# Patient Record
Sex: Male | Born: 1965 | Race: Black or African American | Hispanic: No | Marital: Married | Smoking: Current every day smoker
Health system: Southern US, Community
[De-identification: ages and names within clinical notes are randomized; demographics above are authoritative.]

## PROBLEM LIST (undated history)

## (undated) DIAGNOSIS — I509 Heart failure, unspecified: Secondary | ICD-10-CM

## (undated) DIAGNOSIS — I428 Other cardiomyopathies: Secondary | ICD-10-CM

## (undated) DIAGNOSIS — I10 Essential (primary) hypertension: Secondary | ICD-10-CM

## (undated) DIAGNOSIS — Z8249 Family history of ischemic heart disease and other diseases of the circulatory system: Secondary | ICD-10-CM

## (undated) DIAGNOSIS — E669 Obesity, unspecified: Secondary | ICD-10-CM

## (undated) HISTORY — PX: OTHER SURGICAL HISTORY: SHX169

## (undated) HISTORY — DX: Heart failure, unspecified: I50.9

---

## 2014-03-30 ENCOUNTER — Encounter (HOSPITAL_COMMUNITY): Payer: Self-pay | Admitting: Emergency Medicine

## 2014-03-30 ENCOUNTER — Emergency Department (HOSPITAL_COMMUNITY): Payer: BLUE CROSS/BLUE SHIELD

## 2014-03-30 ENCOUNTER — Emergency Department (HOSPITAL_COMMUNITY)
Admission: EM | Admit: 2014-03-30 | Discharge: 2014-03-30 | Disposition: A | Discharge status: Home / Self Care | Payer: BLUE CROSS/BLUE SHIELD | Attending: Emergency Medicine | Admitting: Emergency Medicine

## 2014-03-30 DIAGNOSIS — I10 Essential (primary) hypertension: Secondary | ICD-10-CM | POA: Insufficient documentation

## 2014-03-30 DIAGNOSIS — R05 Cough: Secondary | ICD-10-CM | POA: Diagnosis present

## 2014-03-30 DIAGNOSIS — Z720 Tobacco use: Secondary | ICD-10-CM | POA: Diagnosis not present

## 2014-03-30 DIAGNOSIS — J159 Unspecified bacterial pneumonia: Secondary | ICD-10-CM | POA: Diagnosis not present

## 2014-03-30 DIAGNOSIS — J189 Pneumonia, unspecified organism: Secondary | ICD-10-CM

## 2014-03-30 LAB — CBC WITH DIFFERENTIAL/PLATELET
Basophils Absolute: 0 10*3/uL (ref 0.0–0.1)
Basophils Relative: 1 % (ref 0–1)
EOS ABS: 0 10*3/uL (ref 0.0–0.7)
EOS PCT: 0 % (ref 0–5)
HEMATOCRIT: 52.9 % — AB (ref 39.0–52.0)
Hemoglobin: 18.9 g/dL — ABNORMAL HIGH (ref 13.0–17.0)
LYMPHS ABS: 1.1 10*3/uL (ref 0.7–4.0)
Lymphocytes Relative: 28 % (ref 12–46)
MCH: 29.2 pg (ref 26.0–34.0)
MCHC: 35.7 g/dL (ref 30.0–36.0)
MCV: 81.6 fL (ref 78.0–100.0)
MONOS PCT: 12 % (ref 3–12)
Monocytes Absolute: 0.5 10*3/uL (ref 0.1–1.0)
Neutro Abs: 2.4 10*3/uL (ref 1.7–7.7)
Neutrophils Relative %: 60 % (ref 43–77)
PLATELETS: 129 10*3/uL — AB (ref 150–400)
RBC: 6.48 MIL/uL — AB (ref 4.22–5.81)
RDW: 14 % (ref 11.5–15.5)
WBC: 4 10*3/uL (ref 4.0–10.5)

## 2014-03-30 LAB — BASIC METABOLIC PANEL
ANION GAP: 10 (ref 5–15)
BUN: 22 mg/dL (ref 6–23)
CHLORIDE: 98 mmol/L (ref 96–112)
CO2: 21 mmol/L (ref 19–32)
Calcium: 8.1 mg/dL — ABNORMAL LOW (ref 8.4–10.5)
Creatinine, Ser: 1.3 mg/dL (ref 0.50–1.35)
GFR calc non Af Amer: 64 mL/min — ABNORMAL LOW (ref >90)
GFR, EST AFRICAN AMERICAN: 74 mL/min — AB (ref >90)
Glucose, Bld: 108 mg/dL — ABNORMAL HIGH (ref 70–99)
POTASSIUM: 5.3 mmol/L — AB (ref 3.5–5.1)
Sodium: 129 mmol/L — ABNORMAL LOW (ref 135–145)

## 2014-03-30 LAB — PROTIME-INR
INR: 0.97 (ref 0.00–1.49)
PROTHROMBIN TIME: 13 s (ref 11.6–15.2)

## 2014-03-30 MED ORDER — GUAIFENESIN-CODEINE 100-10 MG/5ML PO SOLN
10.0000 mL | Freq: Once | ORAL | Status: AC
  Administered 2014-03-30: 10 mL via ORAL
  Filled 2014-03-30: qty 10

## 2014-03-30 MED ORDER — IBUPROFEN 800 MG PO TABS: 800.0000 mg | ORAL_TABLET | Freq: Three times a day (TID) | ORAL | Status: DC | PRN

## 2014-03-30 MED ORDER — IPRATROPIUM-ALBUTEROL 0.5-2.5 (3) MG/3ML IN SOLN
3.0000 mL | Freq: Once | RESPIRATORY_TRACT | Status: AC
  Administered 2014-03-30: 3 mL via RESPIRATORY_TRACT
  Filled 2014-03-30: qty 3

## 2014-03-30 MED ORDER — LEVOFLOXACIN IN D5W 500 MG/100ML IV SOLN
500.0000 mg | Freq: Once | INTRAVENOUS | Status: AC
  Administered 2014-03-30: 500 mg via INTRAVENOUS
  Filled 2014-03-30: qty 100

## 2014-03-30 MED ORDER — KETOROLAC TROMETHAMINE 30 MG/ML IJ SOLN
30.0000 mg | Freq: Once | INTRAMUSCULAR | Status: AC
  Administered 2014-03-30: 30 mg via INTRAVENOUS
  Filled 2014-03-30: qty 1

## 2014-03-30 MED ORDER — LOPERAMIDE HCL 2 MG PO CAPS
4.0000 mg | ORAL_CAPSULE | Freq: Once | ORAL | Status: DC
  Filled 2014-03-30: qty 2

## 2014-03-30 MED ORDER — SODIUM CHLORIDE 0.9 % IV BOLUS (SEPSIS)
1000.0000 mL | Freq: Once | INTRAVENOUS | Status: AC
  Administered 2014-03-30: 1000 mL via INTRAVENOUS

## 2014-03-30 MED ORDER — LEVOFLOXACIN 500 MG PO TABS: 500.0000 mg | ORAL_TABLET | Freq: Every day | ORAL | Status: DC

## 2014-03-30 MED ORDER — GUAIFENESIN-CODEINE 100-10 MG/5ML PO SOLN: 10.0000 mL | Freq: Four times a day (QID) | ORAL | Status: DC | PRN

## 2014-03-30 NOTE — ED Provider Notes (Signed)
TIME SEEN: 12:15 PM  CHIEF COMPLAINT: Subjective fever, chills, cough, diarrhea, body aches  HPI: Pt is a 49 y.o. male with history of hypertension, tobacco use who presents to the emergency department with 5 days of fever, cough that is blood streaked, diarrhea, body aches. States he is from [place] and has been here for the past 3 weeks. No recent international travel. No known sick contacts. Has had decreased appetite but no nausea or vomiting. Did not have an influenza vaccination. States today he had back pain with coughing and some mild shortness of breath. Does not work oxygen at home.  ROS: See HPI Constitutional:  fever  Eyes: no drainage  ENT: no runny nose   Cardiovascular:  no chest pain  Resp:  SOB  GI: no vomiting GU: no dysuria Integumentary: no rash  Allergy: no hives  Musculoskeletal: no leg swelling  Neurological: no slurred speech ROS otherwise negative  PAST MEDICAL HISTORY/PAST SURGICAL HISTORY:  Past Medical History  Diagnosis Date  . Hypertension     MEDICATIONS:  Prior to Admission medications   Not on File    ALLERGIES:  No Known Allergies  SOCIAL HISTORY:  History  Substance Use Topics  . Smoking status: Current Every Day Smoker  . Smokeless tobacco: Not on file  . Alcohol Use: No    FAMILY HISTORY: History reviewed. No pertinent family history.  EXAM: BP 120/76 mmHg  Pulse 93  Temp(Src) 100.8 F (38.2 C) (Oral)  Resp 18  SpO2 96% CONSTITUTIONAL: Alert and oriented and responds appropriately to questions. Well-appearing; well-nourished HEAD: Normocephalic EYES: Conjunctivae clear, PERRL ENT: normal nose; no rhinorrhea; moist mucous membranes; pharynx without lesions noted NECK: Supple, no meningismus, no LAD  CARD: RRR; S1 and S2 appreciated; no murmurs, no clicks, no rubs, no gallops RESP: Normal chest excursion without splinting or tachypnea; breath sounds equal bilaterally the patient does have some moderate patellar  wheezing, no rhonchi or rales, no hypoxia, no increased work of breathing, speaking full sentences ABD/GI: Normal bowel sounds; non-distended; soft, non-tender, no rebound, no guarding BACK:  The back appears normal and is non-tender to palpation, there is no CVA tenderness EXT: Normal ROM in all joints; non-tender to palpation; no edema; normal capillary refill; no cyanosis; no calf tenderness or swelling    SKIN: Normal color for age and race; warm, no rash NEURO: Moves all extremities equally, sensation to light touch intact diffusely, cranial nerves II through XII intact, no dysmetria to finger-nose testing, no ataxic gait PSYCH: The patient's mood and manner are appropriate. Grooming and personal hygiene are appropriate.  MEDICAL DECISION MAKING: Patient here with what appears to be multifocal pneumonia. He is febrile but otherwise hemodynamically stable. Will obtain labs, give IV fluids, Toradol, guaifenesin with codeine. Will treat with Levaquin. We'll give DuoNeb treatment as he is wheezing. We'll ambulate with pulse ox.  ED PROGRESS: Lungs are now clear to auscultation. Patient reports that his shaky feeling has improved. He still feels slightly wobbly on his feet but does not have an ataxic gait and is able to ambulate. Oxygen saturation does not drop with ambulation. Labs unremarkable. I feel patient is safe to be discharged home on Levaquin. He is comfortable with this plan. Discussed strict return precautions. He verbalizes understanding and is comfortable with plan.    EKG Interpretation  Date/Time:  Saturday March 30 2014 12:57:00 EST Ventricular Rate:  81 PR Interval:  142 QRS Duration: 82 QT Interval:  372 QTC Calculation: 432 R Axis:   -  72 Text Interpretation:  Sinus rhythm Ventricular trigeminy Inferior infarct, old Anterior infarct, old No old tracing to compare Confirmed by Sissi Padia,  DO, Beau Ramsburg [phone]) on 03/30/2014 1:04:19 PM        Layla Maw Denzell Colasanti, DO 03/30/14 1451

## 2014-03-30 NOTE — ED Notes (Signed)
Pt states that he has been having cough, fever, gen body aches, and diarrhea since Sunday.

## 2014-03-30 NOTE — ED Notes (Signed)
I ambulating patient O2 stat were 94 and heart rate 74.  Patient was very off balance when trying to walk.  I made MD aware.

## 2014-03-30 NOTE — Discharge Instructions (Signed)

## 2014-10-24 ENCOUNTER — Emergency Department (HOSPITAL_COMMUNITY): Payer: BLUE CROSS/BLUE SHIELD

## 2014-10-24 ENCOUNTER — Encounter (HOSPITAL_COMMUNITY): Payer: Self-pay | Admitting: Emergency Medicine

## 2014-10-24 ENCOUNTER — Inpatient Hospital Stay (HOSPITAL_COMMUNITY)
Admission: EM | Admit: 2014-10-24 | Discharge: 2014-10-26 | DRG: 287 | Disposition: A | Discharge status: Home / Self Care | Payer: BLUE CROSS/BLUE SHIELD | Attending: Cardiovascular Disease | Admitting: Cardiovascular Disease

## 2014-10-24 DIAGNOSIS — F17200 Nicotine dependence, unspecified, uncomplicated: Secondary | ICD-10-CM | POA: Diagnosis present

## 2014-10-24 DIAGNOSIS — R001 Bradycardia, unspecified: Secondary | ICD-10-CM | POA: Diagnosis not present

## 2014-10-24 DIAGNOSIS — Z6841 Body Mass Index (BMI) 40.0 and over, adult: Secondary | ICD-10-CM | POA: Diagnosis not present

## 2014-10-24 DIAGNOSIS — R0683 Snoring: Secondary | ICD-10-CM | POA: Diagnosis present

## 2014-10-24 DIAGNOSIS — Z720 Tobacco use: Secondary | ICD-10-CM

## 2014-10-24 DIAGNOSIS — I428 Other cardiomyopathies: Secondary | ICD-10-CM

## 2014-10-24 DIAGNOSIS — I10 Essential (primary) hypertension: Secondary | ICD-10-CM

## 2014-10-24 DIAGNOSIS — I200 Unstable angina: Secondary | ICD-10-CM | POA: Diagnosis not present

## 2014-10-24 DIAGNOSIS — I429 Cardiomyopathy, unspecified: Secondary | ICD-10-CM | POA: Diagnosis present

## 2014-10-24 DIAGNOSIS — I5020 Unspecified systolic (congestive) heart failure: Secondary | ICD-10-CM | POA: Diagnosis present

## 2014-10-24 DIAGNOSIS — I444 Left anterior fascicular block: Secondary | ICD-10-CM | POA: Diagnosis present

## 2014-10-24 DIAGNOSIS — R0789 Other chest pain: Secondary | ICD-10-CM | POA: Diagnosis not present

## 2014-10-24 DIAGNOSIS — R079 Chest pain, unspecified: Secondary | ICD-10-CM | POA: Diagnosis present

## 2014-10-24 DIAGNOSIS — Z8249 Family history of ischemic heart disease and other diseases of the circulatory system: Secondary | ICD-10-CM

## 2014-10-24 DIAGNOSIS — G4730 Sleep apnea, unspecified: Secondary | ICD-10-CM | POA: Diagnosis present

## 2014-10-24 DIAGNOSIS — I509 Heart failure, unspecified: Secondary | ICD-10-CM | POA: Diagnosis not present

## 2014-10-24 DIAGNOSIS — I495 Sick sinus syndrome: Secondary | ICD-10-CM | POA: Diagnosis not present

## 2014-10-24 HISTORY — DX: Obesity, unspecified: E66.9

## 2014-10-24 HISTORY — DX: Essential (primary) hypertension: I10

## 2014-10-24 HISTORY — DX: Family history of ischemic heart disease and other diseases of the circulatory system: Z82.49

## 2014-10-24 HISTORY — DX: Other cardiomyopathies: I42.8

## 2014-10-24 LAB — BASIC METABOLIC PANEL
ANION GAP: 9 (ref 5–15)
BUN: 9 mg/dL (ref 6–20)
CALCIUM: 8.9 mg/dL (ref 8.9–10.3)
CO2: 25 mmol/L (ref 22–32)
Chloride: 104 mmol/L (ref 101–111)
Creatinine, Ser: 1.24 mg/dL (ref 0.61–1.24)
GFR calc Af Amer: >60 mL/min (ref >60)
GLUCOSE: 96 mg/dL (ref 65–99)
Potassium: 4.1 mmol/L (ref 3.5–5.1)
SODIUM: 138 mmol/L (ref 135–145)

## 2014-10-24 LAB — COMPREHENSIVE METABOLIC PANEL
ALBUMIN: 3.8 g/dL (ref 3.5–5.0)
ALK PHOS: 85 U/L (ref 38–126)
ALT: 13 U/L — AB (ref 17–63)
AST: 21 U/L (ref 15–41)
Anion gap: 11 (ref 5–15)
BUN: 10 mg/dL (ref 6–20)
CALCIUM: 9.1 mg/dL (ref 8.9–10.3)
CHLORIDE: 104 mmol/L (ref 101–111)
CO2: 23 mmol/L (ref 22–32)
CREATININE: 1.38 mg/dL — AB (ref 0.61–1.24)
GFR calc non Af Amer: 59 mL/min — ABNORMAL LOW (ref >60)
GLUCOSE: 122 mg/dL — AB (ref 65–99)
Potassium: 3.9 mmol/L (ref 3.5–5.1)
Sodium: 138 mmol/L (ref 135–145)
Total Bilirubin: 0.8 mg/dL (ref 0.3–1.2)
Total Protein: 7.2 g/dL (ref 6.5–8.1)

## 2014-10-24 LAB — PROTIME-INR
INR: 1.08 (ref 0.00–1.49)
PROTHROMBIN TIME: 14.2 s (ref 11.6–15.2)

## 2014-10-24 LAB — CBC WITH DIFFERENTIAL/PLATELET
BASOS ABS: 0 10*3/uL (ref 0.0–0.1)
BASOS PCT: 0 %
EOS ABS: 0.1 10*3/uL (ref 0.0–0.7)
EOS PCT: 1 %
HCT: 52.6 % — ABNORMAL HIGH (ref 39.0–52.0)
HEMOGLOBIN: 18 g/dL — AB (ref 13.0–17.0)
LYMPHS ABS: 1.8 10*3/uL (ref 0.7–4.0)
Lymphocytes Relative: 28 %
MCH: 28.6 pg (ref 26.0–34.0)
MCHC: 34.2 g/dL (ref 30.0–36.0)
MCV: 83.5 fL (ref 78.0–100.0)
Monocytes Absolute: 0.4 10*3/uL (ref 0.1–1.0)
Monocytes Relative: 7 %
NEUTROS PCT: 64 %
Neutro Abs: 3.9 10*3/uL (ref 1.7–7.7)
PLATELETS: 182 10*3/uL (ref 150–400)
RBC: 6.3 MIL/uL — ABNORMAL HIGH (ref 4.22–5.81)
RDW: 14.5 % (ref 11.5–15.5)
WBC: 6.3 10*3/uL (ref 4.0–10.5)

## 2014-10-24 LAB — TROPONIN I
Troponin I: 0.03 ng/mL (ref <0.031)
Troponin I: 0.03 ng/mL (ref <0.031)

## 2014-10-24 LAB — TSH: TSH: 1.261 u[IU]/mL (ref 0.350–4.500)

## 2014-10-24 MED ORDER — ZOLPIDEM TARTRATE 5 MG PO TABS: 5.0000 mg | ORAL_TABLET | Freq: Every evening | ORAL | Status: DC | PRN

## 2014-10-24 MED ORDER — ASPIRIN EC 81 MG PO TBEC
81.0000 mg | DELAYED_RELEASE_TABLET | Freq: Every day | ORAL | Status: DC
Start: 2014-10-26 — End: 2014-10-26
  Administered 2014-10-26: 81 mg via ORAL
  Filled 2014-10-24: qty 1

## 2014-10-24 MED ORDER — SODIUM CHLORIDE 0.9 % IJ SOLN: 3.0000 mL | INTRAMUSCULAR | Status: DC | PRN

## 2014-10-24 MED ORDER — ACETAMINOPHEN 325 MG PO TABS
650.0000 mg | ORAL_TABLET | ORAL | Status: DC | PRN
  Administered 2014-10-25 – 2014-10-26 (×2): 650 mg via ORAL
  Filled 2014-10-24 (×2): qty 2

## 2014-10-24 MED ORDER — SODIUM CHLORIDE 0.9 % IV SOLN: 250.0000 mL | INTRAVENOUS | Status: DC | PRN

## 2014-10-24 MED ORDER — MORPHINE SULFATE (PF) 2 MG/ML IV SOLN
2.0000 mg | INTRAVENOUS | Status: DC | PRN
  Administered 2014-10-24: 2 mg via INTRAVENOUS
  Filled 2014-10-24: qty 1

## 2014-10-24 MED ORDER — NITROGLYCERIN IN D5W 200-5 MCG/ML-% IV SOLN
0.0000 ug/min | Freq: Once | INTRAVENOUS | Status: AC
  Administered 2014-10-24: 5 ug/min via INTRAVENOUS
  Filled 2014-10-24: qty 250

## 2014-10-24 MED ORDER — ALPRAZOLAM 0.25 MG PO TABS: 0.2500 mg | ORAL_TABLET | Freq: Two times a day (BID) | ORAL | Status: DC | PRN

## 2014-10-24 MED ORDER — CARVEDILOL 6.25 MG PO TABS
6.2500 mg | ORAL_TABLET | Freq: Two times a day (BID) | ORAL | Status: DC
  Administered 2014-10-25 – 2014-10-26 (×3): 6.25 mg via ORAL
  Filled 2014-10-24 (×3): qty 1

## 2014-10-24 MED ORDER — NITROGLYCERIN IN D5W 200-5 MCG/ML-% IV SOLN
3.0000 ug/min | INTRAVENOUS | Status: DC
  Administered 2014-10-24: 5 ug/min via INTRAVENOUS

## 2014-10-24 MED ORDER — ATORVASTATIN CALCIUM 40 MG PO TABS
40.0000 mg | ORAL_TABLET | Freq: Every day | ORAL | Status: DC
  Administered 2014-10-25 – 2014-10-26 (×2): 40 mg via ORAL
  Filled 2014-10-24 (×2): qty 1

## 2014-10-24 MED ORDER — HEPARIN (PORCINE) IN NACL 100-0.45 UNIT/ML-% IJ SOLN
1600.0000 [IU]/h | INTRAMUSCULAR | Status: DC
  Administered 2014-10-24: 1400 [IU]/h via INTRAVENOUS
  Administered 2014-10-25: 1600 [IU]/h via INTRAVENOUS
  Filled 2014-10-24 (×3): qty 250

## 2014-10-24 MED ORDER — ASPIRIN 81 MG PO CHEW
324.0000 mg | CHEWABLE_TABLET | Freq: Once | ORAL | Status: AC
  Administered 2014-10-24: 324 mg via ORAL
  Filled 2014-10-24: qty 4

## 2014-10-24 MED ORDER — ONDANSETRON HCL 4 MG/2ML IJ SOLN: 4.0000 mg | Freq: Four times a day (QID) | INTRAMUSCULAR | Status: DC | PRN

## 2014-10-24 MED ORDER — SODIUM CHLORIDE 0.9 % WEIGHT BASED INFUSION
3.0000 mL/kg/h | INTRAVENOUS | Status: DC
  Administered 2014-10-25: 3 mL/kg/h via INTRAVENOUS

## 2014-10-24 MED ORDER — SODIUM CHLORIDE 0.9 % IJ SOLN: 3.0000 mL | Freq: Two times a day (BID) | INTRAMUSCULAR | Status: DC

## 2014-10-24 MED ORDER — SODIUM CHLORIDE 0.9 % IV SOLN
INTRAVENOUS | Status: DC
  Administered 2014-10-24: 10 mL/h via INTRAVENOUS

## 2014-10-24 MED ORDER — SODIUM CHLORIDE 0.9 % WEIGHT BASED INFUSION
1.0000 mL/kg/h | INTRAVENOUS | Status: DC
  Administered 2014-10-25: 1 mL/kg/h via INTRAVENOUS

## 2014-10-24 MED ORDER — ASPIRIN 81 MG PO CHEW
81.0000 mg | CHEWABLE_TABLET | ORAL | Status: AC
  Administered 2014-10-25: 81 mg via ORAL
  Filled 2014-10-24: qty 1

## 2014-10-24 MED ORDER — NITROGLYCERIN 0.4 MG SL SUBL
0.4000 mg | SUBLINGUAL_TABLET | SUBLINGUAL | Status: DC | PRN
  Administered 2014-10-24 (×3): 0.4 mg via SUBLINGUAL
  Filled 2014-10-24: qty 1

## 2014-10-24 MED ORDER — NITROGLYCERIN 0.4 MG SL SUBL: 0.4000 mg | SUBLINGUAL_TABLET | SUBLINGUAL | Status: DC | PRN

## 2014-10-24 MED ORDER — HEPARIN BOLUS VIA INFUSION
4000.0000 [IU] | Freq: Once | INTRAVENOUS | Status: AC
  Administered 2014-10-24: 4000 [IU] via INTRAVENOUS
  Filled 2014-10-24: qty 4000

## 2014-10-24 NOTE — ED Notes (Addendum)
Pt c/o generalized weakness with some nausea; pt sts was upstairs with his brother who was in ICU; pt sts some pain in buttocks area over last couple of days

## 2014-10-24 NOTE — ED Notes (Signed)
Pt transported to xray 

## 2014-10-24 NOTE — ED Notes (Signed)
Report attempted nurse to call back.  

## 2014-10-24 NOTE — Progress Notes (Signed)
ANTICOAGULATION CONSULT NOTE - Initial Consult  Pharmacy Consult for Heparin Indication: chest pain/ACS  No Known Allergies  Patient Measurements: Height: 5\' 8"  (172.7 cm) (pt reported) Weight: (!) 300 lb 11.2 oz (136.397 kg) IBW/kg (Calculated) : 68.4 Heparin Dosing Weight: 100.7 kg  Vital Signs: Temp: 97.6 F (36.4 C) (10/06 1515) Temp Source: Oral (10/06 1515) BP: 135/72 mmHg (10/06 1800) Pulse Rate: 62 (10/06 1800)  Labs:  Recent Labs  10/24/14 1544 10/24/14 1810  HGB 18.0*  --   HCT 52.6*  --   PLT 182  --   LABPROT  --  14.2  INR  --  1.08  CREATININE 1.38*  --   TROPONINI 0.03  --     Estimated Creatinine Clearance: 88.5 mL/min (by C-G formula based on Cr of 1.38).   Medical History: Past Medical History  Diagnosis Date  . Obesity (BMI 30-39.9)   . Essential hypertension 10/24/2014  . Family history of premature CAD     Medications:   (Not in a hospital admission)  Assessment: 49 yo M w/ hx of HTN who came in with chest pain, diaphoresis, blurry vision, and weakness.  Trop 0.03, Plt 182, Hgb 18  Goal of Therapy:  Heparin level 0.3-0.7 units/ml Monitor platelets by anticoagulation protocol: Yes   Plan:  Give 4000 units bolus x 1 Start heparin infusion at 1400 units/hr Check anti-Xa level in 6 hours and daily while on heparin Continue to monitor H&H and platelets daily  Arcola Jansky, PharmD Clinical Pharmacy Resident Pager: [phone] 10/24/2014,6:59 PM

## 2014-10-24 NOTE — ED Provider Notes (Signed)
CSN: [id number]     Arrival date & time 10/24/14  1507 History   First MD Initiated Contact with Patient 10/24/14 1532     Chief Complaint  Patient presents with  . Weakness     (Consider location/radiation/quality/duration/timing/severity/associated sxs/prior Treatment) HPI Comments: Patient here complaining of sudden onset of generalized weakness with chest pressure. He had associated diaphoresis and dyspnea. Symptoms occur with rest. He endorses that he has been under great stress recently. Nausea without vomiting. Denies any prior history of CAD but does have a history of hypertension. Patient did ambulate and states that he feels as if the medicines symptoms worse although he said he did become very anxious. Denies any leg pain or swelling. No treatment used prior to arrival.  Patient is a 49 y.o. male presenting with weakness. The history is provided by the patient.  Weakness    Past Medical History  Diagnosis Date  . Hypertension    History reviewed. No pertinent past surgical history. History reviewed. No pertinent family history. Social History  Substance Use Topics  . Smoking status: Current Every Day Smoker  . Smokeless tobacco: None  . Alcohol Use: No    Review of Systems  Neurological: Positive for weakness.  All other systems reviewed and are negative.     Allergies  Review of patient's allergies indicates no known allergies.  Home Medications   Prior to Admission medications   Medication Sig Start Date End Date Taking? Authorizing Provider  guaiFENesin-codeine 100-10 MG/5ML syrup Take 10 mLs by mouth every 6 (six) hours as needed for cough. 03/30/14   Kristen N Ward, DO  ibuprofen (ADVIL,MOTRIN) 800 MG tablet Take 1 tablet (800 mg total) by mouth every 8 (eight) hours as needed for mild pain. 03/30/14   Kristen N Ward, DO  levofloxacin (LEVAQUIN) 500 MG tablet Take 1 tablet (500 mg total) by mouth daily. 03/30/14   Kristen N Ward, DO   BP 143/95 mmHg   Pulse 72  Temp(Src) 97.6 F (36.4 C) (Oral)  Resp 20  Wt 300 lb 11.2 oz (136.397 kg)  SpO2 93% Physical Exam  Constitutional: He is oriented to person, place, and time. He appears well-developed and well-nourished.  Non-toxic appearance. No distress.  HENT:  Head: Normocephalic and atraumatic.  Eyes: Conjunctivae, EOM and lids are normal. Pupils are equal, round, and reactive to light.  Neck: Normal range of motion. Neck supple. No tracheal deviation present. No thyroid mass present.  Cardiovascular: Normal rate, regular rhythm and normal heart sounds.  Exam reveals no gallop.   No murmur heard. Pulmonary/Chest: Effort normal and breath sounds normal. No stridor. No respiratory distress. He has no decreased breath sounds. He has no wheezes. He has no rhonchi. He has no rales.  Abdominal: Soft. Normal appearance and bowel sounds are normal. He exhibits no distension. There is no tenderness. There is no rebound and no CVA tenderness.  Musculoskeletal: Normal range of motion. He exhibits no edema or tenderness.  Neurological: He is alert and oriented to person, place, and time. He has normal strength. No cranial nerve deficit or sensory deficit. GCS eye subscore is 4. GCS verbal subscore is 5. GCS motor subscore is 6.  Skin: Skin is warm and dry. No abrasion and no rash noted.  Psychiatric: He has a normal mood and affect. His speech is normal and behavior is normal.  Nursing note and vitals reviewed.   ED Course  Procedures (including critical care time) Labs Review Labs Reviewed  CBC WITH  DIFFERENTIAL/PLATELET  COMPREHENSIVE METABOLIC PANEL  TROPONIN I    Imaging Review No results found. I have personally reviewed and evaluated these images and lab results as part of my medical decision-making.   EKG Interpretation   Date/Time:  Thursday October 24 2014 15:29:45 EDT Ventricular Rate:  70 PR Interval:  142 QRS Duration: 98 QT Interval:  422 QTC Calculation: 455 R Axis:    -55 Text Interpretation:  Normal sinus rhythm Left anterior fascicular block  Minimal voltage criteria for LVH, may be normal variant Cannot rule out  Anterior infarct , age undetermined Abnormal ECG Confirmed by Ludmila Ebarb  MD,  Yamaris Cummings ([id number]) on 10/24/2014 3:32:31 PM      MDM   Final diagnoses:  Chest pain    Patient placed on nitroglycerin drip after responding well to supplemental nitroglycerin. Given aspirin as well 2. Consult cardiology obtained and patient will be admitted for cardiac workup    Lorre Nick, MD 10/24/14 1731
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
## 2014-10-24 NOTE — ED Notes (Signed)
Cardiology at bedside.

## 2014-10-25 ENCOUNTER — Encounter (HOSPITAL_COMMUNITY): Payer: Self-pay | Admitting: *Deleted

## 2014-10-25 ENCOUNTER — Encounter (HOSPITAL_COMMUNITY)
Admission: EM | Disposition: A | Discharge status: Home / Self Care | Payer: Self-pay | Source: Home / Self Care | Attending: Cardiovascular Disease

## 2014-10-25 ENCOUNTER — Inpatient Hospital Stay (HOSPITAL_COMMUNITY): Payer: BLUE CROSS/BLUE SHIELD

## 2014-10-25 DIAGNOSIS — I200 Unstable angina: Secondary | ICD-10-CM | POA: Diagnosis present

## 2014-10-25 DIAGNOSIS — I509 Heart failure, unspecified: Secondary | ICD-10-CM

## 2014-10-25 DIAGNOSIS — I495 Sick sinus syndrome: Secondary | ICD-10-CM

## 2014-10-25 DIAGNOSIS — I428 Other cardiomyopathies: Secondary | ICD-10-CM

## 2014-10-25 HISTORY — PX: CARDIAC CATHETERIZATION: SHX172

## 2014-10-25 LAB — GLUCOSE, CAPILLARY: GLUCOSE-CAPILLARY: 123 mg/dL — AB (ref 65–99)

## 2014-10-25 LAB — CBC
HEMATOCRIT: 51 % (ref 39.0–52.0)
Hemoglobin: 17.4 g/dL — ABNORMAL HIGH (ref 13.0–17.0)
MCH: 28.4 pg (ref 26.0–34.0)
MCHC: 34.1 g/dL (ref 30.0–36.0)
MCV: 83.3 fL (ref 78.0–100.0)
PLATELETS: 168 10*3/uL (ref 150–400)
RBC: 6.12 MIL/uL — AB (ref 4.22–5.81)
RDW: 14.7 % (ref 11.5–15.5)
WBC: 6.9 10*3/uL (ref 4.0–10.5)

## 2014-10-25 LAB — TROPONIN I

## 2014-10-25 LAB — LIPID PANEL
CHOLESTEROL: 179 mg/dL (ref 0–200)
HDL: 41 mg/dL (ref >40)
LDL Cholesterol: 127 mg/dL — ABNORMAL HIGH (ref 0–99)
Total CHOL/HDL Ratio: 4.4 RATIO
Triglycerides: 57 mg/dL (ref <150)
VLDL: 11 mg/dL (ref 0–40)

## 2014-10-25 LAB — HEMOGLOBIN A1C
Hgb A1c MFr Bld: 6.3 % — ABNORMAL HIGH (ref 4.8–5.6)
MEAN PLASMA GLUCOSE: 134 mg/dL

## 2014-10-25 LAB — HEPARIN LEVEL (UNFRACTIONATED)
HEPARIN UNFRACTIONATED: 0.24 [IU]/mL — AB (ref 0.30–0.70)
Heparin Unfractionated: 0.45 IU/mL (ref 0.30–0.70)

## 2014-10-25 SURGERY — LEFT HEART CATH AND CORONARY ANGIOGRAPHY
Anesthesia: LOCAL

## 2014-10-25 MED ORDER — MIDAZOLAM HCL 2 MG/2ML IJ SOLN
INTRAMUSCULAR | Status: AC
  Filled 2014-10-25: qty 4

## 2014-10-25 MED ORDER — HEPARIN (PORCINE) IN NACL 2-0.9 UNIT/ML-% IJ SOLN
INTRAMUSCULAR | Status: AC
  Filled 2014-10-25: qty 1000

## 2014-10-25 MED ORDER — LIDOCAINE HCL (PF) 1 % IJ SOLN
INTRAMUSCULAR | Status: AC
  Filled 2014-10-25: qty 30

## 2014-10-25 MED ORDER — FENTANYL CITRATE (PF) 100 MCG/2ML IJ SOLN
INTRAMUSCULAR | Status: AC
  Filled 2014-10-25: qty 4

## 2014-10-25 MED ORDER — HEPARIN SODIUM (PORCINE) 1000 UNIT/ML IJ SOLN
INTRAMUSCULAR | Status: DC | PRN
  Administered 2014-10-25: 6000 [IU] via INTRAVENOUS

## 2014-10-25 MED ORDER — HEPARIN SODIUM (PORCINE) 1000 UNIT/ML IJ SOLN
INTRAMUSCULAR | Status: AC
Start: 2014-10-25 — End: 2014-10-25
  Filled 2014-10-25: qty 1

## 2014-10-25 MED ORDER — MIDAZOLAM HCL 2 MG/2ML IJ SOLN
INTRAMUSCULAR | Status: DC | PRN
  Administered 2014-10-25: 1 mg via INTRAVENOUS

## 2014-10-25 MED ORDER — CARVEDILOL 6.25 MG PO TABS: 6.2500 mg | ORAL_TABLET | Freq: Two times a day (BID) | ORAL | Status: DC

## 2014-10-25 MED ORDER — HEPARIN BOLUS VIA INFUSION
1500.0000 [IU] | Freq: Once | INTRAVENOUS | Status: AC
  Administered 2014-10-25: 1500 [IU] via INTRAVENOUS
  Filled 2014-10-25: qty 1500

## 2014-10-25 MED ORDER — SODIUM CHLORIDE 0.9 % IJ SOLN: 3.0000 mL | INTRAMUSCULAR | Status: DC | PRN

## 2014-10-25 MED ORDER — SODIUM CHLORIDE 0.9 % IJ SOLN
3.0000 mL | Freq: Two times a day (BID) | INTRAMUSCULAR | Status: DC
  Administered 2014-10-25 – 2014-10-26 (×2): 3 mL via INTRAVENOUS

## 2014-10-25 MED ORDER — ASPIRIN 81 MG PO TBEC: 81.0000 mg | DELAYED_RELEASE_TABLET | Freq: Every day | ORAL | Status: DC

## 2014-10-25 MED ORDER — HEPARIN SODIUM (PORCINE) 5000 UNIT/ML IJ SOLN
5000.0000 [IU] | Freq: Three times a day (TID) | INTRAMUSCULAR | Status: DC
  Administered 2014-10-25 – 2014-10-26 (×3): 5000 [IU] via SUBCUTANEOUS
  Filled 2014-10-25 (×3): qty 1

## 2014-10-25 MED ORDER — VERAPAMIL HCL 2.5 MG/ML IV SOLN
INTRAVENOUS | Status: AC
  Filled 2014-10-25: qty 2

## 2014-10-25 MED ORDER — SODIUM CHLORIDE 0.9 % IV SOLN: 250.0000 mL | INTRAVENOUS | Status: DC | PRN

## 2014-10-25 MED ORDER — NITROGLYCERIN 1 MG/10 ML FOR IR/CATH LAB
INTRA_ARTERIAL | Status: AC
  Filled 2014-10-25: qty 10

## 2014-10-25 MED ORDER — FENTANYL CITRATE (PF) 100 MCG/2ML IJ SOLN
INTRAMUSCULAR | Status: DC | PRN
  Administered 2014-10-25: 50 ug via INTRAVENOUS

## 2014-10-25 MED ORDER — LISINOPRIL 2.5 MG PO TABS: 2.5000 mg | ORAL_TABLET | Freq: Every day | ORAL | Status: DC

## 2014-10-25 MED ORDER — SODIUM CHLORIDE 0.9 % IV SOLN: INTRAVENOUS | Status: AC

## 2014-10-25 MED ORDER — LIDOCAINE HCL (PF) 1 % IJ SOLN
INTRAMUSCULAR | Status: DC | PRN
  Administered 2014-10-25: 14:00:00

## 2014-10-25 MED ORDER — VERAPAMIL HCL 2.5 MG/ML IV SOLN
INTRAVENOUS | Status: DC | PRN
  Administered 2014-10-25: 14:00:00 via INTRA_ARTERIAL

## 2014-10-25 MED ORDER — ACETAMINOPHEN 325 MG PO TABS: 650.0000 mg | ORAL_TABLET | ORAL | Status: DC | PRN

## 2014-10-25 SURGICAL SUPPLY — 9 items
CATH INFINITI 5 FR JL3.5 (CATHETERS) ×2 IMPLANT
CATH INFINITI JR4 5F (CATHETERS) ×2 IMPLANT
DEVICE RAD COMP TR BAND LRG (VASCULAR PRODUCTS) ×2 IMPLANT
GLIDESHEATH SLEND A-KIT 6F 22G (SHEATH) ×2 IMPLANT
KIT HEART LEFT (KITS) ×2 IMPLANT
PACK CARDIAC CATHETERIZATION (CUSTOM PROCEDURE TRAY) ×2 IMPLANT
TRANSDUCER W/STOPCOCK (MISCELLANEOUS) ×2 IMPLANT
TUBING CIL FLEX 10 FLL-RA (TUBING) ×2 IMPLANT
WIRE SAFE-T 1.5MM-J .035X260CM (WIRE) ×2 IMPLANT
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
## 2014-10-25 NOTE — Progress Notes (Addendum)
Pt back from cath lab at 1435; VSS, telemetry reapplied and verified; IV intact and infusing; right radial TR band on with 10cc air per report from cath lab. Site level 0, no active bleeding, bruising or hematoma noted. Pt educated on RUE precautions and not to bend or twist which pt voices understanding. Will closely monitor per protocol and order. Will report off to oncoming RN. Kenneth Pain Jaquayla Hege Rn

## 2014-10-25 NOTE — Progress Notes (Signed)
ANTICOAGULATION CONSULT NOTE - Follow-up  Pharmacy Consult for Heparin Indication: chest pain/ACS  No Known Allergies  Patient Measurements: Height: 5\' 8"  (172.7 cm) (pt reported) Weight: 296 lb 11.2 oz (134.582 kg) IBW/kg (Calculated) : 68.4 Heparin Dosing Weight: 100.7 kg  Vital Signs: Temp: 98.2 F (36.8 C) (10/07 0531) Temp Source: Oral (10/07 0531) BP: 157/94 mmHg (10/07 0531) Pulse Rate: 56 (10/07 0531)  Labs:  Recent Labs  10/24/14 1544 10/24/14 1810 10/25/14 0014 10/25/14 0122 10/25/14 0525  HGB 18.0*  --   --  17.4*  --   HCT 52.6*  --   --  51.0  --   PLT 182  --   --  168  --   LABPROT  --  14.2  --   --   --   INR  --  1.08  --   --   --   HEPARINUNFRC  --   --   --  0.24*  --   CREATININE 1.38* 1.24  --   --   --   TROPONINI 0.03 0.03 <0.03  --  <0.03    Estimated Creatinine Clearance: 97.8 mL/min (by C-G formula based on Cr of 1.24).  Assessment: 49 yo M w/ hx of HTN who came in with chest pain, diaphoresis, blurry vision, and weakness and started on heparin for ACS/CP (no anticoag pta).  HL now therapeutic (0.46) x 1 on 1600 units/h, Hg 17.4, plts wnl, no bleeding noted. To go for cath 10/7 afternoon  Goal of Therapy:  Heparin level 0.3-0.7 units/ml Monitor platelets by anticoagulation protocol: Yes   Plan:  Heparin at 1600 units/hr 6h HL to confirm, daily HL/CBC Mon s/sx bleeding Cath this afternoon   Babs Bertin, PharmD Clinical Pharmacist Pager [phone] 10/25/2014 8:45 AM

## 2014-10-25 NOTE — Progress Notes (Signed)
Patient Name: Kenneth Gould Date of Encounter: 10/25/2014   SUBJECTIVE  Mild left upper chest discomfort this morning. No SOB or palpitations. For cath today.   CURRENT MEDS . [START ON 10/26/2014] aspirin EC  81 mg Oral Daily  . atorvastatin  40 mg Oral q1800  . carvedilol  6.25 mg Oral BID WC  . sodium chloride  3 mL Intravenous Q12H  . sodium chloride  3 mL Intravenous Q12H    OBJECTIVE  Filed Vitals:   10/24/14 1942 10/25/14 0100 10/25/14 0531 10/25/14 0556  BP: 113/64 128/98 157/94   Pulse: 52  56   Temp: 97.6 F (36.4 C)  98.2 F (36.8 C)   TempSrc: Oral  Oral   Resp: 18  18   Height:      Weight: 297 lb 9.6 oz (134.99 kg)   296 lb 11.2 oz (134.582 kg)  SpO2: 96%  96%    No intake or output data in the 24 hours ending 10/25/14 0844 Filed Weights   10/24/14 1515 10/24/14 1942 10/25/14 0556  Weight: 300 lb 11.2 oz (136.397 kg) 297 lb 9.6 oz (134.99 kg) 296 lb 11.2 oz (134.582 kg)    PHYSICAL EXAM  General: Pleasant, NAD. Neuro: Alert and oriented X 3. Moves all extremities spontaneously. Psych: Normal affect. HEENT:  Normal  Neck: Supple without bruits or JVD. Lungs:  Resp regular and unlabored, CTA. Heart: RRR no s3, s4, or murmurs. Abdomen: Soft, non-tender, non-distended, BS + x 4.  Extremities: No clubbing, cyanosis or edema. DP/PT/Radials 2+ and equal bilaterally.  Accessory Clinical Findings  CBC  Recent Labs  10/24/14 1544 10/25/14 0122  WBC 6.3 6.9  NEUTROABS 3.9  --   HGB 18.0* 17.4*  HCT 52.6* 51.0  MCV 83.5 83.3  PLT 182 168   Basic Metabolic Panel  Recent Labs  10/24/14 1544 10/24/14 1810  NA 138 138  K 3.9 4.1  CL 104 104  CO2 23 25  GLUCOSE 122* 96  BUN 10 9  CREATININE 1.38* 1.24  CALCIUM 9.1 8.9   Liver Function Tests  Recent Labs  10/24/14 1544  AST 21  ALT 13*  ALKPHOS 85  BILITOT 0.8  PROT 7.2  ALBUMIN 3.8   No results for input(s): LIPASE, AMYLASE in the last 72 hours. Cardiac Enzymes  Recent  Labs  10/24/14 1810 10/25/14 0014 10/25/14 0525  TROPONINI 0.03 <0.03 <0.03   Hemoglobin A1C  Recent Labs  10/24/14 1810  HGBA1C 6.3*   Fasting Lipid Panel  Recent Labs  10/25/14 0122  CHOL 179  HDL 41  LDLCALC 127*  TRIG 57  CHOLHDL 4.4   Thyroid Function Tests  Recent Labs  10/24/14 1810  TSH 1.261    TELE  Sinus bradycardia  Radiology/Studies  Dg Chest 2 View  10/24/2014   CLINICAL DATA:  c/o generalized weakness with some nausea; pt states was upstairs with his brother who was in ICU when sx worsened and he decided he needed to be seen. Hx: htn, pt is a everyday casual smoker. Denies fever or other illness. Increased stress and sleep deprivation.  EXAM: CHEST  2 VIEW  COMPARISON:  03/29/2014  FINDINGS: Heart size is normal. Lungs are clear. No pulmonary edema. No pleural effusions. Visualized osseous structures have a normal appearance.  IMPRESSION: No active cardiopulmonary disease.   Electronically Signed   By: Norva Pavlov M.D.   On: 10/24/2014 16:17    ASSESSMENT AND PLAN  1. Chest pain with moderate risk  for cardiac etiology - Trop x 3 negative. EKG without acute abnormality. For cath today.  - continue IV heparin and nitro gtt  -10/25/2014: Cholesterol 179; HDL 41; LDL Cholesterol 127*; Triglycerides 57; VLDL 11 -continue lipitor 40mg    2. Essential hypertension - BP running high this morning. - Continue to observe  3. Sinus bradycardia - Tele with rate of mid 40s to 50s after initiation of BB. - Adjust after cath  4. Snoring - possible sleep apnea - Outpatient sleep study  5. Morbid obesity -Body mass index is 45.12 kg/(m^2).  - L0st 22lb in last 3 months through diet and exercise. Encouraged to continue life style modifications.   6. Tobacco smoking - Encouraged smoking cessation  Signed, Bhagat,Bhavinkumar PA-C Pager [phone]  Patient seen, examined. Available data reviewed. Agree with findings, assessment, and plan as outlined  by Chelsea Aus, PA-C. Patient with no new cardiac c/o. No CP on IV heparin and NTG. Plans for cath possible PCI today. No additions to make from admission note yesterday.  Tonny Bollman, M.D. 10/25/2014 12:03 PM

## 2014-10-25 NOTE — Progress Notes (Signed)
  Echocardiogram 2D Echocardiogram has been performed.  Kenneth Gould M 10/25/2014, 5:29 PM

## 2014-10-25 NOTE — Research (Signed)
CADLAD Informed Consent   Subject Name: Kenneth Gould  Subject met inclusion and exclusion criteria.  The informed consent form, study requirements and expectations were reviewed with the subject and questions and concerns were addressed prior to the signing of the consent form.  The subject verbalized understanding of the trail requirements.  The subject agreed to participate in the CADLAD trial and signed the informed consent.  The informed consent was obtained prior to performance of any protocol-specific procedures for the subject.  A copy of the signed informed consent was given to the subject and a copy was placed in the subject's medical record.  Sandie Ano 10/25/2014,11:30

## 2014-10-25 NOTE — Progress Notes (Signed)
ANTICOAGULATION CONSULT NOTE - Follow-up  Pharmacy Consult for Heparin Indication: chest pain/ACS  No Known Allergies  Patient Measurements: Height: 5\' 8"  (172.7 cm) (pt reported) Weight: 297 lb 9.6 oz (134.99 kg) IBW/kg (Calculated) : 68.4 Heparin Dosing Weight: 100.7 kg  Vital Signs: Temp: 97.6 F (36.4 C) (10/06 1942) Temp Source: Oral (10/06 1942) BP: 128/98 mmHg (10/07 0100) Pulse Rate: 52 (10/06 1942)  Labs:  Recent Labs  10/24/14 1544 10/24/14 1810 10/25/14 0014 10/25/14 0122  HGB 18.0*  --   --  17.4*  HCT 52.6*  --   --  51.0  PLT 182  --   --  168  LABPROT  --  14.2  --   --   INR  --  1.08  --   --   HEPARINUNFRC  --   --   --  0.24*  CREATININE 1.38* 1.24  --   --   TROPONINI 0.03 0.03 <0.03  --     Estimated Creatinine Clearance: 97.9 mL/min (by C-G formula based on Cr of 1.24).  Assessment: 49 yo M w/ hx of HTN who came in with chest pain, diaphoresis, blurry vision, and weakness and started on heparin. Initial heparin level is slightly low at 0.24. No bleeding noted.   Goal of Therapy:  Heparin level 0.3-0.7 units/ml Monitor platelets by anticoagulation protocol: Yes   Plan:  - Heparin bolus 1500 units IV x 1  - Increase heparin gtt to 1600 units/hr - Check a 6 hour heparin level  Lysle Pearl, PharmD, BCPS Pager # [phone] 10/25/2014 2:33 AM

## 2014-10-25 NOTE — Discharge Instructions (Signed)

## 2014-10-25 NOTE — Progress Notes (Signed)
Pt transported off unit to cath lab for procedure. P. Amo Laraya Pestka RN 

## 2014-10-26 DIAGNOSIS — R0789 Other chest pain: Secondary | ICD-10-CM

## 2014-10-26 DIAGNOSIS — R079 Chest pain, unspecified: Secondary | ICD-10-CM | POA: Insufficient documentation

## 2014-10-26 LAB — CBC
HEMATOCRIT: 49.4 % (ref 39.0–52.0)
HEMOGLOBIN: 16.4 g/dL (ref 13.0–17.0)
MCH: 27.8 pg (ref 26.0–34.0)
MCHC: 33.2 g/dL (ref 30.0–36.0)
MCV: 83.9 fL (ref 78.0–100.0)
PLATELETS: 167 10*3/uL (ref 150–400)
RBC: 5.89 MIL/uL — AB (ref 4.22–5.81)
RDW: 14.7 % (ref 11.5–15.5)
WBC: 5.7 10*3/uL (ref 4.0–10.5)

## 2014-10-26 MED ORDER — LISINOPRIL 2.5 MG PO TABS
2.5000 mg | ORAL_TABLET | Freq: Every day | ORAL | Status: DC
  Administered 2014-10-26: 2.5 mg via ORAL
  Filled 2014-10-26: qty 1

## 2014-10-26 MED ORDER — CARVEDILOL 3.125 MG PO TABS: 3.1250 mg | ORAL_TABLET | Freq: Two times a day (BID) | ORAL | Status: DC

## 2014-10-26 MED ORDER — CARVEDILOL 3.125 MG PO TABS
3.1250 mg | ORAL_TABLET | Freq: Two times a day (BID) | ORAL | Status: DC
  Administered 2014-10-26: 3.125 mg via ORAL
  Filled 2014-10-26: qty 1

## 2014-10-26 MED ORDER — FAMOTIDINE 20 MG PO TABS
20.0000 mg | ORAL_TABLET | Freq: Two times a day (BID) | ORAL | Status: DC
  Administered 2014-10-26: 20 mg via ORAL
  Filled 2014-10-26: qty 1

## 2014-10-26 MED ORDER — FAMOTIDINE 20 MG PO TABS: 20.0000 mg | ORAL_TABLET | Freq: Two times a day (BID) | ORAL | Status: DC

## 2014-10-26 NOTE — Progress Notes (Signed)
Pt discharge education and instructions completed with pt and spouse at bedside; both voices understanding and denies any questions. Pt IV and telemetry removed; pt discharge home with spouse to transport him home. Pt to pick up electronically sent prescriptions from preferred pharmacy on file. Pt offered wheelchair but declines and ambulated off unit with spouse and belongings to the side. Arabella Merles Gionni Freese RN.

## 2014-10-26 NOTE — Progress Notes (Signed)
Patient ID: Kenneth Gould, male   DOB: [birth year], 49 y.o.   MRN: [id number]     Subjective:    Episode of diaphoresis and weakness last night  Objective:   Temp:  [97.5 F (36.4 C)-98.3 F (36.8 C)] 98.3 F (36.8 C) (10/08 0830) Pulse Rate:  [0-77] 67 (10/08 0830) Resp:  [0-30] 18 (10/08 0830) BP: (126-158)/(76-103) 127/89 mmHg (10/08 0830) SpO2:  [0 %-100 %] 96 % (10/08 0830) Last BM Date: 10/24/14 (10/24/2014)  Filed Weights   10/24/14 1515 10/24/14 1942 10/25/14 0556  Weight: 300 lb 11.2 oz (136.397 kg) 297 lb 9.6 oz (134.99 kg) 296 lb 11.2 oz (134.582 kg)    Intake/Output Summary (Last 24 hours) at 10/26/14 0915 Last data filed at 10/25/14 1700  Gross per 24 hour  Intake    360 ml  Output      0 ml  Net    360 ml    Telemetry: SR, Sinus brady, PVCs  Exam:  General: NAD  Resp: CTAB  Cardiac: RRR, no m/r/g, no jvd  GI: abdomen soft, NT, ND  MSK: no LE edema  Neuro: no focal deficits  Psych: appropriate affect  Lab Results:  Basic Metabolic Panel:  Recent Labs Lab 10/24/14 1544 10/24/14 1810  NA 138 138  K 3.9 4.1  CL 104 104  CO2 23 25  GLUCOSE 122* 96  BUN 10 9  CREATININE 1.38* 1.24  CALCIUM 9.1 8.9    Liver Function Tests:  Recent Labs Lab 10/24/14 1544  AST 21  ALT 13*  ALKPHOS 85  BILITOT 0.8  PROT 7.2  ALBUMIN 3.8    CBC:  Recent Labs Lab 10/24/14 1544 10/25/14 0122 10/26/14 0337  WBC 6.3 6.9 5.7  HGB 18.0* 17.4* 16.4  HCT 52.6* 51.0 49.4  MCV 83.5 83.3 83.9  PLT 182 168 167    Cardiac Enzymes:  Recent Labs Lab 10/24/14 1810 10/25/14 0014 10/25/14 0525  TROPONINI 0.03 <0.03 <0.03    BNP: No results for input(s): PROBNP in the last 8760 hours.  Coagulation:  Recent Labs Lab 10/24/14 1810  INR 1.08    ECG:   Medications:   Scheduled Medications: . aspirin EC  81 mg Oral Daily  . atorvastatin  40 mg Oral q1800  . carvedilol  6.25 mg Oral BID WC  . heparin  5,000 Units Subcutaneous 3 times  per day  . sodium chloride  3 mL Intravenous Q12H     Infusions:     PRN Medications:  sodium chloride, acetaminophen, ALPRAZolam, morphine injection, ondansetron (ZOFRAN) IV, sodium chloride, zolpidem     Assessment/Plan    1. Chest pain - troponins neg. - cath 10/25/14 with normal coronaries, LVEF 35-45% by LVgram,echo is pending. LVEDP of 13 - started on coreg 6.25mg  bid. Start lisinopril 2.5mg  daily.  - was planned for discharge last night but had episode of weakness and diaphoresis. No arrhythmias noted on telemery other than sinus brady and occasional PVCs, sinus brady primarily in early AM hours presumably while sleeping - start emperic H2 blocker for ches tpain.   2. Bradycardia - noted overnight primarily in early AM hours presumably while sleeping.  - currently awake and rates in 70s. - will decrease coreg to 3.12mg  bid - he needs outpatient sleep study. +snoring, +apneic episodes.     Will monitor this AM and f/u echo results. If ok plan for discharge later today.      Dina Rich, M.D.

## 2014-10-26 NOTE — Progress Notes (Signed)
Pt became bradycardic this morning, heart rate dropped to 40 beats per minute, pt reports no pain, asymptomatic and resting, pt heart rate increased to 60 beats per minute after waking up.  RN will continue to monitor, pt call bell in reach and bed in lowest position.  10/26/2014  Mila Palmer, RN

## 2015-01-22 ENCOUNTER — Encounter (HOSPITAL_COMMUNITY): Payer: Self-pay | Admitting: *Deleted

## 2015-01-22 ENCOUNTER — Emergency Department (HOSPITAL_COMMUNITY)
Admission: EM | Admit: 2015-01-22 | Discharge: 2015-01-23 | Disposition: A | Discharge status: Home / Self Care | Payer: BLUE CROSS/BLUE SHIELD | Attending: Emergency Medicine | Admitting: Emergency Medicine

## 2015-01-22 ENCOUNTER — Emergency Department (HOSPITAL_COMMUNITY): Payer: BLUE CROSS/BLUE SHIELD

## 2015-01-22 DIAGNOSIS — F17210 Nicotine dependence, cigarettes, uncomplicated: Secondary | ICD-10-CM | POA: Insufficient documentation

## 2015-01-22 DIAGNOSIS — R079 Chest pain, unspecified: Secondary | ICD-10-CM | POA: Diagnosis not present

## 2015-01-22 DIAGNOSIS — Z9889 Other specified postprocedural states: Secondary | ICD-10-CM | POA: Insufficient documentation

## 2015-01-22 DIAGNOSIS — E669 Obesity, unspecified: Secondary | ICD-10-CM | POA: Insufficient documentation

## 2015-01-22 DIAGNOSIS — I10 Essential (primary) hypertension: Secondary | ICD-10-CM | POA: Diagnosis not present

## 2015-01-22 DIAGNOSIS — R519 Headache, unspecified: Secondary | ICD-10-CM

## 2015-01-22 DIAGNOSIS — R51 Headache: Secondary | ICD-10-CM | POA: Insufficient documentation

## 2015-01-22 DIAGNOSIS — Z79899 Other long term (current) drug therapy: Secondary | ICD-10-CM | POA: Insufficient documentation

## 2015-01-22 DIAGNOSIS — Z7982 Long term (current) use of aspirin: Secondary | ICD-10-CM | POA: Diagnosis not present

## 2015-01-22 LAB — BASIC METABOLIC PANEL
ANION GAP: 8 (ref 5–15)
BUN: 6 mg/dL (ref 6–20)
CO2: 25 mmol/L (ref 22–32)
Calcium: 9.6 mg/dL (ref 8.9–10.3)
Chloride: 105 mmol/L (ref 101–111)
Creatinine, Ser: 0.87 mg/dL (ref 0.61–1.24)
GFR calc Af Amer: >60 mL/min (ref >60)
Glucose, Bld: 117 mg/dL — ABNORMAL HIGH (ref 65–99)
POTASSIUM: 4.1 mmol/L (ref 3.5–5.1)
SODIUM: 138 mmol/L (ref 135–145)

## 2015-01-22 LAB — CBC
HEMATOCRIT: 50.9 % (ref 39.0–52.0)
HEMOGLOBIN: 17.5 g/dL — AB (ref 13.0–17.0)
MCH: 28.5 pg (ref 26.0–34.0)
MCHC: 34.4 g/dL (ref 30.0–36.0)
MCV: 82.9 fL (ref 78.0–100.0)
Platelets: 173 10*3/uL (ref 150–400)
RBC: 6.14 MIL/uL — AB (ref 4.22–5.81)
RDW: 14.7 % (ref 11.5–15.5)
WBC: 7.2 10*3/uL (ref 4.0–10.5)

## 2015-01-22 LAB — I-STAT TROPONIN, ED
TROPONIN I, POC: 0.01 ng/mL (ref 0.00–0.08)
Troponin i, poc: 0.02 ng/mL (ref 0.00–0.08)

## 2015-01-22 MED ORDER — KETOROLAC TROMETHAMINE 30 MG/ML IJ SOLN
30.0000 mg | Freq: Once | INTRAMUSCULAR | Status: AC
  Administered 2015-01-22: 30 mg via INTRAVENOUS
  Filled 2015-01-22: qty 1

## 2015-01-22 MED ORDER — CARVEDILOL 3.125 MG PO TABS
3.1250 mg | ORAL_TABLET | Freq: Once | ORAL | Status: AC
  Administered 2015-01-22: 3.125 mg via ORAL
  Filled 2015-01-22: qty 1

## 2015-01-22 NOTE — ED Notes (Signed)
The pt is c/o chest and head pain for one week.  More head pain than chest pain.  He was admitted for a chest pain work up for 3 days mongths ago.  He did not follow up as directed when he left the hospital.  He has not had any blood pressure med for 2-3 months.  He also reports sob none at present and some lt arm  Pain.  None now.

## 2015-01-22 NOTE — ED Notes (Signed)
thed pt reports his headache is not much better if any better,  He is still having some chest discomfort

## 2015-01-22 NOTE — ED Notes (Signed)
Pt reports having chest pains and palpitations for extended amount of time. HR 70 at triage. Denies cough. Airway intact, ekg done.

## 2015-01-23 MED ORDER — LISINOPRIL 2.5 MG PO TABS: 2.5000 mg | ORAL_TABLET | Freq: Every day | ORAL | Status: DC

## 2015-01-23 MED ORDER — ASPIRIN 81 MG PO CHEW: 81.0000 mg | CHEWABLE_TABLET | Freq: Every day | ORAL | Status: DC

## 2015-01-23 MED ORDER — CARVEDILOL 3.125 MG PO TABS: 3.1250 mg | ORAL_TABLET | Freq: Two times a day (BID) | ORAL | Status: DC

## 2015-01-23 MED ORDER — FAMOTIDINE 20 MG PO TABS: 20.0000 mg | ORAL_TABLET | Freq: Two times a day (BID) | ORAL | Status: DC

## 2015-01-23 NOTE — ED Provider Notes (Signed)
CSN: [id number]     Arrival date & time 01/22/15  1435 History   First MD Initiated Contact with Patient 01/22/15 2030     Chief Complaint  Patient presents with  . Chest Pain     (Consider location/radiation/quality/duration/timing/severity/associated sxs/prior Treatment) HPI Comments: 50 year old male with history of hypertension and nonischemic cardiomyopathy who presents with headache and chest pain. The patient reports intermittent pain in his left chest as well as headache for the past one week. He states that he had the same type of chest pain a few months ago and was admitted with full workup including cardiac catheterization. He was discharged with medications but he never filled any of the prescriptions and never took any of the medications. He also did not follow-up with the cardiologist. He reports that his chest pain feels like a discomfort and is sometimes associated with left arm pain and shortness of breath. The pain is intermittent and is the same pain that he had prior to his hospitalization but he states that he has had it more frequently recently. He denies any cough/cold symptoms, fevers, or vomiting. Regarding his headache, it has been gradual in onset and intermittent. No visual changes, extremity weakness, or problems with balance.  Patient is a 50 y.o. male presenting with chest pain. The history is provided by the patient.  Chest Pain   Past Medical History  Diagnosis Date  . Obesity (BMI 30-39.9)   . Essential hypertension 10/24/2014  . Family history of premature CAD   . NICM (nonischemic cardiomyopathy) (HCC)     a. 10/2014 Cath: nl cors, EF 35-45%, glob HK.   Past Surgical History  Procedure Laterality Date  . None    . Cardiac catheterization N/A 10/25/2014    Procedure: Left Heart Cath and Coronary Angiography;  Surgeon: Lyn Records, MD;  Location: [facility] INVASIVE CV LAB;  Service: Cardiovascular;  Laterality: N/A;   Family History  Problem Relation Age of  Onset  . Heart attack Sister 47    Died in her sleep, dx at autopsy  . Heart attack Cousin 81    Died in his sleep   Social History  Substance Use Topics  . Smoking status: Current Every Day Smoker -- 0.50 packs/day for 30 years    Types: Cigarettes  . Smokeless tobacco: Never Used  . Alcohol Use: 0.0 oz/week    0 Standard drinks or equivalent per week     Comment: Occasional social drinker    Review of Systems  Cardiovascular: Positive for chest pain.      Allergies  Review of patient's allergies indicates no known allergies.  Home Medications   Prior to Admission medications   Medication Sig Start Date End Date Taking? Authorizing Provider  aspirin 81 MG chewable tablet Chew 1 tablet (81 mg total) by mouth daily. 01/23/15   Laurence Spates, MD  aspirin EC 81 MG EC tablet Take 1 tablet (81 mg total) by mouth daily. Patient not taking: Reported on 01/22/2015 10/26/14   Ok Anis, NP  carvedilol (COREG) 3.125 MG tablet Take 1 tablet (3.125 mg total) by mouth 2 (two) times daily with a meal. Patient not taking: Reported on 01/22/2015 10/26/14   Brittainy Sherlynn Carbon, PA-C  carvedilol (COREG) 3.125 MG tablet Take 1 tablet (3.125 mg total) by mouth 2 (two) times daily with a meal. 01/23/15   Laurence Spates, MD  famotidine (PEPCID) 20 MG tablet Take 1 tablet (20 mg total) by mouth 2 (two) times  daily. Patient not taking: Reported on 01/22/2015 10/26/14   Brittainy M Sharol Harness, PA-C  famotidine (PEPCID) 20 MG tablet Take 1 tablet (20 mg total) by mouth 2 (two) times daily. 01/23/15   Laurence Spates, MD  lisinopril (PRINIVIL,ZESTRIL) 2.5 MG tablet Take 1 tablet (2.5 mg total) by mouth daily. 01/23/15   Laurence Spates, MD  lisinopril (ZESTRIL) 2.5 MG tablet Take 1 tablet (2.5 mg total) by mouth daily. Patient not taking: Reported on 01/22/2015 10/25/14   Ok Anis, NP   BP 166/107 mmHg  Pulse 62  Temp(Src) 98.2 F (36.8 C) (Oral)  Resp 21  SpO2 96% Physical  Exam  Constitutional: He is oriented to person, place, and time. He appears well-developed and well-nourished. No distress.  HENT:  Head: Normocephalic and atraumatic.  Moist mucous membranes  Eyes: Pupils are equal, round, and reactive to light.  Conjunctival injection  Neck: Neck supple.  Cardiovascular: Normal rate, regular rhythm and normal heart sounds.   No murmur heard. Pulmonary/Chest: Effort normal and breath sounds normal.  Abdominal: Soft. Bowel sounds are normal. He exhibits no distension. There is no tenderness.  Musculoskeletal: He exhibits no edema.  Neurological: He is alert and oriented to person, place, and time. No cranial nerve deficit. Coordination normal.  Fluent speech, normal finger to nose testing, negative pronator drift, 5/5 strength and normal sensation throughout  Skin: Skin is warm and dry.  Psychiatric: He has a normal mood and affect. Judgment normal.  Nursing note and vitals reviewed.   ED Course  Procedures (including critical care time) Labs Review Labs Reviewed  BASIC METABOLIC PANEL - Abnormal; Notable for the following:    Glucose, Bld 117 (*)    All other components within normal limits  CBC - Abnormal; Notable for the following:    RBC 6.14 (*)    Hemoglobin 17.5 (*)    All other components within normal limits  I-STAT TROPOININ, ED  Rosezena Sensor, ED    Imaging Review Dg Chest 2 View  01/22/2015  CLINICAL DATA:  Left upper chest pain for 2 months. EXAM: CHEST  2 VIEW COMPARISON:  10/24/2014 FINDINGS: Lung markings are slightly prominent but minimally changed from the previous examination. Heart size is within normal limits and stable. Trachea is midline. Degenerative changes in the lower thoracic spine. No pleural effusions. IMPRESSION: Lung markings are mildly prominent but minimally changed from the previous examination. No acute chest findings. Electronically Signed   By: Richarda Overlie M.D.   On: 01/22/2015 15:25   I have personally  reviewed and evaluated these lab results as part of my medical decision-making.   EKG Interpretation   Date/Time:  Wednesday January 22 2015 14:44:54 EST Ventricular Rate:  69 PR Interval:  148 QRS Duration: 98 QT Interval:  404 QTC Calculation: 432 R Axis:   -62 Text Interpretation:  Normal sinus rhythm Left anterior fascicular block  Minimal voltage criteria for LVH, may be normal variant Abnormal ECG No  significant change since last tracing Confirmed by Karmin Kasprzak MD, Jalynne Persico  [phone]) on 01/22/2015 9:16:58 PM     Medications  ketorolac (TORADOL) 30 MG/ML injection 30 mg (30 mg Intravenous Given 01/22/15 2258)  carvedilol (COREG) tablet 3.125 mg (3.125 mg Oral Given 01/22/15 2258)    MDM   Final diagnoses:  Nonintractable episodic headache, unspecified headache type  Essential hypertension  Chest pain, unspecified chest pain type    Patient presents with intermittent chest pain and gradual onset of headache for the past  week. He later states that he has had this chest pain previously which led to hospitalization and workup several months ago. On exam, he was well-appearing. Vital signs notable for hypertension at 158/111. EKG is unchanged from previous. Normal neurologic exam. His headache was gradual in onset therefore I do not feel he needs any head imaging at this time. Obtained above labs including serial troponins which were negative. Chest x-ray negative acute. Gave the patient Toradol and also gave him a dose of Coreg, which is what he is supposed to be taking. I reviewed his chart w/ previous w/u showing clean heart cath a few mo ago, NICM thought to be 2/2 HTN. He has no risk factors for PE. Given that his chest pain symptoms are exactly the same as what led him here a few months ago, I do not feel he needs further cardiac workup that I have strongly counseled on the importance of medication compliance and close follow-up. I have given him refills on all the medications he was  discharged with a few months ago and emphasized importance of use. Also provided follow-up information for Dr. Excell Seltzer which is who he was supposed to see in the clinic. His headache was mildly improved at time of discharge. Return precautions reviewed and patient voiced understanding. Patient discharged in satisfactory condition.   Laurence Spates, MD 01/23/15 [phone]

## 2015-05-26 ENCOUNTER — Emergency Department (HOSPITAL_COMMUNITY): Payer: BLUE CROSS/BLUE SHIELD

## 2015-05-26 ENCOUNTER — Emergency Department (HOSPITAL_COMMUNITY)
Admission: EM | Admit: 2015-05-26 | Discharge: 2015-05-27 | Disposition: A | Discharge status: Home / Self Care | Payer: BLUE CROSS/BLUE SHIELD | Attending: Emergency Medicine | Admitting: Emergency Medicine

## 2015-05-26 ENCOUNTER — Encounter (HOSPITAL_COMMUNITY): Payer: Self-pay | Admitting: Emergency Medicine

## 2015-05-26 DIAGNOSIS — Z79899 Other long term (current) drug therapy: Secondary | ICD-10-CM | POA: Insufficient documentation

## 2015-05-26 DIAGNOSIS — Z7982 Long term (current) use of aspirin: Secondary | ICD-10-CM | POA: Diagnosis not present

## 2015-05-26 DIAGNOSIS — M79604 Pain in right leg: Secondary | ICD-10-CM | POA: Diagnosis present

## 2015-05-26 DIAGNOSIS — M7611 Psoas tendinitis, right hip: Secondary | ICD-10-CM

## 2015-05-26 DIAGNOSIS — I10 Essential (primary) hypertension: Secondary | ICD-10-CM | POA: Diagnosis not present

## 2015-05-26 DIAGNOSIS — F17210 Nicotine dependence, cigarettes, uncomplicated: Secondary | ICD-10-CM | POA: Diagnosis not present

## 2015-05-26 DIAGNOSIS — M25851 Other specified joint disorders, right hip: Secondary | ICD-10-CM | POA: Diagnosis not present

## 2015-05-26 DIAGNOSIS — E669 Obesity, unspecified: Secondary | ICD-10-CM | POA: Diagnosis not present

## 2015-05-26 MED ORDER — HYDROCODONE-ACETAMINOPHEN 5-325 MG PO TABS
1.0000 | ORAL_TABLET | Freq: Once | ORAL | Status: AC
  Administered 2015-05-26: 1 via ORAL
  Filled 2015-05-26: qty 1

## 2015-05-26 NOTE — ED Notes (Signed)
Patient transported to X-ray 

## 2015-05-26 NOTE — ED Provider Notes (Signed)
CSN: [id number]     Arrival date & time 05/26/15  1849 History  By signing my name below, I, [place], attest that this documentation has been prepared under the direction and in the presence of [place], NP. Electronically Signed: Tanda Gould, ED Scribe. 05/26/2015. 10:27 PM.   Chief Complaint  Patient presents with  . Leg Pain   Patient is a 50 y.o. male presenting with leg pain. The history is provided by the patient. No language interpreter was used.  Leg Pain Location:  Hip and leg Time since incident: 4 years. Injury: no   Hip location:  R hip Leg location:  R leg Pain details:    Quality:  Burning   Radiates to:  R leg   Severity:  Moderate   Onset quality:  Gradual   Duration: 2 years off and on.   Timing:  Intermittent   Progression:  Worsening Chronicity:  Chronic Foreign body present:  No foreign bodies Prior injury to area:  No Relieved by:  Nothing Worsened by:  Activity and bearing weight Ineffective treatments:  Acetaminophen and NSAIDs Associated symptoms: no muscle weakness, no numbness and no tingling   Risk factors: obesity      HPI Comments: [place] is a 50 y.o. male with PMHx HTN, who presents to the Emergency Department complaining of gradual onset, intermittent, burning, right hip pain radiating down right thigh x 2 years, worsening this week. No known injury to the leg. Pt has been dealing with the pain for the past 2 years but reports that today he was unable to bear weight onto his leg, prompting him to come to the ED today. Pt is originally from [place] and was scheduled to see his PCP in [place] today. He was supposed to catch a train last night but states his pain was so severe that he did not get on the train and came to the ED instead. Pt has been taking Tylenol and Ibuprofen without relief. His last dose was a few days ago. Denies weakness, numbness, tingling, or any other associated symptoms. Patient reports the symptoms  have gotten worse over the past few months since he has been here in [place] taking care of his brother who suffered a GSW and is wheel chair bound.   Past Medical History  Diagnosis Date  . Obesity (BMI 30-39.9)   . Essential hypertension 10/24/2014  . Family history of premature CAD   . NICM (nonischemic cardiomyopathy) (HCC)     a. 10/2014 Cath: nl cors, EF 35-45%, glob HK.   Past Surgical History  Procedure Laterality Date  . None    . Cardiac catheterization N/A 10/25/2014    Procedure: Left Heart Cath and Coronary Angiography;  Surgeon: Lyn Records, MD;  Location: [facility] INVASIVE CV LAB;  Service: Cardiovascular;  Laterality: N/A;   Family History  Problem Relation Age of Onset  . Heart attack Sister 64    Died in her sleep, dx at autopsy  . Heart attack Cousin 18    Died in his sleep   Social History  Substance Use Topics  . Smoking status: Current Every Day Smoker -- 0.00 packs/day for 30 years    Types: Cigarettes  . Smokeless tobacco: Never Used  . Alcohol Use: Yes    Review of Systems  Musculoskeletal: Positive for arthralgias (right hip and right thigh pain).  Neurological: Negative for weakness and numbness.  All other systems reviewed and are negative.  Allergies  Review of  patient's allergies indicates no known allergies.  Home Medications   Prior to Admission medications   Medication Sig Start Date End Date Taking? Authorizing Provider  aspirin 81 MG chewable tablet Chew 1 tablet (81 mg total) by mouth daily. 01/23/15   Laurence Spates, MD  aspirin EC 81 MG EC tablet Take 1 tablet (81 mg total) by mouth daily. Patient not taking: Reported on 01/22/2015 10/26/14   Ok Anis, NP  carvedilol (COREG) 3.125 MG tablet Take 1 tablet (3.125 mg total) by mouth 2 (two) times daily with a meal. Patient not taking: Reported on 01/22/2015 10/26/14   Brittainy Sherlynn Carbon, PA-C  carvedilol (COREG) 3.125 MG tablet Take 1 tablet (3.125 mg total) by mouth 2 (two)  times daily with a meal. 01/23/15   Laurence Spates, MD  diclofenac (VOLTAREN) 50 MG EC tablet Take 1 tablet (50 mg total) by mouth 2 (two) times daily. 05/27/15   Jai Bear Orlene Och, NP  famotidine (PEPCID) 20 MG tablet Take 1 tablet (20 mg total) by mouth 2 (two) times daily. Patient not taking: Reported on 01/22/2015 10/26/14   Brittainy M Sharol Harness, PA-C  famotidine (PEPCID) 20 MG tablet Take 1 tablet (20 mg total) by mouth 2 (two) times daily. 01/23/15   Laurence Spates, MD  lisinopril (PRINIVIL,ZESTRIL) 2.5 MG tablet Take 1 tablet (2.5 mg total) by mouth daily. 01/23/15   Laurence Spates, MD  lisinopril (ZESTRIL) 2.5 MG tablet Take 1 tablet (2.5 mg total) by mouth daily. Patient not taking: Reported on 01/22/2015 10/25/14   Ok Anis, NP  traMADol (ULTRAM) 50 MG tablet Take 1 tablet (50 mg total) by mouth every 6 (six) hours as needed. 05/27/15   Aziah Kaiser Orlene Och, NP   BP 159/101 mmHg  Pulse 65  Temp(Src) 98.6 F (37 C) (Oral)  Resp 20  Ht 5\' 8"  (1.727 m)  Wt 144.697 kg  BMI 48.51 kg/m2  SpO2 96%   Physical Exam  Constitutional: He is oriented to person, place, and time. He appears well-developed and well-nourished. No distress.  HENT:  Head: Normocephalic and atraumatic.  Eyes: Conjunctivae and EOM are normal.  Neck: Neck supple. No tracheal deviation present.  Cardiovascular: Normal rate.   Pulmonary/Chest: Effort normal. No respiratory distress.  Abdominal: Soft. There is no tenderness.  Musculoskeletal:       Right hip: He exhibits decreased range of motion and tenderness. He exhibits no deformity and no laceration.  Pedal pulses 2+, pain with palpation and range of motion of the right hip.   Neurological: He is alert and oriented to person, place, and time.  Skin: Skin is warm and dry.  Psychiatric: He has a normal mood and affect. His behavior is normal.  Nursing note and vitals reviewed.   ED Course  Procedures (including critical care time)  DIAGNOSTIC  STUDIES: Oxygen Saturation is 97% on RA, normal by my interpretation.    COORDINATION OF CARE: 10:25 PM-Discussed treatment plan which includes DG R Hip with pt at bedside and pt agreed to plan.   Labs Review Labs Reviewed - No data to display  Imaging Review Dg Hip Unilat With Pelvis 2-3 Views Right  05/26/2015  CLINICAL DATA:  Intermittent right hip pain for years. EXAM: DG HIP (WITH OR WITHOUT PELVIS) 2-3V RIGHT COMPARISON:  None. FINDINGS: Negative for fracture dislocation. There is no bone lesion or bony destruction. There are morphologic changes of the hip suggesting femoroacetabular impingement. No significant arthritic changes. Pubic symphysis and sacroiliac  joints appear unremarkable. IMPRESSION: No acute findings. Morphologic changes of the hip suggests femoral acetabular impingement. Electronically Signed   By: Ellery Plunk M.D.   On: 05/26/2015 23:21   I have personally reviewed and evaluated these images as part of my medical decision-making.  1610 consult with Dr. Robyne Peers, non weight bearing and f/u in the office  MDM  50 y.o. male with right hip pain stable for d/c with crutches and no focal neuro deficits. Will treat with NSAIDS and Tramadol. Patient to f/u with Dr. Roda Shutters. Discussed with the patient and all questioned fully answered. He voices understanding and agrees with plan.   Final diagnoses:  Femoral acetabular impingement, right    I personally performed the services described in this documentation, which was scribed in my presence. The recorded information has been reviewed and is accurate.      [address] [place], NP 05/27/15 [id number]  Tomasita Crumble, MD 05/27/15 [phone]

## 2015-05-26 NOTE — ED Notes (Signed)
Pt. reports chronic right upper/lateral thigh pain for 4 years , denies recent fall or injury , pain worse this week , pain increases with movement / ambulation .

## 2015-05-27 MED ORDER — DICLOFENAC SODIUM 50 MG PO TBEC: 50.0000 mg | DELAYED_RELEASE_TABLET | Freq: Two times a day (BID) | ORAL | Status: DC

## 2015-05-27 MED ORDER — TRAMADOL HCL 50 MG PO TABS: 50.0000 mg | ORAL_TABLET | Freq: Four times a day (QID) | ORAL | Status: DC | PRN

## 2015-05-27 NOTE — Discharge Instructions (Signed)
Do not take the narcotic if driving as it will make you sleepy. Call Dr. Roda Shutters for follow up

## 2015-11-22 ENCOUNTER — Emergency Department (HOSPITAL_COMMUNITY): Payer: BLUE CROSS/BLUE SHIELD

## 2015-11-22 ENCOUNTER — Emergency Department (HOSPITAL_COMMUNITY)
Admission: EM | Admit: 2015-11-22 | Discharge: 2015-11-22 | Disposition: A | Discharge status: Home / Self Care | Payer: BLUE CROSS/BLUE SHIELD | Attending: Emergency Medicine | Admitting: Emergency Medicine

## 2015-11-22 ENCOUNTER — Encounter (HOSPITAL_COMMUNITY): Payer: Self-pay

## 2015-11-22 DIAGNOSIS — F17210 Nicotine dependence, cigarettes, uncomplicated: Secondary | ICD-10-CM | POA: Diagnosis not present

## 2015-11-22 DIAGNOSIS — R55 Syncope and collapse: Secondary | ICD-10-CM | POA: Diagnosis not present

## 2015-11-22 DIAGNOSIS — Z79899 Other long term (current) drug therapy: Secondary | ICD-10-CM | POA: Diagnosis not present

## 2015-11-22 DIAGNOSIS — Z7982 Long term (current) use of aspirin: Secondary | ICD-10-CM | POA: Insufficient documentation

## 2015-11-22 DIAGNOSIS — R519 Headache, unspecified: Secondary | ICD-10-CM

## 2015-11-22 DIAGNOSIS — I10 Essential (primary) hypertension: Secondary | ICD-10-CM | POA: Diagnosis not present

## 2015-11-22 DIAGNOSIS — R51 Headache: Secondary | ICD-10-CM | POA: Diagnosis not present

## 2015-11-22 LAB — CSF CELL COUNT WITH DIFFERENTIAL
RBC COUNT CSF: 104 /mm3 — AB
RBC COUNT CSF: 665 /mm3 — AB
TUBE #: 1
TUBE #: 4
WBC CSF: 2 /mm3 (ref 0–5)
WBC, CSF: 3 /mm3 (ref 0–5)

## 2015-11-22 LAB — URINALYSIS, ROUTINE W REFLEX MICROSCOPIC
Bilirubin Urine: NEGATIVE
Glucose, UA: NEGATIVE mg/dL
Hgb urine dipstick: NEGATIVE
Ketones, ur: NEGATIVE mg/dL
Leukocytes, UA: NEGATIVE
NITRITE: NEGATIVE
Protein, ur: NEGATIVE mg/dL
SPECIFIC GRAVITY, URINE: 1.015 (ref 1.005–1.030)
pH: 7.5 (ref 5.0–8.0)

## 2015-11-22 LAB — COMPREHENSIVE METABOLIC PANEL
ALK PHOS: 83 U/L (ref 38–126)
ALT: 16 U/L — ABNORMAL LOW (ref 17–63)
ANION GAP: 5 (ref 5–15)
AST: 17 U/L (ref 15–41)
Albumin: 4 g/dL (ref 3.5–5.0)
BUN: 5 mg/dL — ABNORMAL LOW (ref 6–20)
CALCIUM: 9.2 mg/dL (ref 8.9–10.3)
CO2: 26 mmol/L (ref 22–32)
Chloride: 108 mmol/L (ref 101–111)
Creatinine, Ser: 0.93 mg/dL (ref 0.61–1.24)
GFR calc non Af Amer: >60 mL/min (ref >60)
Glucose, Bld: 104 mg/dL — ABNORMAL HIGH (ref 65–99)
POTASSIUM: 4.2 mmol/L (ref 3.5–5.1)
SODIUM: 139 mmol/L (ref 135–145)
Total Bilirubin: 0.6 mg/dL (ref 0.3–1.2)
Total Protein: 7.6 g/dL (ref 6.5–8.1)

## 2015-11-22 LAB — CBC WITH DIFFERENTIAL/PLATELET
BASOS PCT: 0 %
Basophils Absolute: 0 10*3/uL (ref 0.0–0.1)
EOS ABS: 0.2 10*3/uL (ref 0.0–0.7)
Eosinophils Relative: 2 %
HCT: 51.5 % (ref 39.0–52.0)
HEMOGLOBIN: 17.7 g/dL — AB (ref 13.0–17.0)
LYMPHS ABS: 1.9 10*3/uL (ref 0.7–4.0)
Lymphocytes Relative: 23 %
MCH: 28.7 pg (ref 26.0–34.0)
MCHC: 34.4 g/dL (ref 30.0–36.0)
MCV: 83.5 fL (ref 78.0–100.0)
Monocytes Absolute: 0.6 10*3/uL (ref 0.1–1.0)
Monocytes Relative: 8 %
NEUTROS ABS: 5.5 10*3/uL (ref 1.7–7.7)
NEUTROS PCT: 67 %
Platelets: 187 10*3/uL (ref 150–400)
RBC: 6.17 MIL/uL — AB (ref 4.22–5.81)
RDW: 14.8 % (ref 11.5–15.5)
WBC: 8.3 10*3/uL (ref 4.0–10.5)

## 2015-11-22 LAB — GLUCOSE, CSF: Glucose, CSF: 66 mg/dL (ref 40–70)

## 2015-11-22 LAB — RAPID URINE DRUG SCREEN, HOSP PERFORMED
AMPHETAMINES: NOT DETECTED
Barbiturates: NOT DETECTED
Benzodiazepines: NOT DETECTED
COCAINE: NOT DETECTED
OPIATES: NOT DETECTED
TETRAHYDROCANNABINOL: POSITIVE — AB

## 2015-11-22 LAB — TROPONIN I: Troponin I: 0.11 ng/mL (ref <0.03)

## 2015-11-22 LAB — PROTEIN, CSF: Total  Protein, CSF: 41 mg/dL (ref 15–45)

## 2015-11-22 LAB — ETHANOL: Alcohol, Ethyl (B): <5 mg/dL (ref <5)

## 2015-11-22 LAB — SEDIMENTATION RATE: SED RATE: 3 mm/h (ref 0–16)

## 2015-11-22 MED ORDER — HYDROMORPHONE HCL 2 MG/ML IJ SOLN
1.0000 mg | Freq: Once | INTRAMUSCULAR | Status: AC
  Administered 2015-11-22: 1 mg via INTRAVENOUS
  Filled 2015-11-22: qty 1

## 2015-11-22 MED ORDER — LIDOCAINE HCL (PF) 1 % IJ SOLN
INTRAMUSCULAR | Status: AC
  Filled 2015-11-22: qty 30

## 2015-11-22 MED ORDER — METOCLOPRAMIDE HCL 5 MG/ML IJ SOLN
10.0000 mg | Freq: Once | INTRAMUSCULAR | Status: AC
  Administered 2015-11-22: 10 mg via INTRAVENOUS
  Filled 2015-11-22: qty 2

## 2015-11-22 MED ORDER — HYDRALAZINE HCL 20 MG/ML IJ SOLN
20.0000 mg | Freq: Once | INTRAMUSCULAR | Status: AC
  Administered 2015-11-22: 20 mg via INTRAVENOUS
  Filled 2015-11-22: qty 1

## 2015-11-22 MED ORDER — SODIUM CHLORIDE 0.9 % IV SOLN
INTRAVENOUS | Status: DC
  Administered 2015-11-22: 100 mL/h via INTRAVENOUS

## 2015-11-22 MED ORDER — TETRACAINE HCL 0.5 % OP SOLN
2.0000 [drp] | Freq: Once | OPHTHALMIC | Status: AC
  Administered 2015-11-22: 2 [drp] via OPHTHALMIC
  Filled 2015-11-22: qty 2

## 2015-11-22 MED ORDER — ONDANSETRON HCL 4 MG/2ML IJ SOLN
4.0000 mg | Freq: Once | INTRAMUSCULAR | Status: AC
  Administered 2015-11-22: 4 mg via INTRAVENOUS
  Filled 2015-11-22: qty 2

## 2015-11-22 MED ORDER — LIDOCAINE HCL (PF) 1 % IJ SOLN
10.0000 mL | Freq: Once | INTRAMUSCULAR | Status: DC
  Filled 2015-11-22: qty 10

## 2015-11-22 MED ORDER — LISINOPRIL-HYDROCHLOROTHIAZIDE 20-12.5 MG PO TABS: 1.0000 | ORAL_TABLET | Freq: Every day | ORAL | 0 refills | Status: DC

## 2015-11-22 MED ORDER — DIPHENHYDRAMINE HCL 50 MG/ML IJ SOLN
25.0000 mg | Freq: Once | INTRAMUSCULAR | Status: AC
  Administered 2015-11-22: 25 mg via INTRAVENOUS
  Filled 2015-11-22: qty 1

## 2015-11-22 MED ORDER — LIDOCAINE HCL 1 % IJ SOLN
INTRAMUSCULAR | Status: AC
  Administered 2015-11-22: 10 mL
  Filled 2015-11-22: qty 20

## 2015-11-22 NOTE — ED Notes (Signed)
Patient transported to X-ray 

## 2015-11-22 NOTE — ED Provider Notes (Addendum)
Patient indicates headache much improved.   Ophthalmology has evaluated and indicates they plan to re-assess in their office tomorrow AM.  LP results not c/w SAH.  No change in speech or vision. No numbness/weakness or loss of normal functional ability.   Head ct neg acute.  No sinus or temporal tenderness. Afeb.  Patient indicates hungry/thirsty.  Po fluids. Up to bathroom.  reglan iv/benadryl iv.  Patient ate food from subway. Up to BR.  On recheck no neck stiffness or rigidity. Denies change in vision. No numbness/weakness. Feels markedly improved.   Patient currently feels improved and appears stable for d/c.       Cathren Laine, MD 11/22/15 2158

## 2015-11-22 NOTE — Consult Note (Signed)
CC:  Chief Complaint  Patient presents with  . Headache  . Belepharitis    HPI: [place] is a 50 y.o. male w/ POH of Presbyopia and PMH below who presents for evaluation of headache, eye pain, and injection. Symptoms started this morning, waking up with severe headache, accompanied by photophobia, transient blurred vision, and periorbital pain. Reportedly, he also had a syncopal episode this morning.   ED obtained eye pressures in the 30s, and given the clinical picture, ophthalmology was consulted to evaluate for angle-closure glaucoma. CT Head (non-contrast) was unremarkable. He is pending fluoro-guided LP.  ROS: Denies fever/chills, unintentional weight loss, chest pain, irregular heart rhythm, SOB, cough, wheezing, abdominal pain, melena, hematochezia, weakness, numbness, slurring of speech, facial droop, muscle weakness, joint pain, skin rash, tattoos, depressed mood  PMH: Past Medical History:  Diagnosis Date  . Essential hypertension 10/24/2014  . Family history of premature CAD   . NICM (nonischemic cardiomyopathy) (HCC)    a. 10/2014 Cath: nl cors, EF 35-45%, glob HK.  . Obesity (BMI 30-39.9)     PSH: Past Surgical History:  Procedure Laterality Date  . CARDIAC CATHETERIZATION N/A 10/25/2014   Procedure: Left Heart Cath and Coronary Angiography;  Surgeon: Lyn RecordsHenry W Smith, MD;  [facility];  Service: Cardiovascular;  Laterality: N/A;  . None      Meds: No current facility-administered medications on file prior to encounter.    Current Outpatient Prescriptions on File Prior to Encounter  Medication Sig Dispense Refill  . aspirin 81 MG chewable tablet Chew 1 tablet (81 mg total) by mouth daily. 30 tablet 0  . aspirin EC 81 MG EC tablet Take 1 tablet (81 mg total) by mouth daily. (Patient not taking: Reported on 11/22/2015)    . carvedilol (COREG) 3.125 MG tablet Take 1 tablet (3.125 mg total) by mouth 2 (two) times daily with a meal. (Patient not  taking: Reported on 11/22/2015) 60 tablet 5  . carvedilol (COREG) 3.125 MG tablet Take 1 tablet (3.125 mg total) by mouth 2 (two) times daily with a meal. (Patient not taking: Reported on 11/22/2015) 60 tablet 0  . diclofenac (VOLTAREN) 50 MG EC tablet Take 1 tablet (50 mg total) by mouth 2 (two) times daily. (Patient not taking: Reported on 11/22/2015) 15 tablet 0  . famotidine (PEPCID) 20 MG tablet Take 1 tablet (20 mg total) by mouth 2 (two) times daily. (Patient not taking: Reported on 11/22/2015) 60 tablet 5  . famotidine (PEPCID) 20 MG tablet Take 1 tablet (20 mg total) by mouth 2 (two) times daily. (Patient not taking: Reported on 11/22/2015) 60 tablet 0  . lisinopril (PRINIVIL,ZESTRIL) 2.5 MG tablet Take 1 tablet (2.5 mg total) by mouth daily. (Patient not taking: Reported on 11/22/2015) 30 tablet 0  . lisinopril (ZESTRIL) 2.5 MG tablet Take 1 tablet (2.5 mg total) by mouth daily. (Patient not taking: Reported on 11/22/2015) 30 tablet 6  . traMADol (ULTRAM) 50 MG tablet Take 1 tablet (50 mg total) by mouth every 6 (six) hours as needed. (Patient not taking: Reported on 11/22/2015) 15 tablet 0    SH: Social History   Social History  . Marital status: Married    Spouse name: N/A  . Number of children: N/A  . Years of education: N/A   Occupational History  . [employer] doctorDriver    Social History Main Topics  . Smoking status: Current Every Day Smoker    Packs/day: 0.50    Years: 30.00  Types: Cigarettes  . Smokeless tobacco: Never Used  . Alcohol use No  . Drug use:     Types: Marijuana     Comment: THC  . Sexual activity: Not on file   Other Topics Concern  . Not on file   Social History Narrative   Currently lives in [place], [place].     FH: Family History  Problem Relation Age of Onset  . Heart attack Sister 55    Died in her sleep, dx at autopsy  . Heart attack Cousin 37    Died in his sleep    Exam:  Zenaida Niece: OD: ([place]) 10pt (difficult effort) - post LP OS: ([place]) 10 pt  (difficult effort) - post LP  CVF: OD: full OS: full  EOM: OD: full d/v OS: full d/v  Pupils: OD: 2.75-50mm, no APD OS: 2.75-50mm, no APD  IOP: by [place] OD: 23, 24 OS: 24, 23  External: OD: Prominent globes, no corneal exposure or lateral flare, V1-V3 intact and symmetric, good orbicularis strength OS: Prominent globes, no corneal exposure or lateral flare, V1-V3 intact and symmetric, good orbicularis strength  PF: 7/7  ULE: 12/12  Pen Light Exam: L/L: OD: Mild edema, floppy eyelids OS: Mild edema, floppy eyelids  C/S: OD: 2+ diffuse injection, no chemosis OS: 2+ diffuse injection, no chemosis  K: OD: clear, no abnormal staining, no edema OS: clear, no abnormal staining, no edema  A/C: OD: grossly deep and quiet appearing by pen light OS: grossly deep and quiet appearing by pen light  I: OD: round and regular OS: round and regular  L: OD: Trace NSC OS: Trace NSC  DFE: dilated @ 5:59 PM w/ Paremyd OU  V: OD: clear OS: clear  N: OD: C/D 0.35, hyperemic, no disc edema OS: C/D 0.35, hyperemic, no disc edema  M: OD: flat, no obvious macular pathology OS: flat, no obvious macular pathology  V: OD: normal appearing vessels OS: normal appearing vessels  P: OD: retina flat 360, no obvious mass/RT/RD OS: retina flat 360, no obvious mass/RT/RD  A/P:  1. Ocular Pain and Conjunctival Injection - IOPs mildly elevated -- 23-24, not contributing to pain - No evidence of angle-closure glaucoma - IOPs mildly elevated, pupils are not fixed/dilated, and no evidence of corneal edema - No evidence of iritis/uveitis at bedside, but would need examination upright at the slit lamp -- patient just s/p LP, so unable to obtain in ED - Differential diagnosis also includes acute anterior uveitis, scleritis, CC fistula (no history of trauma). Would recommend evaluation in clinic tomorrow pending discharge if w/o improvement to rule out iritis/scleritis -- discussed  with patient's mother at bedside - Dilated examination shows hyperemic discs, but no disc edema, clear view  2. Prominent Globes - Baseline for patient, per patient's mother  If no neurologic explanation found, would recommend follow-up in my office tomorrow morning at 9 AM.   Wynell Balloon, MD,MPH Ophthalmology [phone]

## 2015-11-22 NOTE — ED Notes (Signed)
Pt desats to 78% rises when stimulated. Pt placed on 2L /[place]. And sats rise. Will inform md

## 2015-11-22 NOTE — Discharge Instructions (Signed)
It was our pleasure to provide your ER care today - we hope that you feel better.  Rest. Drink adequate fluids.  Follow the post lumbar puncture instruction sheet.   Your blood pressure is high today - limit salt intake,  and take your blood pressure medication as prescribed.  Follow up with primary care doctor in the coming week for recheck.  For eye pain - follow up with eye specialist in his office tomorrow morning at 9 AM as discussed with him.    For headache, follow up with neurologist in 1 week - see referral -  call office this Monday to arrange that follow up.   Return to ER if worse, new symptoms, fevers, change in vision or speech, numbness or weakness, other concern.   You were given sedating medication in the ER - no driving for the next 4 hours.

## 2015-11-22 NOTE — ED Notes (Signed)
Pt sleeping with snoring respirations

## 2015-11-22 NOTE — ED Notes (Signed)
Pt returns from CT. Pt states pain is unchanged at 10/10 but less movement and speaks more easily.

## 2015-11-22 NOTE — ED Notes (Signed)
ED Provider at bedside. 

## 2015-11-22 NOTE — Procedures (Signed)
Preprocedure Dx: Headache Postprocedure Dx: Headache Procedure:  Fluoroscopically guided lumbar puncture Radiologist:  Tyron Russell Anesthesia:  8 ml of 1% lidocaine Specimen:  11 ml CSF, clear colorless EBL:   < 1 ml Opening pressure: 13.5 cm H2O Complications: None

## 2015-11-22 NOTE — ED Notes (Signed)
Patient transported to CT 

## 2015-11-22 NOTE — ED Notes (Signed)
Pt returns from xray lying flat. Opthamology at bedside on return.

## 2015-11-22 NOTE — ED Triage Notes (Signed)
Per EMS - pt woke up around 0500 with headache. Pt reports having syncopal episode this morning but uncertain as to when. Bilateral eyelid swelling, c/o eyelid pain. Denies hx of migraines. BP 184/129.

## 2015-11-22 NOTE — ED Provider Notes (Signed)
[facility]-EMERGENCY DEPT Provider Note   CSN: [id number] Arrival date & time: 11/22/15  1232     History   Chief Complaint Chief Complaint  Patient presents with  . Headache  . Belepharitis    HPI Kenneth Gould is a 50 y.o. male.   Headache     Patient states he was awakened by headache at about 5 AM. He reports he does not typically have headaches. He reports it was severe and throughout the front of his head and the back of his head. He did try to go back to sleep and then went to work subsequently. He reports that he passed out at work. He can't recall if it was before passing out or after but he reports he temporarily was unable to see. He denies he's had focal weakness numbness or tingling to the extremities. He denies nausea or vomiting. He reports his eyes are extremely light sensitive. He reports it feels like someone is trying to rip his eyes out but his whole head is very painful. Patient did run out of his blood pressure medications several weeks ago. He reports at baseline however his blood pressures don't run very high even without his medications. He reports that his systolic numbers remained around the 130s are 140s and the diastolics around the 80s without treatment.  Family history: Patient reports that his father died of an aneurysm in his 15s. Past Medical History:  Diagnosis Date  . Essential hypertension 10/24/2014  . Family history of premature CAD   . NICM (nonischemic cardiomyopathy) (HCC)    a. 10/2014 Cath: nl cors, EF 35-45%, glob HK.  . Obesity (BMI 30-39.9)     Patient Active Problem List   Diagnosis Date Noted  . Chest pain   . Unstable angina pectoris (HCC) 10/25/2014  . NICM (nonischemic cardiomyopathy) (HCC) 10/25/2014  . Chest pain with moderate risk for cardiac etiology 10/24/2014  . Essential hypertension 10/24/2014  . Chest pain, moderate coronary artery risk 10/24/2014    Past Surgical History:  Procedure Laterality Date  . CARDIAC  CATHETERIZATION N/A 10/25/2014   Procedure: Left Heart Cath and Coronary Angiography;  Surgeon: Lyn Records, MD;  Location: [facility] INVASIVE CV LAB;  Service: Cardiovascular;  Laterality: N/A;  . None         Home Medications    Prior to Admission medications   Medication Sig Start Date End Date Taking? Authorizing Provider  aspirin 81 MG chewable tablet Chew 1 tablet (81 mg total) by mouth daily. 01/23/15  Yes Laurence Spates, MD  ibuprofen (ADVIL,MOTRIN) 200 MG tablet Take 400 mg by mouth every 6 (six) hours as needed for headache or moderate pain.   Yes Historical Provider, MD  aspirin EC 81 MG EC tablet Take 1 tablet (81 mg total) by mouth daily. Patient not taking: Reported on 11/22/2015 10/26/14   Ok Anis, NP  carvedilol (COREG) 3.125 MG tablet Take 1 tablet (3.125 mg total) by mouth 2 (two) times daily with a meal. Patient not taking: Reported on 11/22/2015 10/26/14   Brittainy Sherlynn Carbon, PA-C  carvedilol (COREG) 3.125 MG tablet Take 1 tablet (3.125 mg total) by mouth 2 (two) times daily with a meal. Patient not taking: Reported on 11/22/2015 01/23/15   Laurence Spates, MD  diclofenac (VOLTAREN) 50 MG EC tablet Take 1 tablet (50 mg total) by mouth 2 (two) times daily. Patient not taking: Reported on 11/22/2015 05/27/15   Janne Napoleon, NP  famotidine (PEPCID) 20 MG tablet  Take 1 tablet (20 mg total) by mouth 2 (two) times daily. Patient not taking: Reported on 11/22/2015 10/26/14   Brittainy M Sharol Harness, PA-C  famotidine (PEPCID) 20 MG tablet Take 1 tablet (20 mg total) by mouth 2 (two) times daily. Patient not taking: Reported on 11/22/2015 01/23/15   Laurence Spates, MD  lisinopril (PRINIVIL,ZESTRIL) 2.5 MG tablet Take 1 tablet (2.5 mg total) by mouth daily. Patient not taking: Reported on 11/22/2015 01/23/15   Laurence Spates, MD  lisinopril (ZESTRIL) 2.5 MG tablet Take 1 tablet (2.5 mg total) by mouth daily. Patient not taking: Reported on 11/22/2015 10/25/14   Ok Anis, NP  traMADol (ULTRAM) 50 MG tablet Take 1 tablet (50 mg total) by mouth every 6 (six) hours as needed. Patient not taking: Reported on 11/22/2015 05/27/15   Janne Napoleon, NP    Family History Family History  Problem Relation Age of Onset  . Heart attack Sister 28    Died in her sleep, dx at autopsy  . Heart attack Cousin 71    Died in his sleep    Social History Social History  Substance Use Topics  . Smoking status: Current Every Day Smoker    Packs/day: 0.50    Years: 30.00    Types: Cigarettes  . Smokeless tobacco: Never Used  . Alcohol use No     Allergies   Review of patient's allergies indicates no known allergies.   Review of Systems Review of Systems  Neurological: Positive for headaches.   10 Systems reviewed and are negative for acute change except as noted in the HPI.   Physical Exam Updated Vital Signs BP (!) 175/103   Pulse 83   Temp 97.7 F (36.5 C) (Oral)   Resp 16   SpO2 96%   Physical Exam  Constitutional: He is oriented to person, place, and time.  Patient is alert. He has no respiratory distress. He appears to be in severe pain. He is keeping his eyes covered with a towel.  HENT:  Head: Normocephalic and atraumatic.  Right Ear: External ear normal.  Left Ear: External ear normal.  Nose: Nose normal.  Mouth/Throat: Oropharynx is clear and moist.  Bilateral TMs normal.  Eyes: EOM and lids are normal. Right conjunctiva is injected. Right conjunctiva has no hemorrhage. Left conjunctiva is injected. Left conjunctiva has no hemorrhage.  Patient does have scleral injection bilaterally. No drainage or discharge. Pupils are approximately 2 mm and symmetric.  Tono-Pen intraocular pressures: Left: 27, 32, 29 Right: 32, 29, 32  Neck: Neck supple.  Cardiovascular: Normal rate, regular rhythm, normal heart sounds and intact distal pulses.   Pulmonary/Chest: Effort normal and breath sounds normal.  Abdominal: Soft. He exhibits no distension.  There is no tenderness.  Musculoskeletal: Normal range of motion. He exhibits no edema or tenderness.  Neurological: He is alert and oriented to person, place, and time. No cranial nerve deficit. He exhibits normal muscle tone. Coordination normal.  Skin: Skin is warm and dry.  Psychiatric:  Patient is expressing severe pain is very anxious.     ED Treatments / Results  Labs (all labs ordered are listed, but only abnormal results are displayed) Labs Reviewed  COMPREHENSIVE METABOLIC PANEL - Abnormal; Notable for the following:       Result Value   Glucose, Bld 104 (*)    BUN 5 (*)    ALT 16 (*)    All other components within normal limits  CBC WITH DIFFERENTIAL/PLATELET -  Abnormal; Notable for the following:    RBC 6.17 (*)    Hemoglobin 17.7 (*)    All other components within normal limits  TROPONIN I - Abnormal; Notable for the following:    Troponin I 0.11 (*)    All other components within normal limits  CSF CULTURE  GRAM STAIN  ETHANOL  CSF CELL COUNT WITH DIFFERENTIAL  CSF CELL COUNT WITH DIFFERENTIAL  GLUCOSE, CSF  PROTEIN, CSF  SEDIMENTATION RATE  URINALYSIS, ROUTINE W REFLEX MICROSCOPIC (NOT AT [facility])  RAPID URINE DRUG SCREEN, HOSP PERFORMED    EKG  EKG Interpretation  Date/Time:  Saturday November 22 2015 12:37:50 EDT Ventricular Rate:  86 PR Interval:    QRS Duration: 115 QT Interval:  433 QTC Calculation: 518 R Axis:   -66 Text Interpretation:  Sinus rhythm Incomplete right bundle branch block Inferior infarct, acute Probable anterior infarct, old much artifact, no acute ischemic appearance Confirmed by Donnald Garre, MD, Lebron Conners [phone]) on 11/22/2015 12:47:06 PM       Radiology Ct Head Wo Contrast  Result Date: 11/22/2015 CLINICAL DATA:  Bad headache since awakening early this morning. Photophobia. EXAM: CT HEAD WITHOUT CONTRAST TECHNIQUE: Contiguous axial images were obtained from the base of the skull through the vertex without intravenous contrast.  COMPARISON:  None. FINDINGS: Brain: No evidence of acute infarction, hemorrhage, hydrocephalus, extra-axial collection or mass lesion/mass effect. Vascular: No hyperdense vessel or unexpected calcification. Skull: Normal. Negative for fracture or focal lesion. Sinuses/Orbits: Normal globes and orbits. Visualized sinuses, mastoid air cells and middle ear cavities are clear. Other: None. IMPRESSION: Normal unenhanced CT scan of the brain. Electronically Signed   By: Amie Portland M.D.   On: 11/22/2015 13:36    Procedures CRITICAL CARE Performed by: Arby Barrette   Total critical care time: 60 minutes  Critical care time was exclusive of separately billable procedures and treating other patients.  Critical care was necessary to treat or prevent imminent or life-threatening deterioration.  Critical care was time spent personally by me on the following activities: development of treatment plan with patient and/or surrogate as well as nursing, discussions with consultants, evaluation of patient's response to treatment, examination of patient, obtaining history from patient or surrogate, ordering and performing treatments and interventions, ordering and review of laboratory studies, ordering and review of radiographic studies, pulse oximetry and re-evaluation of patient's condition. .Lumbar Puncture Date/Time: 11/22/2015 4:27 PM Performed by: Arby Barrette Authorized by: Arby Barrette   Consent:    Consent obtained:  Written   Consent given by:  Patient   Risks discussed:  Bleeding, headache, infection, nerve damage, pain and repeat procedure Pre-procedure details:    Procedure purpose:  Diagnostic   Preparation: Patient was prepped and draped in usual sterile fashion   Anesthesia (see MAR for exact dosages):    Anesthesia method:  Local infiltration   Local anesthetic:  Lidocaine 1% w/o epi Procedure details:    Lumbar space:  L3-L4 interspace   Patient position:  Sitting   Needle  gauge:  20   Needle type:  Spinal needle - Quincke tip   Needle length (in):  5.0   Ultrasound guidance: no     Number of attempts:  3 Post-procedure:    Puncture site:  Adhesive bandage applied Comments:     Unable to obtain return of spinal fluid. Consultation is been placed interventional radiology procedure to be performed under fluoroscopy.   (including critical care time)  Medications Ordered in ED Medications  0.9 %  sodium chloride infusion (100 mL/hr Intravenous New Bag/Given 11/22/15 1304)  lidocaine (PF) (XYLOCAINE) 1 % injection 10 mL (not administered)  lidocaine (PF) (XYLOCAINE) 1 % injection (not administered)  tetracaine (PONTOCAINE) 0.5 % ophthalmic solution 2 drop (not administered)  lidocaine (XYLOCAINE) 1 % (with pres) injection (not administered)  HYDROmorphone (DILAUDID) injection 1 mg (1 mg Intravenous Given 11/22/15 1305)  ondansetron (ZOFRAN) injection 4 mg (4 mg Intravenous Given 11/22/15 1305)  HYDROmorphone (DILAUDID) injection 1 mg (1 mg Intravenous Given 11/22/15 1539)  hydrALAZINE (APRESOLINE) injection 20 mg (20 mg Intravenous Given 11/22/15 1525)     Initial Impression / Assessment and Plan / ED Course  I have reviewed the triage vital signs and the nursing notes.  Pertinent labs & imaging results that were available during my care of the patient were reviewed by me and considered in my medical decision making (see chart for details).  Clinical Course  consult (16:31) Dr. Alben SpittleWeaver opthomology will see patient in ED.  Final Clinical Impressions(s) / ED Diagnoses   Final diagnoses:  Severe headache  Syncope, unspecified syncope type   Patient presented with severe headache, worst headache of life. Diagnostic evaluation does not show intracranial bleed per CT head. Still there is concern for possible subarachnoid hemorrhage. LP attempted by myself without success. Plan will be for LP under fluoroscopy. Patient will be seen by Dr. Alben SpittleWeaver Of ophthalmology  for increased intraocular pressure and final disposition and review of diagnostic results per Dr. Leo RodSteinel. New Prescriptions New Prescriptions   No medications on file     Arby BarretteMarcy Jacksyn Beeks, MD 12/01/15 1538

## 2015-11-26 LAB — CSF CULTURE

## 2015-11-26 LAB — CSF CULTURE W GRAM STAIN: Culture: NO GROWTH

## 2016-01-11 ENCOUNTER — Emergency Department (HOSPITAL_COMMUNITY): Payer: BLUE CROSS/BLUE SHIELD

## 2016-01-11 ENCOUNTER — Emergency Department (HOSPITAL_COMMUNITY)
Admission: EM | Admit: 2016-01-11 | Discharge: 2016-01-11 | Disposition: A | Discharge status: Home / Self Care | Payer: BLUE CROSS/BLUE SHIELD | Attending: Emergency Medicine | Admitting: Emergency Medicine

## 2016-01-11 ENCOUNTER — Encounter (HOSPITAL_COMMUNITY): Payer: Self-pay | Admitting: Oncology

## 2016-01-11 DIAGNOSIS — R0789 Other chest pain: Secondary | ICD-10-CM | POA: Insufficient documentation

## 2016-01-11 DIAGNOSIS — M542 Cervicalgia: Secondary | ICD-10-CM | POA: Insufficient documentation

## 2016-01-11 DIAGNOSIS — Z79899 Other long term (current) drug therapy: Secondary | ICD-10-CM | POA: Insufficient documentation

## 2016-01-11 DIAGNOSIS — Y999 Unspecified external cause status: Secondary | ICD-10-CM | POA: Diagnosis not present

## 2016-01-11 DIAGNOSIS — F17210 Nicotine dependence, cigarettes, uncomplicated: Secondary | ICD-10-CM | POA: Diagnosis not present

## 2016-01-11 DIAGNOSIS — R935 Abnormal findings on diagnostic imaging of other abdominal regions, including retroperitoneum: Secondary | ICD-10-CM | POA: Insufficient documentation

## 2016-01-11 DIAGNOSIS — Z7982 Long term (current) use of aspirin: Secondary | ICD-10-CM | POA: Insufficient documentation

## 2016-01-11 DIAGNOSIS — Y939 Activity, unspecified: Secondary | ICD-10-CM | POA: Diagnosis not present

## 2016-01-11 DIAGNOSIS — R930 Abnormal findings on diagnostic imaging of skull and head, not elsewhere classified: Secondary | ICD-10-CM | POA: Insufficient documentation

## 2016-01-11 DIAGNOSIS — M79604 Pain in right leg: Secondary | ICD-10-CM | POA: Diagnosis not present

## 2016-01-11 DIAGNOSIS — I10 Essential (primary) hypertension: Secondary | ICD-10-CM | POA: Insufficient documentation

## 2016-01-11 DIAGNOSIS — Y92410 Unspecified street and highway as the place of occurrence of the external cause: Secondary | ICD-10-CM | POA: Diagnosis not present

## 2016-01-11 LAB — RAPID URINE DRUG SCREEN, HOSP PERFORMED
AMPHETAMINES: NOT DETECTED
BENZODIAZEPINES: NOT DETECTED
Barbiturates: NOT DETECTED
Cocaine: NOT DETECTED
OPIATES: NOT DETECTED
Tetrahydrocannabinol: POSITIVE — AB

## 2016-01-11 LAB — I-STAT CHEM 8, ED
BUN: 8 mg/dL (ref 6–20)
CHLORIDE: 103 mmol/L (ref 101–111)
Calcium, Ion: 1.1 mmol/L — ABNORMAL LOW (ref 1.15–1.40)
Creatinine, Ser: 1 mg/dL (ref 0.61–1.24)
Glucose, Bld: 105 mg/dL — ABNORMAL HIGH (ref 65–99)
HEMATOCRIT: 53 % — AB (ref 39.0–52.0)
Hemoglobin: 18 g/dL — ABNORMAL HIGH (ref 13.0–17.0)
POTASSIUM: 3.5 mmol/L (ref 3.5–5.1)
Sodium: 142 mmol/L (ref 135–145)
TCO2: 27 mmol/L (ref 0–100)

## 2016-01-11 MED ORDER — MORPHINE SULFATE (PF) 4 MG/ML IV SOLN
4.0000 mg | Freq: Once | INTRAVENOUS | Status: AC
  Administered 2016-01-11: 4 mg via INTRAVENOUS
  Filled 2016-01-11: qty 1

## 2016-01-11 MED ORDER — CYCLOBENZAPRINE HCL 10 MG PO TABS: 10.0000 mg | ORAL_TABLET | Freq: Two times a day (BID) | ORAL | 0 refills | Status: DC | PRN

## 2016-01-11 MED ORDER — IBUPROFEN 800 MG PO TABS: 800.0000 mg | ORAL_TABLET | Freq: Three times a day (TID) | ORAL | 0 refills | Status: DC

## 2016-01-11 MED ORDER — IOPAMIDOL (ISOVUE-300) INJECTION 61%
100.0000 mL | Freq: Once | INTRAVENOUS | Status: AC | PRN
  Administered 2016-01-11: 100 mL via INTRAVENOUS

## 2016-01-11 MED ORDER — IOPAMIDOL (ISOVUE-300) INJECTION 61%
INTRAVENOUS | Status: DC
Start: 2016-01-11 — End: 2016-01-11
  Filled 2016-01-11: qty 100
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
## 2016-01-11 NOTE — ED Notes (Signed)
Patient transported to X-ray 

## 2016-01-11 NOTE — ED Notes (Signed)
Patient transported to CT 
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
## 2016-01-11 NOTE — ED Notes (Signed)
Pt ambulatory to wheelchair. 

## 2016-08-23 ENCOUNTER — Emergency Department (HOSPITAL_COMMUNITY): Payer: BLUE CROSS/BLUE SHIELD

## 2016-08-23 ENCOUNTER — Encounter (HOSPITAL_COMMUNITY): Payer: Self-pay | Admitting: Emergency Medicine

## 2016-08-23 ENCOUNTER — Observation Stay (HOSPITAL_COMMUNITY)
Admission: EM | Admit: 2016-08-23 | Discharge: 2016-08-26 | Disposition: A | Discharge status: Home / Self Care | Payer: BLUE CROSS/BLUE SHIELD | Attending: Internal Medicine | Admitting: Internal Medicine

## 2016-08-23 DIAGNOSIS — Z79899 Other long term (current) drug therapy: Secondary | ICD-10-CM | POA: Insufficient documentation

## 2016-08-23 DIAGNOSIS — R1310 Dysphagia, unspecified: Secondary | ICD-10-CM

## 2016-08-23 DIAGNOSIS — R079 Chest pain, unspecified: Principal | ICD-10-CM | POA: Insufficient documentation

## 2016-08-23 DIAGNOSIS — Z7982 Long term (current) use of aspirin: Secondary | ICD-10-CM | POA: Insufficient documentation

## 2016-08-23 DIAGNOSIS — R4182 Altered mental status, unspecified: Secondary | ICD-10-CM | POA: Diagnosis not present

## 2016-08-23 DIAGNOSIS — I200 Unstable angina: Secondary | ICD-10-CM | POA: Diagnosis present

## 2016-08-23 DIAGNOSIS — I10 Essential (primary) hypertension: Secondary | ICD-10-CM | POA: Insufficient documentation

## 2016-08-23 DIAGNOSIS — I428 Other cardiomyopathies: Secondary | ICD-10-CM

## 2016-08-23 DIAGNOSIS — F17210 Nicotine dependence, cigarettes, uncomplicated: Secondary | ICD-10-CM | POA: Diagnosis not present

## 2016-08-23 DIAGNOSIS — R0789 Other chest pain: Secondary | ICD-10-CM | POA: Diagnosis present

## 2016-08-23 LAB — I-STAT TROPONIN, ED: Troponin i, poc: 0.04 ng/mL (ref 0.00–0.08)

## 2016-08-23 LAB — BASIC METABOLIC PANEL
Anion gap: 7 (ref 5–15)
BUN: 8 mg/dL (ref 6–20)
CALCIUM: 8.8 mg/dL — AB (ref 8.9–10.3)
CO2: 25 mmol/L (ref 22–32)
Chloride: 106 mmol/L (ref 101–111)
Creatinine, Ser: 0.96 mg/dL (ref 0.61–1.24)
GFR calc Af Amer: >60 mL/min (ref >60)
Glucose, Bld: 122 mg/dL — ABNORMAL HIGH (ref 65–99)
Potassium: 4 mmol/L (ref 3.5–5.1)
Sodium: 138 mmol/L (ref 135–145)

## 2016-08-23 LAB — CBC
HEMATOCRIT: 44.4 % (ref 39.0–52.0)
HEMOGLOBIN: 15.1 g/dL (ref 13.0–17.0)
MCH: 28.1 pg (ref 26.0–34.0)
MCHC: 34 g/dL (ref 30.0–36.0)
MCV: 82.7 fL (ref 78.0–100.0)
Platelets: 189 10*3/uL (ref 150–400)
RBC: 5.37 MIL/uL (ref 4.22–5.81)
RDW: 14.7 % (ref 11.5–15.5)
WBC: 7.2 10*3/uL (ref 4.0–10.5)

## 2016-08-23 LAB — I-STAT VENOUS BLOOD GAS, ED
ACID-BASE EXCESS: 3 mmol/L — AB (ref 0.0–2.0)
Bicarbonate: 29.2 mmol/L — ABNORMAL HIGH (ref 20.0–28.0)
O2 SAT: 93 %
TCO2: 31 mmol/L (ref 0–100)
pCO2, Ven: 48.8 mmHg (ref 44.0–60.0)
pH, Ven: 7.385 (ref 7.250–7.430)
pO2, Ven: 68 mmHg — ABNORMAL HIGH (ref 32.0–45.0)

## 2016-08-23 LAB — ETHANOL: Alcohol, Ethyl (B): <5 mg/dL (ref <5)

## 2016-08-23 LAB — AMMONIA: AMMONIA: 40 umol/L — AB (ref 9–35)

## 2016-08-23 NOTE — ED Notes (Signed)
Pt back from radiology.  Made aware of need for urine sample.  Pt fell asleep shortly after and is snoring in bed. NAD

## 2016-08-23 NOTE — ED Notes (Signed)
Patient in radiology

## 2016-08-23 NOTE — ED Notes (Signed)
Patient transported to CT 

## 2016-08-23 NOTE — ED Provider Notes (Signed)
[facility]-EMERGENCY DEPT Provider Note   CSN: [id number] Arrival date & time: 08/23/16  2002     History   Chief Complaint Chief Complaint  Patient presents with  . Chest Pain  . Weakness  . Numbness    HPI Kenneth Gould is a 51 y.o. male.  HPI  Patient, with a past medical history of hypertension, nonischemic cardiomyopathy, presents to ED for evaluation of sudden onset left-sided chest pain radiating to left arm as well as generalized weakness. States that the chest pain has improved since onset. He states that the entire left side of his body feels weak. He also reports intermittent shortness of breath as well as dizziness. He also reports difficulty with urination. He denies any abdominal pain, vomiting, diarrhea, hemoptysis, recent surgeries, prior CVA, prior MI, DVT or PE.  Past Medical History:  Diagnosis Date  . Essential hypertension 10/24/2014  . Family history of premature CAD   . NICM (nonischemic cardiomyopathy) (HCC)    a. 10/2014 Cath: nl cors, EF 35-45%, glob HK.  . Obesity (BMI 30-39.9)     Patient Active Problem List   Diagnosis Date Noted  . Chest pain   . Unstable angina pectoris (HCC) 10/25/2014  . NICM (nonischemic cardiomyopathy) (HCC) 10/25/2014  . Chest pain with moderate risk for cardiac etiology 10/24/2014  . Essential hypertension 10/24/2014  . Chest pain, moderate coronary artery risk 10/24/2014    Past Surgical History:  Procedure Laterality Date  . CARDIAC CATHETERIZATION N/A 10/25/2014   Procedure: Left Heart Cath and Coronary Angiography;  Surgeon: Lyn Records, MD;  Location: [facility] INVASIVE CV LAB;  Service: Cardiovascular;  Laterality: N/A;  . None         Home Medications    Prior to Admission medications   Medication Sig Start Date End Date Taking? Authorizing Provider  aspirin 81 MG chewable tablet Chew 1 tablet (81 mg total) by mouth daily. 01/23/15   Little, Ambrose Finland, MD  carvedilol (COREG) 3.125 MG tablet Take 1 tablet  (3.125 mg total) by mouth 2 (two) times daily with a meal. Patient not taking: Reported on 01/11/2016 01/23/15   Little, Ambrose Finland, MD  cyclobenzaprine (FLEXERIL) 10 MG tablet Take 1 tablet (10 mg total) by mouth 2 (two) times daily as needed for muscle spasms. 01/11/16   Fayrene Helper, PA-C  famotidine (PEPCID) 20 MG tablet Take 1 tablet (20 mg total) by mouth 2 (two) times daily. Patient not taking: Reported on 01/11/2016 01/23/15   Little, Ambrose Finland, MD  ibuprofen (ADVIL,MOTRIN) 800 MG tablet Take 1 tablet (800 mg total) by mouth 3 (three) times daily. 01/11/16   Fayrene Helper, PA-C  lisinopril (PRINIVIL,ZESTRIL) 2.5 MG tablet Take 1 tablet (2.5 mg total) by mouth daily. Patient not taking: Reported on 01/11/2016 01/23/15   Little, Ambrose Finland, MD  lisinopril-hydrochlorothiazide (PRINZIDE,ZESTORETIC) 20-12.5 MG tablet Take 1 tablet by mouth daily. 11/22/15   Cathren Laine, MD    Family History Family History  Problem Relation Age of Onset  . Heart attack Sister 19       Died in her sleep, dx at autopsy  . Heart attack Cousin 50       Died in his sleep    Social History Social History  Substance Use Topics  . Smoking status: Current Every Day Smoker    Packs/day: 0.50    Years: 30.00    Types: Cigarettes  . Smokeless tobacco: Never Used  . Alcohol use No     Allergies  Other   Review of Systems Review of Systems  Constitutional: Positive for fatigue. Negative for appetite change, chills and fever.  HENT: Negative for ear pain, rhinorrhea, sneezing and sore throat.   Eyes: Negative for photophobia and visual disturbance.  Respiratory: Negative for cough, chest tightness, shortness of breath and wheezing.   Cardiovascular: Positive for chest pain. Negative for palpitations.  Gastrointestinal: Negative for abdominal pain, blood in stool, constipation, diarrhea, nausea and vomiting.  Genitourinary: Negative for dysuria, hematuria and urgency.  Musculoskeletal: Negative for  myalgias.  Skin: Negative for rash.  Neurological: Positive for weakness and light-headedness. Negative for dizziness, syncope and headaches.     Physical Exam Updated Vital Signs BP 128/77   Pulse 65   Temp 99.1 F (37.3 C) (Oral)   Resp 20   Ht 5\' 8"  (1.727 m)   Wt 136.1 kg (300 lb)   SpO2 93%   BMI 45.61 kg/m   Physical Exam  Constitutional: He is oriented to person, place, and time. He appears well-developed and well-nourished. No distress.  Patient appears drowsy but arousable.  HENT:  Head: Normocephalic and atraumatic.  Nose: Nose normal.  Eyes: Conjunctivae and EOM are normal. Left eye exhibits no discharge. No scleral icterus.  Neck: Normal range of motion. Neck supple.  Cardiovascular: Normal rate, regular rhythm, normal heart sounds and intact distal pulses.  Exam reveals no gallop and no friction rub.   No murmur heard. Pulmonary/Chest: Effort normal and breath sounds normal. No respiratory distress.    Tenderness to palpation in the indicated area.  Abdominal: Soft. Bowel sounds are normal. He exhibits no distension. There is no tenderness. There is no guarding.  Musculoskeletal: Normal range of motion. He exhibits no edema.  Neurological: He is alert and oriented to person, place, and time. No cranial nerve deficit or sensory deficit. He exhibits normal muscle tone. Coordination normal.  Decreased grip strength on left side. However patient is able to extend both arms equally. Sensation intact to light touch in all 4 extremities.  Skin: Skin is warm and dry. No rash noted.  Psychiatric: He has a normal mood and affect.  Nursing note and vitals reviewed.    ED Treatments / Results  Labs (all labs ordered are listed, but only abnormal results are displayed) Labs Reviewed  BASIC METABOLIC PANEL - Abnormal; Notable for the following:       Result Value   Glucose, Bld 122 (*)    Calcium 8.8 (*)    All other components within normal limits  RAPID URINE DRUG  SCREEN, HOSP PERFORMED - Abnormal; Notable for the following:    Tetrahydrocannabinol POSITIVE (*)    All other components within normal limits  AMMONIA - Abnormal; Notable for the following:    Ammonia 40 (*)    All other components within normal limits  I-STAT VENOUS BLOOD GAS, ED - Abnormal; Notable for the following:    pO2, Ven 68.0 (*)    Bicarbonate 29.2 (*)    Acid-Base Excess 3.0 (*)    All other components within normal limits  CBC  ETHANOL  BLOOD GAS, VENOUS  I-STAT TROPONIN, ED    EKG  EKG Interpretation None       Radiology Dg Chest 2 View  Result Date: 08/23/2016 CLINICAL DATA:  51 y/o M; sudden onset of chest pressure and global weakness. EXAM: CHEST  2 VIEW COMPARISON:  01/22/2015 chest radiograph. FINDINGS: Mild cardiomegaly. Diffuse hazy opacification of lungs probably representing mild pulmonary edema. No focal  consolidation. No pleural effusion. No pneumothorax. Bones are unremarkable. IMPRESSION: Cardiomegaly and diffuse hazy opacification of lungs probably representing mild pulmonary edema. Electronically Signed   By: Mitzi Hansen M.D.   On: 08/23/2016 22:37   Ct Head Wo Contrast  Result Date: 08/23/2016 CLINICAL DATA:  Essential hypertension, subacute neurologic deficits. EXAM: CT HEAD WITHOUT CONTRAST TECHNIQUE: Contiguous axial images were obtained from the base of the skull through the vertex without intravenous contrast. COMPARISON:  01/11/2016 FINDINGS: Brain: No evidence of acute infarction, hemorrhage, hydrocephalus, extra-axial collection or mass lesion/mass effect. Stable dural calcifications. Vascular: No hyperdense vessel or unexpected calcification. Skull: Normal. Negative for fracture or focal lesion. Sinuses/Orbits: Minimal frontal sinus mucosal thickening on the right. No acute sinus disease. Intact orbits and globes. Other: None IMPRESSION: No acute intracranial abnormality.  No significant change. Electronically Signed   By: Tollie Eth  M.D.   On: 08/23/2016 21:24    Procedures Procedures (including critical care time)  Medications Ordered in ED Medications - No data to display   Initial Impression / Assessment and Plan / ED Course  I have reviewed the triage vital signs and the nursing notes.  Pertinent labs & imaging results that were available during my care of the patient were reviewed by me and considered in my medical decision making (see chart for details).     Patient presents to ED for evaluation of sudden onset left-sided chest pain radiating to left arm as well as generalized weak this. States that the entire left side of his body feels weak. Patient is drowsy and falling asleep intermittently throughout exam. He is arousable. He is not tachycardic or tachypnec. BMP, CBC, troponin negative. Ethanol normal. Ammonia elevated to 40. UDS positive for THC. VBG showed pO2 68, bicarb 29. Patient does admit to daily marijuana use as well as PCP use recently. Chest x-ray showed mild pulmonary edema. CT of the head was negative.  Will wait for patient to sober up before discharge. Care resumed by oncoming provider pending sobriety assessment.  Final Clinical Impressions(s) / ED Diagnoses   Final diagnoses:  None    New Prescriptions New Prescriptions   No medications on file     Dietrich Pates, PA-C 08/24/16 0059    Abelino Derrick, MD 08/24/16 [phone]

## 2016-08-23 NOTE — ED Triage Notes (Signed)
Pt BIB [facility] for sudden onset of chest pressure and global weakness. Stated he was having numbness in his left hand a foot. The chest pressure rad to left arm. Pt did experience SHOB and dizziness. EMS gave 1 nitro with minimal relief and 324 ASA

## 2016-08-24 ENCOUNTER — Encounter (HOSPITAL_COMMUNITY): Payer: Self-pay | Admitting: Physician Assistant

## 2016-08-24 ENCOUNTER — Emergency Department (HOSPITAL_COMMUNITY): Payer: BLUE CROSS/BLUE SHIELD

## 2016-08-24 DIAGNOSIS — I428 Other cardiomyopathies: Secondary | ICD-10-CM | POA: Diagnosis not present

## 2016-08-24 DIAGNOSIS — I5022 Chronic systolic (congestive) heart failure: Secondary | ICD-10-CM | POA: Insufficient documentation

## 2016-08-24 DIAGNOSIS — I10 Essential (primary) hypertension: Secondary | ICD-10-CM | POA: Diagnosis not present

## 2016-08-24 DIAGNOSIS — R0789 Other chest pain: Secondary | ICD-10-CM | POA: Diagnosis not present

## 2016-08-24 DIAGNOSIS — R079 Chest pain, unspecified: Secondary | ICD-10-CM | POA: Diagnosis not present

## 2016-08-24 LAB — HEPARIN LEVEL (UNFRACTIONATED): Heparin Unfractionated: 0.1 IU/mL — ABNORMAL LOW (ref 0.30–0.70)

## 2016-08-24 LAB — BRAIN NATRIURETIC PEPTIDE: B NATRIURETIC PEPTIDE 5: 51.8 pg/mL (ref 0.0–100.0)

## 2016-08-24 LAB — LIPID PANEL
CHOL/HDL RATIO: 3.4 ratio
Cholesterol: 168 mg/dL (ref 0–200)
HDL: 50 mg/dL (ref >40)
LDL CALC: 106 mg/dL — AB (ref 0–99)
Triglycerides: 62 mg/dL (ref <150)
VLDL: 12 mg/dL (ref 0–40)

## 2016-08-24 LAB — URINALYSIS, ROUTINE W REFLEX MICROSCOPIC
BACTERIA UA: NONE SEEN
Bilirubin Urine: NEGATIVE
Glucose, UA: NEGATIVE mg/dL
HGB URINE DIPSTICK: NEGATIVE
Ketones, ur: NEGATIVE mg/dL
LEUKOCYTES UA: NEGATIVE
Nitrite: NEGATIVE
PROTEIN: 30 mg/dL — AB
SPECIFIC GRAVITY, URINE: 1.028 (ref 1.005–1.030)
SQUAMOUS EPITHELIAL / LPF: NONE SEEN
pH: 5 (ref 5.0–8.0)

## 2016-08-24 LAB — TROPONIN I
TROPONIN I: 0.07 ng/mL — AB (ref <0.03)
TROPONIN I: 0.08 ng/mL — AB (ref <0.03)
Troponin I: 0.08 ng/mL (ref <0.03)
Troponin I: 0.07 ng/mL (ref <0.03)

## 2016-08-24 LAB — HEPATIC FUNCTION PANEL
ALK PHOS: 78 U/L (ref 38–126)
ALT: 11 U/L — AB (ref 17–63)
AST: 17 U/L (ref 15–41)
Albumin: 3.4 g/dL — ABNORMAL LOW (ref 3.5–5.0)
BILIRUBIN DIRECT: 0.3 mg/dL (ref 0.1–0.5)
BILIRUBIN INDIRECT: 0.9 mg/dL (ref 0.3–0.9)
BILIRUBIN TOTAL: 1.2 mg/dL (ref 0.3–1.2)
Total Protein: 6.8 g/dL (ref 6.5–8.1)

## 2016-08-24 LAB — RAPID URINE DRUG SCREEN, HOSP PERFORMED
AMPHETAMINES: NOT DETECTED
BARBITURATES: NOT DETECTED
BENZODIAZEPINES: NOT DETECTED
COCAINE: NOT DETECTED
Opiates: NOT DETECTED
Tetrahydrocannabinol: POSITIVE — AB

## 2016-08-24 LAB — HIV ANTIBODY (ROUTINE TESTING W REFLEX): HIV Screen 4th Generation wRfx: NONREACTIVE

## 2016-08-24 MED ORDER — IOPAMIDOL (ISOVUE-370) INJECTION 76%
INTRAVENOUS | Status: AC
  Administered 2016-08-24: 100 mL
  Filled 2016-08-24: qty 100

## 2016-08-24 MED ORDER — MORPHINE SULFATE (PF) 4 MG/ML IV SOLN: 1.0000 mg | INTRAVENOUS | Status: DC | PRN

## 2016-08-24 MED ORDER — ONDANSETRON HCL 4 MG/2ML IJ SOLN: 4.0000 mg | Freq: Four times a day (QID) | INTRAMUSCULAR | Status: DC | PRN

## 2016-08-24 MED ORDER — ASPIRIN 81 MG PO CHEW
324.0000 mg | CHEWABLE_TABLET | Freq: Once | ORAL | Status: AC
  Administered 2016-08-24: 324 mg via ORAL
  Filled 2016-08-24: qty 4

## 2016-08-24 MED ORDER — MORPHINE SULFATE (PF) 4 MG/ML IV SOLN
2.0000 mg | INTRAVENOUS | Status: DC | PRN
  Administered 2016-08-24: 2 mg via INTRAVENOUS
  Filled 2016-08-24: qty 1

## 2016-08-24 MED ORDER — METHOCARBAMOL 500 MG PO TABS
500.0000 mg | ORAL_TABLET | Freq: Three times a day (TID) | ORAL | Status: DC | PRN
  Administered 2016-08-24: 500 mg via ORAL
  Filled 2016-08-24: qty 1

## 2016-08-24 MED ORDER — LISINOPRIL 20 MG PO TABS
20.0000 mg | ORAL_TABLET | Freq: Every day | ORAL | Status: DC
  Administered 2016-08-24 – 2016-08-26 (×3): 20 mg via ORAL
  Filled 2016-08-24 (×3): qty 1

## 2016-08-24 MED ORDER — HYDROCHLOROTHIAZIDE 12.5 MG PO CAPS
12.5000 mg | ORAL_CAPSULE | Freq: Every day | ORAL | Status: DC
  Administered 2016-08-24 – 2016-08-26 (×3): 12.5 mg via ORAL
  Filled 2016-08-24 (×3): qty 1

## 2016-08-24 MED ORDER — SODIUM CHLORIDE 0.9 % IV SOLN
INTRAVENOUS | Status: DC
  Administered 2016-08-24: 08:00:00 via INTRAVENOUS

## 2016-08-24 MED ORDER — HEPARIN BOLUS VIA INFUSION
3000.0000 [IU] | Freq: Once | INTRAVENOUS | Status: AC
  Administered 2016-08-24: 3000 [IU] via INTRAVENOUS
  Filled 2016-08-24: qty 3000

## 2016-08-24 MED ORDER — GI COCKTAIL ~~LOC~~: 30.0000 mL | Freq: Four times a day (QID) | ORAL | Status: DC | PRN

## 2016-08-24 MED ORDER — NITROGLYCERIN 0.4 MG SL SUBL
0.4000 mg | SUBLINGUAL_TABLET | SUBLINGUAL | Status: DC | PRN
  Administered 2016-08-24 (×2): 0.4 mg via SUBLINGUAL
  Filled 2016-08-24: qty 1

## 2016-08-24 MED ORDER — HEPARIN (PORCINE) IN NACL 100-0.45 UNIT/ML-% IJ SOLN
1400.0000 [IU]/h | INTRAMUSCULAR | Status: DC
  Administered 2016-08-24: 1000 [IU]/h via INTRAVENOUS
  Filled 2016-08-24 (×3): qty 250

## 2016-08-24 MED ORDER — LISINOPRIL-HYDROCHLOROTHIAZIDE 20-12.5 MG PO TABS: 1.0000 | ORAL_TABLET | Freq: Every day | ORAL | Status: DC

## 2016-08-24 MED ORDER — HYDRALAZINE HCL 20 MG/ML IJ SOLN
5.0000 mg | Freq: Three times a day (TID) | INTRAMUSCULAR | Status: DC | PRN
  Administered 2016-08-24: 5 mg via INTRAVENOUS
  Filled 2016-08-24: qty 1

## 2016-08-24 MED ORDER — MORPHINE SULFATE (PF) 4 MG/ML IV SOLN
4.0000 mg | INTRAVENOUS | Status: DC | PRN
  Administered 2016-08-24: 4 mg via INTRAVENOUS
  Filled 2016-08-24: qty 1

## 2016-08-24 MED ORDER — CARVEDILOL 3.125 MG PO TABS
3.1250 mg | ORAL_TABLET | Freq: Two times a day (BID) | ORAL | Status: DC
  Filled 2016-08-24: qty 1

## 2016-08-24 MED ORDER — ACETAMINOPHEN 325 MG PO TABS: 650.0000 mg | ORAL_TABLET | ORAL | Status: DC | PRN

## 2016-08-24 MED ORDER — LISINOPRIL 2.5 MG PO TABS: 2.5000 mg | ORAL_TABLET | Freq: Every day | ORAL | Status: DC

## 2016-08-24 MED ORDER — HEPARIN BOLUS VIA INFUSION
4000.0000 [IU] | Freq: Once | INTRAVENOUS | Status: AC
  Administered 2016-08-24: 4000 [IU] via INTRAVENOUS
  Filled 2016-08-24: qty 4000

## 2016-08-24 MED ORDER — ALPRAZOLAM 0.25 MG PO TABS
0.2500 mg | ORAL_TABLET | Freq: Two times a day (BID) | ORAL | Status: DC | PRN
  Administered 2016-08-24: 0.25 mg via ORAL
  Filled 2016-08-24: qty 1

## 2016-08-24 MED ORDER — FAMOTIDINE 20 MG PO TABS
20.0000 mg | ORAL_TABLET | Freq: Two times a day (BID) | ORAL | Status: DC
  Administered 2016-08-24 – 2016-08-26 (×5): 20 mg via ORAL
  Filled 2016-08-24 (×5): qty 1

## 2016-08-24 MED ORDER — KETOROLAC TROMETHAMINE 30 MG/ML IJ SOLN
30.0000 mg | Freq: Three times a day (TID) | INTRAMUSCULAR | Status: DC | PRN
Start: 2016-08-24 — End: 2016-08-26
  Administered 2016-08-24 (×2): 30 mg via INTRAVENOUS
  Filled 2016-08-24 (×2): qty 1

## 2016-08-24 MED ORDER — ASPIRIN 81 MG PO CHEW
81.0000 mg | CHEWABLE_TABLET | Freq: Every day | ORAL | Status: DC
  Administered 2016-08-24 – 2016-08-26 (×3): 81 mg via ORAL
  Filled 2016-08-24 (×3): qty 1

## 2016-08-24 NOTE — Progress Notes (Signed)
  ANTICOAGULATION CONSULT NOTE - Initial Consult  Pharmacy Consult for Heparin Indication: chest pain/ACS  Allergies  Allergen Reactions  . Other     Nyquill    Patient Measurements: Height: 5\' 8"  (172.7 cm) Weight: 300 lb (136.1 kg) IBW/kg (Calculated) : 68.4 Heparin Dosing Weight: 100 kg  Vital Signs: Temp: 96.9 F (36.1 C) (08/07 0135) Temp Source: Rectal (08/07 0135) BP: 155/90 (08/07 0615) Pulse Rate: 68 (08/07 0615)  Labs:  Recent Labs  08/23/16 2009 08/24/16 0248  HGB 15.1  --   HCT 44.4  --   PLT 189  --   CREATININE 0.96  --   TROPONINI  --  0.08*    Estimated Creatinine Clearance: 124.3 mL/min (by C-G formula based on SCr of 0.96 mg/dL).   Medical History: Past Medical History:  Diagnosis Date  . Essential hypertension 10/24/2014  . Family history of premature CAD   . NICM (nonischemic cardiomyopathy) (HCC)    a. 10/2014 Cath: nl cors, EF 35-45%, glob HK.  . Obesity (BMI 30-39.9)     Medications:  No current facility-administered medications on file prior to encounter.    Current Outpatient Prescriptions on File Prior to Encounter  Medication Sig Dispense Refill  . aspirin 81 MG chewable tablet Chew 1 tablet (81 mg total) by mouth daily. 30 tablet 0  . carvedilol (COREG) 3.125 MG tablet Take 1 tablet (3.125 mg total) by mouth 2 (two) times daily with a meal. (Patient not taking: Reported on 01/11/2016) 60 tablet 0  . cyclobenzaprine (FLEXERIL) 10 MG tablet Take 1 tablet (10 mg total) by mouth 2 (two) times daily as needed for muscle spasms. 20 tablet 0  . famotidine (PEPCID) 20 MG tablet Take 1 tablet (20 mg total) by mouth 2 (two) times daily. (Patient not taking: Reported on 01/11/2016) 60 tablet 0  . ibuprofen (ADVIL,MOTRIN) 800 MG tablet Take 1 tablet (800 mg total) by mouth 3 (three) times daily. 21 tablet 0  . lisinopril (PRINIVIL,ZESTRIL) 2.5 MG tablet Take 1 tablet (2.5 mg total) by mouth daily. (Patient not taking: Reported on 01/11/2016)  30 tablet 0  . lisinopril-hydrochlorothiazide (PRINZIDE,ZESTORETIC) 20-12.5 MG tablet Take 1 tablet by mouth daily. 30 tablet 0     Assessment: 51 y.o. male with chest pain for heparin  Goal of Therapy:  Heparin level 0.3-0.7 units/ml Monitor platelets by anticoagulation protocol: Yes   Plan:  Heparin 4000 units IV bolus, then start heparin 1600 units/hr Check heparin level in 6 hours.   Eddie Candle 08/24/2016,6:36 AM

## 2016-08-24 NOTE — Progress Notes (Signed)
This is a no charge note  Pending admission per PA, Hedges  51 year old man with past medical history of hypertension, GERD, NICM, obesity, family history of premature CAD, who presents with chest pain. Patient also had episode of left hand and foot numbness. Patient is drowsy per ED physician. Chest pain is associated with shortness breath and dizziness, radiating to the left arm. Patient was found to have troponin 0.08. CT head is negative. CT angiogram chest is negative for PE. Patient is admitted to telemetry bed as inpatient. IV heparin was started.   Lorretta Harp, MD  [facility] Pager [phone]  If 7PM-7AM, please contact night-coverage [email or web address] Password [email or web address] 08/24/2016, 6:11 AM

## 2016-08-24 NOTE — H&P (Signed)
History and Physical    Kenneth Gould VEH:[id number] DOB: 1965/11/13 DOA: 08/23/2016   PCP: Patient, No Pcp Per   Patient coming from:  Home    Chief Complaint: Chest pain   HPI: Kenneth Gould is a 51 y.o. male with medical history significant for HTN, NICM per cardiac cath 10/2014 with patent coronary arteries and possible heart failure, EF 35-35%, family history of premature heart disease, obesity, chronic headaches, marijuana use (last 2 days ago), presenting tot the ED with recurrent L sided chest pain, worse on exertion, similar to other episodes, last in Nov 2017. He also reported some shortness of breath with these episodes and this pain is worse with deep inspiration He was placed on ASA and NTG, with some improvement of symptoms but not resolved.  He feels very fatigued and hyper somnolent (he reports he is a heavy "snorer" )  . Denies abdominal pain, nausea, vomiting or diarrhea. He denies any bleeding issues. He denies any leg swelling. No calf pain. She reports intermittent headaches,  what appears to be lacunar in nature as it localizes are on the left side in the left side of his head, then results after a few minutes. He denies any recent surgeries.  He denies a history of PE, DVT, or CVA. Of note, he admits to not taking his medications on a regular basis. He discontinued smoking about one year ago. He denies any significant amount of alcohol. He denies any cocaine and crack.  Patient does not follow as OP with Cardiology.    ED Course:  BP (!) 139/96   Pulse (!) 57   Temp (!) 96.9 F (36.1 C) (Rectal)   Resp 14   Ht 5\' 8"  (1.727 m)   Wt 136.1 kg (300 lb)   SpO2 98%   BMI 45.61 kg/m    THC + Ammonia 40 Glu 122 ETOH neg  TN from 0.04 last night to 0.08 this morning EKG Sinus rhythm, Left anterior fascicular block, Left ventricular hypertrophy,Nonspecific T abnormalities, inferior leads CT head neg CT angio neg for PE  CXR possible mild pulmonary edema  Review of  Systems:  As per HPI otherwise all other systems reviewed and are negative  Past Medical History:  Diagnosis Date  . Essential hypertension 10/24/2014  . Family history of premature CAD   . NICM (nonischemic cardiomyopathy) (HCC)    a. 10/2014 Cath: nl cors, EF 35-45%, glob HK.  . Obesity (BMI 30-39.9)     Past Surgical History:  Procedure Laterality Date  . CARDIAC CATHETERIZATION N/A 10/25/2014   Procedure: Left Heart Cath and Coronary Angiography;  Surgeon: Lyn Records, MD;  Location: [facility] INVASIVE CV LAB;  Service: Cardiovascular;  Laterality: N/A;  . None      Social History Social History   Social History  . Marital status: Married    Spouse name: N/A  . Number of children: N/A  . 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 Smoking status: Former Smoker    Packs/day: 0.50    Years: 30.00    Types: Cigarettes    Quit date: 08/25/2015  . Smokeless tobacco: Never Used  . Alcohol use No  . Drug use: Yes    Types: Marijuana     Comment: THC  . Sexual activity: Yes   Other Topics Concern  . Not on file   Social History Narrative  . No narrative on file  Allergies  Allergen Reactions  . Other     Nyquill    Family History  Problem Relation Age of Onset  . Heart attack Sister 32       Died in her sleep, dx at autopsy  . Heart attack Cousin 68       Died in his sleep      Prior to Admission medications   Medication Sig Start Date End Date Taking? Authorizing Provider  aspirin 81 MG chewable tablet Chew 1 tablet (81 mg total) by mouth daily. 01/23/15   Little, Ambrose Finland, MD  carvedilol (COREG) 3.125 MG tablet Take 1 tablet (3.125 mg total) by mouth 2 (two) times daily with a meal. Patient not taking: Reported on 01/11/2016 01/23/15   Little, Ambrose Finland, MD  cyclobenzaprine (FLEXERIL) 10 MG tablet Take 1 tablet (10 mg total) by mouth 2 (two) times daily as needed for muscle spasms. 01/11/16    Fayrene Helper, PA-C  famotidine (PEPCID) 20 MG tablet Take 1 tablet (20 mg total) by mouth 2 (two) times daily. Patient not taking: Reported on 01/11/2016 01/23/15   Little, Ambrose Finland, MD  ibuprofen (ADVIL,MOTRIN) 800 MG tablet Take 1 tablet (800 mg total) by mouth 3 (three) times daily. 01/11/16   Fayrene Helper, PA-C  lisinopril (PRINIVIL,ZESTRIL) 2.5 MG tablet Take 1 tablet (2.5 mg total) by mouth daily. Patient not taking: Reported on 01/11/2016 01/23/15   Little, Ambrose Finland, MD  lisinopril-hydrochlorothiazide (PRINZIDE,ZESTORETIC) 20-12.5 MG tablet Take 1 tablet by mouth daily. 11/22/15   Cathren Laine, MD    Physical Exam:  Vitals:   08/24/16 0545 08/24/16 0600 08/24/16 0615 08/24/16 0700  BP: (!) 132/91 (!) 136/98 (!) 155/90 (!) 139/96  Pulse: (!) 59 (!) 57 68 (!) 57  Resp:      Temp:      TempSrc:      SpO2: 95% 96% 93% 98%  Weight:      Height:       Constitutional: NAD, calm, heavily snoring but able to rouse easily, responding yes or no to questions  Eyes: PERRL, lids and conjunctivae normal ENMT: Mucous membranes are moist, without exudate or lesions  Neck: normal, supple, no masses, no thyromegaly Respiratory: clear to auscultation bilaterally, no wheezing, no crackles. Normal respiratory effort  Cardiovascular: Regular rate and rhythm, occasional ectopic beat, distant sounds, no murmurs, rubs or gallops. No extremity edema. 2+ pedal pulses. No carotid bruits.  Abdomen: Soft, non tender, No hepatosplenomegaly. Bowel sounds positive.  Musculoskeletal: no clubbing / cyanosis. Moves all extremities. Tender to palpation on the left upper chest  Skin: no jaundice, No lesions.  Neurologic: Sensation intact  Strength equal in all extremities Psychiatric:    Somnolent but able to rouse easily to voice and touch     Labs on Admission: I have personally reviewed following labs and imaging studies  CBC:  Recent Labs Lab 08/23/16 2009  WBC 7.2  HGB 15.1  HCT 44.4  MCV 82.7   PLT 189    Basic Metabolic Panel:  Recent Labs Lab 08/23/16 2009  NA 138  K 4.0  CL 106  CO2 25  GLUCOSE 122*  BUN 8  CREATININE 0.96  CALCIUM 8.8*    GFR: Estimated Creatinine Clearance: 124.3 mL/min (by C-G formula based on SCr of 0.96 mg/dL).  Liver Function Tests:  Recent Labs Lab 08/24/16 0248  AST 17  ALT 11*  ALKPHOS 78  BILITOT 1.2  PROT 6.8  ALBUMIN 3.4*   No results for  input(s): LIPASE, AMYLASE in the last 168 hours.  Recent Labs Lab 08/23/16 2208  AMMONIA 40*    Coagulation Profile: No results for input(s): INR, PROTIME in the last 168 hours.  Cardiac Enzymes:  Recent Labs Lab 08/24/16 0248  TROPONINI 0.08*    BNP (last 3 results) No results for input(s): PROBNP in the last 8760 hours.  HbA1C: No results for input(s): HGBA1C in the last 72 hours.  CBG: No results for input(s): GLUCAP in the last 168 hours.  Lipid Profile: No results for input(s): CHOL, HDL, LDLCALC, TRIG, CHOLHDL, LDLDIRECT in the last 72 hours.  Thyroid Function Tests: No results for input(s): TSH, T4TOTAL, FREET4, T3FREE, THYROIDAB in the last 72 hours.  Anemia Panel: No results for input(s): VITAMINB12, FOLATE, FERRITIN, TIBC, IRON, RETICCTPCT in the last 72 hours.  Urine analysis:    Component Value Date/Time   COLORURINE AMBER (A) 08/24/2016 0151   APPEARANCEUR CLEAR 08/24/2016 0151   LABSPEC 1.028 08/24/2016 0151   PHURINE 5.0 08/24/2016 0151   GLUCOSEU NEGATIVE 08/24/2016 0151   HGBUR NEGATIVE 08/24/2016 0151   BILIRUBINUR NEGATIVE 08/24/2016 0151   KETONESUR NEGATIVE 08/24/2016 0151   PROTEINUR 30 (A) 08/24/2016 0151   NITRITE NEGATIVE 08/24/2016 0151   LEUKOCYTESUR NEGATIVE 08/24/2016 0151    Sepsis Labs: @LABRCNTIP (procalcitonin:4,lacticidven:4) )No results found for this or any previous visit (from the past 240 hour(s)).   Radiological Exams on Admission: Dg Chest 2 View  Result Date: 08/23/2016 CLINICAL DATA:  51 y/o M; sudden onset  of chest pressure and global weakness. EXAM: CHEST  2 VIEW COMPARISON:  01/22/2015 chest radiograph. FINDINGS: Mild cardiomegaly. Diffuse hazy opacification of lungs probably representing mild pulmonary edema. No focal consolidation. No pleural effusion. No pneumothorax. Bones are unremarkable. IMPRESSION: Cardiomegaly and diffuse hazy opacification of lungs probably representing mild pulmonary edema. Electronically Signed   By: Mitzi Hansen M.D.   On: 08/23/2016 22:37   Ct Head Wo Contrast  Result Date: 08/23/2016 CLINICAL DATA:  Essential hypertension, subacute neurologic deficits. EXAM: CT HEAD WITHOUT CONTRAST TECHNIQUE: Contiguous axial images were obtained from the base of the skull through the vertex without intravenous contrast. COMPARISON:  01/11/2016 FINDINGS: Brain: No evidence of acute infarction, hemorrhage, hydrocephalus, extra-axial collection or mass lesion/mass effect. Stable dural calcifications. Vascular: No hyperdense vessel or unexpected calcification. Skull: Normal. Negative for fracture or focal lesion. Sinuses/Orbits: Minimal frontal sinus mucosal thickening on the right. No acute sinus disease. Intact orbits and globes. Other: None IMPRESSION: No acute intracranial abnormality.  No significant change. Electronically Signed   By: Tollie Eth M.D.   On: 08/23/2016 21:24   Ct Angio Chest Pe W And/or Wo Contrast  Result Date: 08/24/2016 CLINICAL DATA:  Initial evaluation for acute chest pressure radiating to left arm. EXAM: CT ANGIOGRAPHY CHEST WITH CONTRAST TECHNIQUE: Multidetector CT imaging of the chest was performed using the standard protocol during bolus administration of intravenous contrast. Multiplanar CT image reconstructions and MIPs were obtained to evaluate the vascular anatomy. CONTRAST:  100 cc of Isovue 370. COMPARISON:  Prior radiograph from 08/23/2016. FINDINGS: Cardiovascular: Intrathoracic aorta up of normal caliber without abnormality. Partially visualized  great vessels grossly unremarkable. Cardiomegaly. No pericardial effusion. Pulmonary arterial tree adequately opacified for evaluation. Main pulmonary artery dilated to 3.4 cm in transverse diameter. No filling defect to suggest acute pulmonary embolism. Re-formatted imaging confirms these findings. Mediastinum/Nodes: Visualized thyroid normal. No pathologically enlarged mediastinal, hilar, or axillary lymph nodes identified. Esophagus within normal limits. Lungs/Pleura: Tracheobronchial tree patent. Hazy ground-glass opacity  within the deep tendon aspects of the lung bases favored to reflect mild congestion/ edema. No focal infiltrates. No pleural effusions. No pneumothorax. No worrisome pulmonary nodule or mass. Upper Abdomen: Visualized upper abdomen within normal limits. Musculoskeletal: No acute osseus abnormality. No worrisome lytic or blastic osseous lesions. Review of the MIP images confirms the above findings. IMPRESSION: 1. No CT evidence for acute pulmonary embolism. 2. Cardiomegaly with hazy ground-glass opacity within the dependent aspects of both lower lobes, favored to reflect mild congestion/edema. 3. No other acute cardiopulmonary abnormality identified. Electronically Signed   By: Rise Mu M.D.   On: 08/24/2016 05:35    EKG: Independently reviewed.  Assessment/Plan Active Problems:   Chest pain with moderate risk for cardiac etiology   Essential hypertension   Chest pain, moderate coronary artery risk   Unstable angina pectoris (HCC)   NICM (nonischemic cardiomyopathy) (HCC)   Chest pain    Chest pain syndrome/known NICM, rule out unstable angina similar symptoms to prior admission.  per cardiac cath 10/2014 with patent coronary arteries and possible heart failure, EF 35-35%,non compliant with meds . HEART score 6 . Troponin 0.08, EKG Left anterior fascicular block, LVH ,Nonspecific T abnormalities inferior leads. CP not completely relieved by nitroglycerin, morphine,  aspirin. CTA neg for PE or other acute findings Risk factors include THC abuse, age, fam hx premature heart disease, HTN uncontrolled, possible HLD. Hep drip initiated at the ED.   Admit to Inpatient  Chest pain order set Cycle troponins EKG in am continue ASA, O2 and NTG as needed Resume home meds  GI cocktail Check Lipid panel  Hb A1C 2 D echo Consult to Cards, patient will be kept NPO with sips for meds in case that cardiac catheterization is indicated.    Essential Hypertension BP  139/96   Pulse  57   Temp   Resume home anti-hypertensive medications  Add Hydralazine Q6 hours as needed for BP 160/90   Hypersomnolence CT head neg for acute findings  Patient may need sleep study as OP, possible OSA   DVT prophylaxis:  Heparin  Code Status:   Full     Family Communication:  Discussed with patient Disposition Plan: Expect patient to be discharged to home after condition improves Consults called:    Cardiology  Admission status:Tele Inpatient    [facility] E, PA-C [facility]   08/24/2016, 8:16 AM

## 2016-08-24 NOTE — Consult Note (Signed)
Cardiology Consultation:   Patient ID: Kenneth Gould; [id number]; July 02, 1965   Admit date: 08/23/2016 Date of Consult: 08/24/2016  Primary Care Provider: Patient, No Pcp Per Primary Cardiologist: Dr. Excell Seltzer (never followed up in clinic)   Patient Profile:   Kenneth Gould is a 51 y.o. male with a hx of  untreated HTN, tobacco abuse, NICM, and family history of coronary artery disease who is being seen today for the evaluation of chest tightness and elevated troponin at the request of Dr. Konrad Dolores.   Admitted 10/2014 for chest pressure and tightness. Patient ruled out for myocardial infarction however continue to report upper chest discomfort. He remained on IV heparin and nitroglycerin. Decision was made to pursue diagnostic catheterization that revealed normal coronary arteries with reduced LV function, with an EF of 35-45%. Noted bradycardic on telemetry. Reduced coreg to 3.25mg  BID. Recommended outpatient sleep study for + snoring and apneic episode.   States he stopped smoking tobacco however continues to smoke marijuana. Last use 2 days ago.   History of Present Illness:   Mr. Ressel does not takes any medication at home. He was in US[facility] un until yesterday when he had sudden onset diaphoresis, chest tightness, dyspnea, left sided numbness and dizziness. Called EMS. Minimal improvement after SL nitro x 1 and ASA. Complains of back pain with deep breath.   UDS positive for marijuana. Troponin trend 0.08-->0.07. EKG showed sinus rhythm with LAFB, no acute changes- personally reviewed. CTA of chest showed no PE with mild vascular congestion. Telemetry showed sinus bradycardia in high 40s to 50s. CTA of head without acute findings. Started on IV heparin. BP elevated at times.   Past Medical History:  Diagnosis Date  . Essential hypertension 10/24/2014  . Family history of premature CAD   . NICM (nonischemic cardiomyopathy) (HCC)    a. 10/2014 Cath: nl cors, EF 35-45%, glob HK.  .  Obesity (BMI 30-39.9)     Past Surgical History:  Procedure Laterality Date  . CARDIAC CATHETERIZATION N/A 10/25/2014   Procedure: Left Heart Cath and Coronary Angiography;  Surgeon: Lyn Records, MD;  Location: [facility] INVASIVE CV LAB;  Service: Cardiovascular;  Laterality: N/A;  . None       Inpatient Medications: Scheduled Meds: . aspirin  81 mg Oral Daily  . carvedilol  3.125 mg Oral BID WC  . famotidine  20 mg Oral BID  . lisinopril-hydrochlorothiazide  1 tablet Oral Daily   Continuous Infusions: . sodium chloride 75 mL/hr at 08/24/16 0744  . heparin 1,000 Units/hr (08/24/16 0755)   PRN Meds: acetaminophen, ALPRAZolam, gi cocktail, hydrALAZINE, morphine injection, nitroGLYCERIN, ondansetron (ZOFRAN) IV  Allergies:    Allergies  Allergen Reactions  . Other     Nyquill    Social History:   Social History   Social History  . Marital status: Married    Spouse name: N/A  . Number of children: N/A  . 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 Smoking status: Former Smoker    Packs/day: 0.50    Years: 30.00    Types: Cigarettes    Quit date: 08/25/2015  . Smokeless tobacco: Never Used  . Alcohol use No  . Drug use: Yes    Types: Marijuana     Comment: THC  . Sexual activity: Yes   Other Topics Concern  . Not on file   Social History Narrative  . No narrative on file  Family History:   Family History  Problem Relation Age of Onset  . Heart attack Sister 49       Died in her sleep, dx at autopsy  . Heart attack Cousin 91       Died in his sleep     ROS:  Please see the history of present illness.  ROS  All other ROS reviewed and negative.     Physical Exam/Data:   Vitals:   08/24/16 0700 08/24/16 0800 08/24/16 0830 08/24/16 0915  BP: (!) 139/96 126/76 (!) 147/95 (!) 143/105  Pulse: (!) 57 (!) 59 (!) 55 (!) 52  Resp:    16  Temp:      TempSrc:      SpO2: 98% 99% 96% 97%  Weight:        Height:       No intake or output data in the 24 hours ending 08/24/16 1027 Filed Weights   08/23/16 2010  Weight: 300 lb (136.1 kg)   Body mass index is 45.61 kg/m.  General:  Obese male in no acute distress HEENT: normal Lymph: no adenopathy Neck: no JVD Endocrine:  No thryomegaly Vascular: No carotid bruits; FA pulses 2+ bilaterally without bruits  Cardiac:  normal S1, S2; RRR; no murmur  Lungs:  clear to auscultation bilaterally, no wheezing, rhonchi or rales  Abd: soft, nontender, no hepatomegaly  Ext: no edema Musculoskeletal:  No deformities, BUE and BLE strength normal and equal Skin: warm and dry  Neuro:  CNs 2-12 intact, no focal abnormalities noted Psych:  Normal affect    Relevant CV Studies: Echo 10/2014 Study Conclusions  - Left ventricle: The cavity size was moderately dilated. Wall   thickness was normal. Systolic function was mildly to moderately   reduced. The estimated ejection fraction was in the range of 40%   to 45%. Doppler parameters are consistent with abnormal left   ventricular relaxation (grade 1 diastolic dysfunction). Doppler   parameters are consistent withmildly elevated ventricular filling   pressure. - Aortic valve: There was no regurgitation. - Mitral valve: There was no regurgitation. - Left atrium: The atrium was normal in size. - Right ventricle: Systolic function was normal. - Right atrium: The atrium was mildly dilated. - Tricuspid valve: There was trivial regurgitation. - Pulmonary arteries: Systolic pressure was within the normal   range. - Pericardium, extracardiac: There was no pericardial effusion.  Impressions:  - Moderately dilated LV with mildly to moderately decreased   systolic function, LVEF 40-45%.   There is hypokinesis of the basal and mid anterior and basal   anteroseptal walls.  Cath 10/2014   Widely patent coronary arteries   Globally hypokinetic left ventricle in the 35-45% range. Elevated left  ventricular end-diastolic pressure suggests systolic heart failure of unknown duration.  Etiology is uncertain , with possibilities including essential hypertension and obesity.     RECOMMENDATIONS:    Heart failure therapy   Weight reduction   Evaluate for sleep apnea   Blood pressure control   Will discontinue IV nitroglycerin   Will convert anticoagulation to DVT prophylaxis   Laboratory Data:  Chemistry Recent Labs Lab 08/23/16 2009  NA 138  K 4.0  CL 106  CO2 25  GLUCOSE 122*  BUN 8  CREATININE 0.96  CALCIUM 8.8*  GFRNONAA >60  GFRAA >60  ANIONGAP 7     Recent Labs Lab 08/24/16 0248  PROT 6.8  ALBUMIN 3.4*  AST 17  ALT 11*  ALKPHOS 78  BILITOT 1.2   Hematology Recent Labs Lab 08/23/16 2009  WBC 7.2  RBC 5.37  HGB 15.1  HCT 44.4  MCV 82.7  MCH 28.1  MCHC 34.0  RDW 14.7  PLT 189   Cardiac Enzymes Recent Labs Lab 08/24/16 0248 08/24/16 0730  TROPONINI 0.08* 0.07*    Recent Labs Lab 08/23/16 2031  TROPIPOC 0.04    BNPNo results for input(s): BNP, PROBNP in the last 168 hours.  DDimer No results for input(s): DDIMER in the last 168 hours.  Radiology/Studies:  Dg Chest 2 View  Result Date: 08/23/2016 CLINICAL DATA:  51 y/o M; sudden onset of chest pressure and global weakness. EXAM: CHEST  2 VIEW COMPARISON:  01/22/2015 chest radiograph. FINDINGS: Mild cardiomegaly. Diffuse hazy opacification of lungs probably representing mild pulmonary edema. No focal consolidation. No pleural effusion. No pneumothorax. Bones are unremarkable. IMPRESSION: Cardiomegaly and diffuse hazy opacification of lungs probably representing mild pulmonary edema. Electronically Signed   By: Mitzi Hansen M.D.   On: 08/23/2016 22:37   Ct Head Wo Contrast  Result Date: 08/23/2016 CLINICAL DATA:  Essential hypertension, subacute neurologic deficits. EXAM: CT HEAD WITHOUT CONTRAST TECHNIQUE: Contiguous axial images were obtained from the base of the  skull through the vertex without intravenous contrast. COMPARISON:  01/11/2016 FINDINGS: Brain: No evidence of acute infarction, hemorrhage, hydrocephalus, extra-axial collection or mass lesion/mass effect. Stable dural calcifications. Vascular: No hyperdense vessel or unexpected calcification. Skull: Normal. Negative for fracture or focal lesion. Sinuses/Orbits: Minimal frontal sinus mucosal thickening on the right. No acute sinus disease. Intact orbits and globes. Other: None IMPRESSION: No acute intracranial abnormality.  No significant change. Electronically Signed   By: Tollie Eth M.D.   On: 08/23/2016 21:24   Ct Angio Chest Pe W And/or Wo Contrast  Result Date: 08/24/2016 CLINICAL DATA:  Initial evaluation for acute chest pressure radiating to left arm. EXAM: CT ANGIOGRAPHY CHEST WITH CONTRAST TECHNIQUE: Multidetector CT imaging of the chest was performed using the standard protocol during bolus administration of intravenous contrast. Multiplanar CT image reconstructions and MIPs were obtained to evaluate the vascular anatomy. CONTRAST:  100 cc of Isovue 370. COMPARISON:  Prior radiograph from 08/23/2016. FINDINGS: Cardiovascular: Intrathoracic aorta up of normal caliber without abnormality. Partially visualized great vessels grossly unremarkable. Cardiomegaly. No pericardial effusion. Pulmonary arterial tree adequately opacified for evaluation. Main pulmonary artery dilated to 3.4 cm in transverse diameter. No filling defect to suggest acute pulmonary embolism. Re-formatted imaging confirms these findings. Mediastinum/Nodes: Visualized thyroid normal. No pathologically enlarged mediastinal, hilar, or axillary lymph nodes identified. Esophagus within normal limits. Lungs/Pleura: Tracheobronchial tree patent. Hazy ground-glass opacity within the deep tendon aspects of the lung bases favored to reflect mild congestion/ edema. No focal infiltrates. No pleural effusions. No pneumothorax. No worrisome pulmonary  nodule or mass. Upper Abdomen: Visualized upper abdomen within normal limits. Musculoskeletal: No acute osseus abnormality. No worrisome lytic or blastic osseous lesions. Review of the MIP images confirms the above findings. IMPRESSION: 1. No CT evidence for acute pulmonary embolism. 2. Cardiomegaly with hazy ground-glass opacity within the dependent aspects of both lower lobes, favored to reflect mild congestion/edema. 3. No other acute cardiopulmonary abnormality identified. Electronically Signed   By: Rise Mu M.D.   On: 08/24/2016 05:35    Assessment and Plan:   1. Chest pain with elevated troponin - His chest pain is atypical in setting of elevated blood pressure. Troponin flat trend. EKG without acute findings. CTA negative for PE but showed mild congestion/edema.  - Seems  his symptoms is due to acute mild CHF, obesity and untreated hypertension. Hold coreg given bradycardia. Continue lisinopril/HCTZ--> up titrate as needed. Check BNP and echo.   2. HTN, uncontrolled - He is not taking any medication at home. Elevated here. As above  3. Bradycardia - Stop coreg as baseline rate of 40-50s.   4. Snoring - Consider sleep study as outpatient.   5. NICM - As above.   6. Marijuana abuse - Advised cessation.    Lorelei Pont, PA  08/24/2016 10:27 AM   Patient seen and examined and history reviewed. Agree with above findings and plan. 51 yo BM with history of hypertensive heart disease and hypertensive cardiomyopathy with EF 40% in 2016. Cardiac cath at that time shows normal coronary arteries. Presents now with pain in mid thoracic flank, hurts to take a breath. No other chest pain. Felt like he might pass out.  On exam he is an overweight BM in NAD Lungs clear. CV RRR without gallop or murmur Abd soft NT. No edema. Pulses 2+.  BP is currently elevated but patient reports control has been good.   Troponin slightly elevated with flat trend. Ecg shows LVH  without acute change.  CT angio of chest shows no PE or dissection  Impression:  1. Atypical back/flank pain. Noncardiac 2. HTN with hypertensive heart disease. BP currently elevated. Agree with resumption of lisinopril HCT. Hold beta blocker due to bradycardia. 3. Nonischemic cardiomyopathy. Prior EF 40%. Will update Echo. Does not appear to be significantly volume overloaded at this time. 4. Elevated troponin of unclear significance. Doubt ACS with normal cath 2 years ago.   Kasir Hallenbeck [place], MDFACC 08/24/2016 10:58 AM

## 2016-08-24 NOTE — ED Notes (Signed)
Notified [place], PA of Trop of 0.08

## 2016-08-24 NOTE — ED Notes (Signed)
Troponin I fourth discontinued because it was just drawn and resulted, pt has had 6 troponin's draw already, one order overlapped for blood

## 2016-08-24 NOTE — ED Provider Notes (Signed)
  Patient care signed out to me at shift change from previous provider pending further evaluation and management. Please see previous provider's note for full H&P. Short patient presents with one-day history of left-sided chest pain. He describes this as a sharp pain in his left chest and lateral ribs. He notes the pain is made worse with deep inspiration. He notes radiation of symptoms into his shoulder. Patient reports associated shortness of breath. He denies any abdominal pain, lower extremity swelling or edema, denies any history of DVT or PE or any other significant risk factors. Patient notes that he smokes marijuana, and was smoking PCP 2 days ago.  Patient has a history of hypertension, morbid obesity, tobacco use. He was seen by cardiology in 2016 with chest pain he had a cath at that time that showed coronary arteries were patent.  Patient's CT PE study with no signs of pulmonary embolism, does show cardiomegaly with signs of vascular congestion and edema.  Patient's pain continues persistent despite aspirin and nitroglycerin here in the ED. Due to ongoing discomfort and elevated troponin patient will be admitted to the hospitalist service for ongoing evaluation and management. The patient has had normal calf approximately 2 years ago question drug induced demand versus CHF.  Patient has remained stable while here in the ED.   Vitals:   08/24/16 0545 08/24/16 0600  BP: (!) 132/91 (!) 136/98  Pulse: (!) 59 (!) 57  Resp:    9[address] Plymouth StreetEyvonne Mechanic, PA-C 08/24/16 1478    Shon Baton, MD 08/25/16 [phone]

## 2016-08-24 NOTE — ED Notes (Signed)
Pt given graham crackers and orange juice, per Avie Arenas, RN.

## 2016-08-24 NOTE — Progress Notes (Signed)
ANTICOAGULATION CONSULT NOTE   Pharmacy Consult for Heparin Indication: chest pain/ACS  Allergies  Allergen Reactions  . Other Other (See Comments)    "gets really sick when takes Nyquill"    Patient Measurements: Height: 5\' 8"  (172.7 cm) Weight: 300 lb (136.1 kg) IBW/kg (Calculated) : 68.4 Heparin Dosing Weight: 100 kg  Vital Signs: BP: 140/89 (08/07 1530) Pulse Rate: 53 (08/07 1530)  Labs:  Recent Labs  08/23/16 2009 08/24/16 0248 08/24/16 0730 08/24/16 0954 08/24/16 1627  HGB 15.1  --   --   --   --   HCT 44.4  --   --   --   --   PLT 189  --   --   --   --   HEPARINUNFRC  --   --   --   --  0.10*  CREATININE 0.96  --   --   --   --   TROPONINI  --  0.08* 0.07* 0.08*  --     Estimated Creatinine Clearance: 124.3 mL/min (by C-G formula based on SCr of 0.96 mg/dL).   Medical History: Past Medical History:  Diagnosis Date  . Essential hypertension 10/24/2014  . Family history of premature CAD   . NICM (nonischemic cardiomyopathy) (HCC)    a. 10/2014 Cath: nl cors, EF 35-45%, glob HK.  . Obesity (BMI 30-39.9)     Medications:  No current facility-administered medications on file prior to encounter.    Current Outpatient Prescriptions on File Prior to Encounter  Medication Sig Dispense Refill  . cyclobenzaprine (FLEXERIL) 10 MG tablet Take 1 tablet (10 mg total) by mouth 2 (two) times daily as needed for muscle spasms. (Patient not taking: Reported on 08/24/2016) 20 tablet 0  . ibuprofen (ADVIL,MOTRIN) 800 MG tablet Take 1 tablet (800 mg total) by mouth 3 (three) times daily. (Patient not taking: Reported on 08/24/2016) 21 tablet 0     Assessment: 51 y.o. male with chest pain for heparin   6 hr HL 0.1 after 4000 units bolus and 1000 units/hr  Goal of Therapy:  Heparin level 0.3-0.7 units/ml Monitor platelets by anticoagulation protocol: Yes   Plan:  Heparin 3000 units IV bolus, then start heparin 1400 units/hr Check heparin level in 6 hours.  Ladell Pier, PharmD Pharmacy Resident Pager: [phone] 08/24/2016 5:20 PM

## 2016-08-24 NOTE — ED Notes (Signed)
Patient awake speaking to PA at bedside at this time

## 2016-08-24 NOTE — ED Notes (Signed)
Pt sleeping. 

## 2016-08-24 NOTE — ED Notes (Signed)
Patient transported to CT 

## 2016-08-25 ENCOUNTER — Observation Stay (HOSPITAL_BASED_OUTPATIENT_CLINIC_OR_DEPARTMENT_OTHER): Payer: BLUE CROSS/BLUE SHIELD

## 2016-08-25 DIAGNOSIS — R079 Chest pain, unspecified: Principal | ICD-10-CM

## 2016-08-25 DIAGNOSIS — R0789 Other chest pain: Secondary | ICD-10-CM

## 2016-08-25 LAB — CBC
HCT: 47.3 % (ref 39.0–52.0)
Hemoglobin: 15.4 g/dL (ref 13.0–17.0)
MCH: 27.4 pg (ref 26.0–34.0)
MCHC: 32.6 g/dL (ref 30.0–36.0)
MCV: 84 fL (ref 78.0–100.0)
PLATELETS: 168 10*3/uL (ref 150–400)
RBC: 5.63 MIL/uL (ref 4.22–5.81)
RDW: 14.7 % (ref 11.5–15.5)
WBC: 7.6 10*3/uL (ref 4.0–10.5)

## 2016-08-25 LAB — HEPARIN LEVEL (UNFRACTIONATED)
HEPARIN UNFRACTIONATED: 0.32 [IU]/mL (ref 0.30–0.70)
Heparin Unfractionated: 0.38 IU/mL (ref 0.30–0.70)

## 2016-08-25 LAB — ECHOCARDIOGRAM COMPLETE
CHL CUP DOP CALC LVOT VTI: 12.9 cm
CHL CUP MV DEC (S): 264
E decel time: 264 msec
E/e' ratio: 6.03
FS: 17 % — AB (ref 28–44)
Height: 67 in
IVS/LV PW RATIO, ED: 0.89
LA ID, A-P, ES: 41 mm
LA diam end sys: 41 mm
LA vol A4C: 73.2 ml
LA vol: 82.4 mL
LADIAMINDEX: 1.74 cm/m2
LAVOLIN: 35.1 mL/m2
LV e' LATERAL: 10.7 cm/s
LVEEAVG: 6.03
LVEEMED: 6.03
LVOT area: 4.91 cm2
LVOTD: 25 mm
LVOTPV: 81.3 cm/s
LVOTSV: 63 mL
MV pk A vel: 59.2 m/s
MV pk E vel: 64.5 m/s
PW: 14.7 mm — AB (ref 0.6–1.1)
RV LATERAL S' VELOCITY: 15.6 cm/s
RV TAPSE: 31 mm
TDI e' lateral: 10.7
TDI e' medial: 6.31
Weight: 4572.8 oz

## 2016-08-25 LAB — D-DIMER, QUANTITATIVE: D-Dimer, Quant: <0.27 ug/mL-FEU (ref 0.00–0.50)

## 2016-08-25 LAB — HEMOGLOBIN A1C
HEMOGLOBIN A1C: 6 % — AB (ref 4.8–5.6)
MEAN PLASMA GLUCOSE: 126 mg/dL

## 2016-08-25 MED ORDER — PNEUMOCOCCAL VAC POLYVALENT 25 MCG/0.5ML IJ INJ
0.5000 mL | INJECTION | INTRAMUSCULAR | Status: AC
  Administered 2016-08-26: 0.5 mL via INTRAMUSCULAR
  Filled 2016-08-25: qty 0.5

## 2016-08-25 MED ORDER — DIPHENHYDRAMINE HCL 25 MG PO CAPS
25.0000 mg | ORAL_CAPSULE | Freq: Once | ORAL | Status: AC
  Administered 2016-08-25: 25 mg via ORAL
  Filled 2016-08-25: qty 1

## 2016-08-25 NOTE — Plan of Care (Signed)
Problem: Safety: Goal: Ability to remain free from injury will improve Outcome: Progressing Fall risk bundle in place per protocol. No injuries, falls or skin break down this shift.

## 2016-08-25 NOTE — Progress Notes (Signed)
Please see written progress note placed in chart 8/8 during epic downtime.  Patient c/o intermittent swallowing issues- no pattern, can happen with liquids and solids.  DG esophagram ordered for AM.  Patient made NPO. Drug cessation counseling given .  Marlin Canary DO

## 2016-08-25 NOTE — Progress Notes (Signed)
*  PRELIMINARY RESULTS* Echocardiogram 2D Echocardiogram has been performed.  Delcie Roch 08/25/2016, 5:38 PM

## 2016-08-25 NOTE — Progress Notes (Signed)
  ANTICOAGULATION CONSULT NOT Pharmacy Consult for Heparin Indication: chest pain/ACS  Allergies  Allergen Reactions  . Other Other (See Comments)    "gets really sick when takes Nyquill"    Patient Measurements: Height: 5\' 7"  (170.2 cm) Weight: 284 lb 9.8 oz (129.1 kg) IBW/kg (Calculated) : 66.1 Heparin Dosing Weight: 100 kg  Vital Signs: Temp: 98.2 F (36.8 C) (08/07 2039) Temp Source: Oral (08/07 2039) BP: 168/94 (08/07 2039) Pulse Rate: 70 (08/07 2039)  Labs:  Recent Labs  08/23/16 2009  08/24/16 0730 08/24/16 0954 08/24/16 1620 08/24/16 1627 08/24/16 2338  HGB 15.1  --   --   --   --   --   --   HCT 44.4  --   --   --   --   --   --   PLT 189  --   --   --   --   --   --   HEPARINUNFRC  --   --   --   --   --  0.10* 0.32  CREATININE 0.96  --   --   --   --   --   --   TROPONINI  --   < > 0.07* 0.08* 0.07*  --   --   < > = values in this interval not displayed.  Estimated Creatinine Clearance: 118.9 mL/min (by C-G formula based on SCr of 0.96 mg/dL).   Assessment: 51 y.o. male with chest pain for heparin  Goal of Therapy:  Heparin level 0.3-0.7 units/ml Monitor platelets by anticoagulation protocol: Yes   Plan:  Continue Heparin at current rate for now  Ota Ebersole, [employer] 08/25/2016,12:38 AM

## 2016-08-25 NOTE — Progress Notes (Signed)
Pt complained that he has bug bites on his arms.No bugs visible but pt does have small circular dime sized welts on his right and left arm (approx 8 of them). Pt states it may have been a mosquito. Bed linens changed and benadryl given.  No complaints of cp. VS stable. Will continue to monitor.

## 2016-08-26 ENCOUNTER — Observation Stay (HOSPITAL_COMMUNITY): Payer: BLUE CROSS/BLUE SHIELD

## 2016-08-26 DIAGNOSIS — I10 Essential (primary) hypertension: Secondary | ICD-10-CM

## 2016-08-26 DIAGNOSIS — R1310 Dysphagia, unspecified: Secondary | ICD-10-CM

## 2016-08-26 DIAGNOSIS — I428 Other cardiomyopathies: Secondary | ICD-10-CM

## 2016-08-26 DIAGNOSIS — R0789 Other chest pain: Secondary | ICD-10-CM

## 2016-08-26 LAB — CBC
HEMATOCRIT: 47.2 % (ref 39.0–52.0)
HEMOGLOBIN: 15.6 g/dL (ref 13.0–17.0)
MCH: 27.8 pg (ref 26.0–34.0)
MCHC: 33.1 g/dL (ref 30.0–36.0)
MCV: 84 fL (ref 78.0–100.0)
Platelets: 176 10*3/uL (ref 150–400)
RBC: 5.62 MIL/uL (ref 4.22–5.81)
RDW: 14.8 % (ref 11.5–15.5)
WBC: 6.2 10*3/uL (ref 4.0–10.5)

## 2016-08-26 MED ORDER — FAMOTIDINE 20 MG PO TABS: 20.0000 mg | ORAL_TABLET | Freq: Two times a day (BID) | ORAL | 0 refills | Status: DC

## 2016-08-26 MED ORDER — HYDROCHLOROTHIAZIDE 12.5 MG PO CAPS: 12.5000 mg | ORAL_CAPSULE | Freq: Every day | ORAL | 0 refills | Status: DC

## 2016-08-26 MED ORDER — LISINOPRIL 20 MG PO TABS: 20.0000 mg | ORAL_TABLET | Freq: Every day | ORAL | 0 refills | Status: DC

## 2016-08-26 MED ORDER — PERFLUTREN LIPID MICROSPHERE
1.0000 mL | INTRAVENOUS | Status: AC | PRN
  Administered 2016-08-25: 2 mL via INTRAVENOUS
  Filled 2016-08-26: qty 10

## 2016-08-26 MED ORDER — CALAMINE EX LOTN
TOPICAL_LOTION | CUTANEOUS | Status: DC | PRN
  Filled 2016-08-26: qty 177

## 2016-08-26 NOTE — Discharge Summary (Addendum)
Physician Discharge Summary  Kenneth Gould ZOX:[id number] DOB: 24-Feb-1965 DOA: 08/23/2016  PCP: Patient, No Pcp Per  Admit date: 08/23/2016 Discharge date: 08/26/2016   Recommendations for Outpatient Follow-Up:   GI referral if esophageal problems persist PPI  OSA   Discharge Diagnosis:   Active Problems:   Chest pain with moderate risk for cardiac etiology   Essential hypertension   Chest pain, moderate coronary artery risk   Unstable angina pectoris (HCC)   NICM (nonischemic cardiomyopathy) (HCC)   Chest pain   Atypical chest pain   Discharge disposition:  Home.  Discharge Condition: Improved.  Diet recommendation: Low sodium, heart healthy  Wound care: None.   History of Present Illness:   Kenneth Gould is a 51 y.o. male who presents with atypical CP. Suspicious for cardiac etiology. May be from diaphragm irritation from pulmonary congestion or early infection. Appreciate the assistance of Cardiology in the [facility] of this pt.   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 -dietary changes given  Drug abuse -marijuana abuse -PCP abuse  NICM - As above. EF a little lower compared to old studies. Needs to be compliant with meds and outpatient follow up. Needs to avoid illicit drug abuse.  Medical Consultants:    cards   Discharge Exam:   Vitals:   08/25/16 2203 08/26/16 0449  BP: 135/86 (!) 152/85  Pulse:  (!) 57  Resp:  16  Temp:  98.2 F (36.8 C)  SpO2:  100%   Vitals:   08/25/16 1433 08/25/16 2201 08/25/16 2203 08/26/16 0449  BP: 139/80 (!) 150/109 135/86 (!) 152/85  Pulse: 71 61  (!) 57  Resp: 17 17  16   Temp: 98.2  F (36.8 C)   98.2 F (36.8 C)  TempSrc: Oral Oral  Oral  SpO2: 97% 98%  100%  Weight:    128.6 kg (283 lb 9.6 oz)  Height:        Gen:  NAD   The results of significant diagnostics from this hospitalization (including imaging, microbiology, ancillary and laboratory) are listed below for reference.     Procedures and Diagnostic Studies:   Dg Chest 2 View  Result Date: 08/23/2016 CLINICAL DATA:  51 y/o M; sudden onset of chest pressure and global weakness. EXAM: CHEST  2 VIEW COMPARISON:  01/22/2015 chest radiograph. FINDINGS: Mild cardiomegaly. Diffuse hazy opacification of lungs probably representing mild pulmonary edema. No focal consolidation. No pleural effusion. No pneumothorax. Bones are unremarkable. IMPRESSION: Cardiomegaly and diffuse hazy opacification of lungs probably representing mild pulmonary edema. Electronically Signed   By: Mitzi Hansen M.D.   On: 08/23/2016 22:37   Ct Head Wo Contrast  Result Date: 08/23/2016 CLINICAL DATA:  Essential hypertension, subacute neurologic deficits. EXAM: CT HEAD WITHOUT CONTRAST TECHNIQUE: Contiguous axial images were obtained from the base of the skull through the vertex without intravenous contrast. COMPARISON:  01/11/2016 FINDINGS: Brain: No evidence of acute infarction, hemorrhage, hydrocephalus, extra-axial collection or mass lesion/mass effect. Stable dural calcifications. Vascular: No hyperdense vessel or unexpected calcification. Skull: Normal. Negative for fracture or focal lesion. Sinuses/Orbits: Minimal frontal sinus mucosal thickening on the right. No acute sinus disease. Intact orbits and globes. Other: None IMPRESSION: No acute intracranial abnormality.  No significant change. Electronically Signed  By: Tollie Eth M.D.   On: 08/23/2016 21:24   Ct Angio Chest Pe W And/or Wo Contrast  Result Date: 08/24/2016 CLINICAL DATA:  Initial evaluation for acute chest pressure radiating to left arm. EXAM: CT ANGIOGRAPHY CHEST  WITH CONTRAST TECHNIQUE: Multidetector CT imaging of the chest was performed using the standard protocol during bolus administration of intravenous contrast. Multiplanar CT image reconstructions and MIPs were obtained to evaluate the vascular anatomy. CONTRAST:  100 cc of Isovue 370. COMPARISON:  Prior radiograph from 08/23/2016. FINDINGS: Cardiovascular: Intrathoracic aorta up of normal caliber without abnormality. Partially visualized great vessels grossly unremarkable. Cardiomegaly. No pericardial effusion. Pulmonary arterial tree adequately opacified for evaluation. Main pulmonary artery dilated to 3.4 cm in transverse diameter. No filling defect to suggest acute pulmonary embolism. Re-formatted imaging confirms these findings. Mediastinum/Nodes: Visualized thyroid normal. No pathologically enlarged mediastinal, hilar, or axillary lymph nodes identified. Esophagus within normal limits. Lungs/Pleura: Tracheobronchial tree patent. Hazy ground-glass opacity within the deep tendon aspects of the lung bases favored to reflect mild congestion/ edema. No focal infiltrates. No pleural effusions. No pneumothorax. No worrisome pulmonary nodule or mass. Upper Abdomen: Visualized upper abdomen within normal limits. Musculoskeletal: No acute osseus abnormality. No worrisome lytic or blastic osseous lesions. Review of the MIP images confirms the above findings. IMPRESSION: 1. No CT evidence for acute pulmonary embolism. 2. Cardiomegaly with hazy ground-glass opacity within the dependent aspects of both lower lobes, favored to reflect mild congestion/edema. 3. No other acute cardiopulmonary abnormality identified. Electronically Signed   By: Rise Mu M.D.   On: 08/24/2016 05:35     Labs:   Basic Metabolic Panel:  Recent Labs Lab 08/23/16 2009  NA 138  K 4.0  CL 106  CO2 25  GLUCOSE 122*  BUN 8  CREATININE 0.96  CALCIUM 8.8*   GFR Estimated Creatinine Clearance: 118.6 mL/min (by C-G formula  based on SCr of 0.96 mg/dL). Liver Function Tests:  Recent Labs Lab 08/24/16 0248  AST 17  ALT 11*  ALKPHOS 78  BILITOT 1.2  PROT 6.8  ALBUMIN 3.4*   No results for input(s): LIPASE, AMYLASE in the last 168 hours.  Recent Labs Lab 08/23/16 2208  AMMONIA 40*   Coagulation profile No results for input(s): INR, PROTIME in the last 168 hours.  CBC:  Recent Labs Lab 08/23/16 2009 08/25/16 0243 08/26/16 0326  WBC 7.2 7.6 6.2  HGB 15.1 15.4 15.6  HCT 44.4 47.3 47.2  MCV 82.7 84.0 84.0  PLT 189 168 176   Cardiac Enzymes:  Recent Labs Lab 08/24/16 0248 08/24/16 0730 08/24/16 0954 08/24/16 1620  TROPONINI 0.08* 0.07* 0.08* 0.07*   BNP: Invalid input(s): POCBNP CBG: No results for input(s): GLUCAP in the last 168 hours. D-Dimer  Recent Labs  08/25/16 1115  DDIMER <0.27   Hgb A1c  Recent Labs  08/24/16 0954  HGBA1C 6.0*   Lipid Profile  Recent Labs  08/24/16 0954  CHOL 168  HDL 50  LDLCALC 106*  TRIG 62  CHOLHDL 3.4   Thyroid function studies No results for input(s): TSH, T4TOTAL, T3FREE, THYROIDAB in the last 72 hours.  Invalid input(s): FREET3 Anemia work up No results for input(s): VITAMINB12, FOLATE, FERRITIN, TIBC, IRON, RETICCTPCT in the last 72 hours. Microbiology No results found for this or any previous visit (from the past 240 hour(s)).   Discharge Instructions:   Discharge Instructions    Diet - low sodium heart healthy    Complete by:  As directed  Discharge instructions    Complete by:  As directed    Close follow up with PCP for BP medication titration GI referral if needed and sleep study Eat 6 small meals per day and do not lay flat for 3 hours after each meal Drug cessation   Increase activity slowly    Complete by:  As directed      Allergies as of 08/26/2016      Reactions   Other Other (See Comments)   "gets really sick when takes Nyquill"      Medication List    STOP taking these medications     cyclobenzaprine 10 MG tablet Commonly known as:  FLEXERIL   ibuprofen 800 MG tablet Commonly known as:  ADVIL,MOTRIN     TAKE these medications   famotidine 20 MG tablet Commonly known as:  PEPCID Take 1 tablet (20 mg total) by mouth 2 (two) times daily.   hydrochlorothiazide 12.5 MG capsule Commonly known as:  MICROZIDE Take 1 capsule (12.5 mg total) by mouth daily.   lisinopril 20 MG tablet Commonly known as:  PRINIVIL,ZESTRIL Take 1 tablet (20 mg total) by mouth daily.      Follow-up Information    PCP in [place] Follow up.            Time coordinating discharge: 35 min  Signed:  JESSICA U VANN   [facility] 08/26/2016, 11:19 AM

## 2016-08-26 NOTE — Progress Notes (Signed)
Progress Note  Patient Name: Kenneth Gould Date of Encounter: 08/26/2016  Primary Cardiologist: Dr. Excell Seltzer  Subjective   Denies SOB or chest pain  Inpatient Medications    Scheduled Meds: . aspirin  81 mg Oral Daily  . famotidine  20 mg Oral BID  . lisinopril  20 mg Oral Daily   And  . hydrochlorothiazide  12.5 mg Oral Daily  . pneumococcal 23 valent vaccine  0.5 mL Intramuscular Tomorrow-1000   Continuous Infusions:  PRN Meds: acetaminophen, ALPRAZolam, calamine, gi cocktail, hydrALAZINE, ketorolac, methocarbamol, morphine injection, nitroGLYCERIN, ondansetron (ZOFRAN) IV   Vital Signs    Vitals:   08/25/16 1433 08/25/16 2201 08/25/16 2203 08/26/16 0449  BP: 139/80 (!) 150/109 135/86 (!) 152/85  Pulse: 71 61  (!) 57  Resp: 17 17  16   Temp: 98.2 F (36.8 C)   98.2 F (36.8 C)  TempSrc: Oral Oral  Oral  SpO2: 97% 98%  100%  Weight:    283 lb 9.6 oz (128.6 kg)  Height:        Intake/Output Summary (Last 24 hours) at 08/26/16 0828 Last data filed at 08/25/16 2203  Gross per 24 hour  Intake              240 ml  Output             1000 ml  Net             -760 ml   Filed Weights   08/24/16 1842 08/25/16 0509 08/26/16 0449  Weight: 284 lb 9.8 oz (129.1 kg) 285 lb 12.8 oz (129.6 kg) 283 lb 9.6 oz (128.6 kg)    Telemetry    NSR - Personally Reviewed  ECG   none today  Physical Exam   GEN: No acute distress.  Obese Neck: No JVD Cardiac: RRR, no murmurs, rubs, or gallops.  Respiratory: Clear to auscultation bilaterally. GI: Soft, nontender, non-distended  MS: No edema; No deformity. Neuro:  Nonfocal  Psych: Normal affect   Labs    Chemistry Recent Labs Lab 08/23/16 2009 08/24/16 0248  NA 138  --   K 4.0  --   CL 106  --   CO2 25  --   GLUCOSE 122*  --   BUN 8  --   CREATININE 0.96  --   CALCIUM 8.8*  --   PROT  --  6.8  ALBUMIN  --  3.4*  AST  --  17  ALT  --  11*  ALKPHOS  --  78  BILITOT  --  1.2  GFRNONAA >60  --   GFRAA >60   --   ANIONGAP 7  --      Hematology Recent Labs Lab 08/23/16 2009 08/25/16 0243 08/26/16 0326  WBC 7.2 7.6 6.2  RBC 5.37 5.63 5.62  HGB 15.1 15.4 15.6  HCT 44.4 47.3 47.2  MCV 82.7 84.0 84.0  MCH 28.1 27.4 27.8  MCHC 34.0 32.6 33.1  RDW 14.7 14.7 14.8  PLT 189 168 176    Cardiac Enzymes Recent Labs Lab 08/24/16 0248 08/24/16 0730 08/24/16 0954 08/24/16 1620  TROPONINI 0.08* 0.07* 0.08* 0.07*    Recent Labs Lab 08/23/16 2031  TROPIPOC 0.04     BNP Recent Labs Lab 08/24/16 1620  BNP 51.8     DDimer  Recent Labs Lab 08/25/16 1115  DDIMER <0.27     Radiology    No results found.  Cardiac Studies   Echo: Study Conclusions  -  Left ventricle: The cavity size was severely dilated. There was   moderate concentric hypertrophy. Systolic function was moderately   to severely reduced. The estimated ejection fraction was in the   range of 30% to 35%. Severe diffuse hypokinesis with distinct   regional wall motion abnormalities. There is akinesis of the   basal-midinferolateral myocardium. Features are consistent with a   pseudonormal left ventricular filling pattern, with concomitant   abnormal relaxation and increased filling pressure (grade 2   diastolic dysfunction). - Mitral valve: Calcified annulus. Mild diffuse calcification of   the anterior leaflet. - Left atrium: The atrium was mildly dilated. Anterior-posterior   dimension: 41 mm. - Right ventricle: Poorly visualized. The cavity size was   moderately dilated. Wall thickness was normal. - Right atrium: The atrium was moderately dilated. - Pulmonic valve: There was no significant regurgitation. - Pulmonary arteries: Systolic pressure could not be accurately   estimated.  Patient Profile     51 y.o. male with a hx of  untreated HTN, tobacco abuse, NICM, and family history of coronary artery disease who is  seen  for the evaluation of chest tightness and elevated troponin   Assessment & Plan      1. Chest pain with elevated troponin - His chest pain is atypical in setting of elevated blood pressure and recent drug abuse including PCP. Troponin flat trend. EKG without acute findings. CTA negative for PE but showed mild congestion/edema.  - Seems his symptoms is due to demand ischemia due to acute mild CHF, obesity and untreated hypertension. Hold coreg given bradycardia and concern over drug use. Continue lisinopril/HCTZ--> up titrate as needed.   2. HTN, uncontrolled - He is not taking any medication at home. Improved control on meds. Stress compliance with medical therapy.  3. Bradycardia - Stop coreg as baseline rate of 40-50s.   4. Snoring - Consider sleep study as outpatient.   5. NICM - As above. EF a little lower compared to old studies. Needs to be compliant with meds and outpatient follow up. Needs to avoid illicit drug abuse.  6. Marijuana abuse - Advised cessation.   No further recommendations at this time. Will sign off.  Signed, Peter [place], MD  08/26/2016, 8:28 AM

## 2016-08-26 NOTE — Care Management Note (Signed)
Case Management Note  Patient Details  Name: Kenneth Gould MRN: [id number] Date of Birth: 08-Jan-1966  Subjective/Objective:  From home , presents with chest pain, His chest pain is atypical in setting of elevated blood pressure and recent drug abuse , he is for dc today, no needs.                  Action/Plan:   Expected Discharge Date:  08/26/16               Expected Discharge Plan:     In-House Referral:     Discharge planning Services     Post Acute Care Choice:    Choice offered to:     DME Arranged:    DME Agency:     HH Arranged:    HH Agency:     Status of Service:     If discussed at [employer], dates discussed:    Additional Comments:  Leone Haven, RN 08/26/2016, 11:36 AM

## 2016-08-26 NOTE — Plan of Care (Signed)
Problem: Physical Regulation: Goal: Ability to maintain clinical measurements within normal limits will improve Outcome: Progressing Pt g=has ambulated in the hallway tonight.

## 2018-10-04 ENCOUNTER — Emergency Department (HOSPITAL_COMMUNITY): Payer: Self-pay

## 2018-10-04 ENCOUNTER — Emergency Department (HOSPITAL_COMMUNITY)
Admission: EM | Admit: 2018-10-04 | Discharge: 2018-10-05 | Disposition: A | Discharge status: Home / Self Care | Payer: Self-pay | Attending: Emergency Medicine | Admitting: Emergency Medicine

## 2018-10-04 ENCOUNTER — Other Ambulatory Visit: Payer: Self-pay

## 2018-10-04 DIAGNOSIS — R2243 Localized swelling, mass and lump, lower limb, bilateral: Secondary | ICD-10-CM | POA: Insufficient documentation

## 2018-10-04 DIAGNOSIS — I429 Cardiomyopathy, unspecified: Secondary | ICD-10-CM | POA: Insufficient documentation

## 2018-10-04 DIAGNOSIS — Y999 Unspecified external cause status: Secondary | ICD-10-CM | POA: Insufficient documentation

## 2018-10-04 DIAGNOSIS — I110 Hypertensive heart disease with heart failure: Secondary | ICD-10-CM | POA: Insufficient documentation

## 2018-10-04 DIAGNOSIS — Y9389 Activity, other specified: Secondary | ICD-10-CM | POA: Insufficient documentation

## 2018-10-04 DIAGNOSIS — W109XXA Fall (on) (from) unspecified stairs and steps, initial encounter: Secondary | ICD-10-CM | POA: Insufficient documentation

## 2018-10-04 DIAGNOSIS — Y929 Unspecified place or not applicable: Secondary | ICD-10-CM | POA: Insufficient documentation

## 2018-10-04 DIAGNOSIS — I10 Essential (primary) hypertension: Secondary | ICD-10-CM

## 2018-10-04 DIAGNOSIS — S20211A Contusion of right front wall of thorax, initial encounter: Secondary | ICD-10-CM | POA: Insufficient documentation

## 2018-10-04 DIAGNOSIS — Z87891 Personal history of nicotine dependence: Secondary | ICD-10-CM | POA: Insufficient documentation

## 2018-10-04 DIAGNOSIS — I5022 Chronic systolic (congestive) heart failure: Secondary | ICD-10-CM | POA: Insufficient documentation

## 2018-10-04 LAB — BASIC METABOLIC PANEL
Anion gap: 7 (ref 5–15)
BUN: 8 mg/dL (ref 6–20)
CO2: 25 mmol/L (ref 22–32)
Calcium: 9 mg/dL (ref 8.9–10.3)
Chloride: 106 mmol/L (ref 98–111)
Creatinine, Ser: 1.02 mg/dL (ref 0.61–1.24)
GFR calc Af Amer: >60 mL/min (ref >60)
GFR calc non Af Amer: >60 mL/min (ref >60)
Glucose, Bld: 98 mg/dL (ref 70–99)
Potassium: 3.9 mmol/L (ref 3.5–5.1)
Sodium: 138 mmol/L (ref 135–145)

## 2018-10-04 LAB — CBC
HCT: 54.1 % — ABNORMAL HIGH (ref 39.0–52.0)
Hemoglobin: 18.3 g/dL — ABNORMAL HIGH (ref 13.0–17.0)
MCH: 29.2 pg (ref 26.0–34.0)
MCHC: 33.8 g/dL (ref 30.0–36.0)
MCV: 86.3 fL (ref 80.0–100.0)
Platelets: 210 10*3/uL (ref 150–400)
RBC: 6.27 MIL/uL — ABNORMAL HIGH (ref 4.22–5.81)
RDW: 15.5 % (ref 11.5–15.5)
WBC: 7 10*3/uL (ref 4.0–10.5)
nRBC: 0 % (ref 0.0–0.2)

## 2018-10-04 LAB — TROPONIN I (HIGH SENSITIVITY)
Troponin I (High Sensitivity): 6 ng/L (ref <18)
Troponin I (High Sensitivity): 5 ng/L (ref <18)

## 2018-10-04 MED ORDER — SODIUM CHLORIDE 0.9% FLUSH: 3.0000 mL | Freq: Once | INTRAVENOUS | Status: DC

## 2018-10-04 NOTE — ED Triage Notes (Signed)
Pt presents with right side chest pain due to a fall from a porch Saturday. C/o difficulty breathing.

## 2018-10-04 NOTE — ED Notes (Signed)
Pt currently sound asleep in wheelchair in triage room

## 2018-10-05 LAB — BRAIN NATRIURETIC PEPTIDE: B Natriuretic Peptide: 68.5 pg/mL (ref 0.0–100.0)

## 2018-10-05 MED ORDER — HYDROMORPHONE HCL 1 MG/ML IJ SOLN
1.0000 mg | Freq: Once | INTRAMUSCULAR | Status: AC
  Administered 2018-10-05: 1 mg via INTRAVENOUS
  Filled 2018-10-05: qty 1

## 2018-10-05 MED ORDER — FUROSEMIDE 20 MG PO TABS: 20.0000 mg | ORAL_TABLET | Freq: Every day | ORAL | 0 refills | Status: DC

## 2018-10-05 MED ORDER — TRAMADOL HCL 50 MG PO TABS: 50.0000 mg | ORAL_TABLET | Freq: Four times a day (QID) | ORAL | 0 refills | Status: DC | PRN

## 2018-10-05 MED ORDER — LISINOPRIL 20 MG PO TABS: 20.0000 mg | ORAL_TABLET | Freq: Every day | ORAL | 3 refills | Status: DC

## 2018-10-05 MED ORDER — ONDANSETRON HCL 4 MG/2ML IJ SOLN
4.0000 mg | Freq: Once | INTRAMUSCULAR | Status: AC
  Administered 2018-10-05: 05:00:00 4 mg via INTRAVENOUS
  Filled 2018-10-05: qty 2

## 2018-10-05 MED ORDER — POTASSIUM CHLORIDE CRYS ER 10 MEQ PO TBCR: 20.0000 meq | EXTENDED_RELEASE_TABLET | Freq: Every day | ORAL | 0 refills | Status: DC

## 2018-10-05 NOTE — ED Notes (Signed)
Discharge instructions discussed with pt. Pt verbalized understanding with no questions at this time.  

## 2018-10-05 NOTE — ED Provider Notes (Signed)
[facility] Provider Note   CSN: [id number] Arrival date & time: 10/04/18  1924     History   Chief Complaint Chief Complaint  Patient presents with  . Fall    HPI Kenneth Gould is a 53 y.o. male.     Patient presents to the emergency department for evaluation of right-sided chest pain.  Patient reports that he fell 4 days ago.  He was helping someone go up steps that was in a wheelchair and he slipped and fell onto his right side, landing on the concrete steps that he was going up.  He has been having pain in the right ribs ever since the fall.  He reports that he has not been able to sleep because of the pain which has progressively worsened and now he is feeling very short of breath.     Past Medical History:  Diagnosis Date  . Essential hypertension 10/24/2014  . Family history of premature CAD   . NICM (nonischemic cardiomyopathy) (HCC)    a. 10/2014 Cath: nl cors, EF 35-45%, glob HK.  . Obesity (BMI 30-39.9)     Patient Active Problem List   Diagnosis Date Noted  . Atypical chest pain 08/24/2016  . Chronic systolic CHF (congestive heart failure) (HCC)   . Chest pain   . Unstable angina pectoris (HCC) 10/25/2014  . NICM (nonischemic cardiomyopathy) (HCC) 10/25/2014  . Chest pain with moderate risk for cardiac etiology 10/24/2014  . Essential hypertension 10/24/2014  . Chest pain, moderate coronary artery risk 10/24/2014    Past Surgical History:  Procedure Laterality Date  . CARDIAC CATHETERIZATION N/A 10/25/2014   Procedure: Left Heart Cath and Coronary Angiography;  Surgeon: Lyn RecordsHenry W Smith, MD;  [facility];  Service: Cardiovascular;  Laterality: N/A;  . None          Home Medications    Prior to Admission medications   Medication Sig Start Date End Date Taking? Authorizing Provider  furosemide (LASIX) 20 MG tablet Take 1 tablet (20 mg total) by mouth daily. 10/05/18   Gilda CreasePollina, Dominyck Reser J, MD   lisinopril (ZESTRIL) 20 MG tablet Take 1 tablet (20 mg total) by mouth daily. 10/05/18   Naylee Frankowski, Canary Brimhristopher J, MD  potassium chloride SA (K-DUR) 10 MEQ tablet Take 2 tablets (20 mEq total) by mouth daily. 10/05/18   Gilda CreasePollina, Edwardine Deschepper J, MD  traMADol (ULTRAM) 50 MG tablet Take 1 tablet (50 mg total) by mouth every 6 (six) hours as needed. 10/05/18   Gilda CreasePollina, Elissa Grieshop J, MD  famotidine (PEPCID) 20 MG tablet Take 1 tablet (20 mg total) by mouth 2 (two) times daily. Patient not taking: Reported on 10/05/2018 08/26/16 10/05/18  Joseph ArtVann, Jessica U, DO  hydrochlorothiazide (MICROZIDE) 12.5 MG capsule Take 1 capsule (12.5 mg total) by mouth daily. Patient not taking: Reported on 10/05/2018 08/27/16 10/05/18  Joseph ArtVann, Jessica U, DO    Family History Family History  Problem Relation Age of Onset  . Heart attack Sister 2248       Died in her sleep, dx at autopsy  . Heart attack Cousin 2648       Died in his sleep    Social History Social History   Tobacco Use  . Smoking status: Former Smoker    Packs/day: 0.50    Years: 30.00    Pack years: 15.00    Types: Cigarettes    Quit date: 08/25/2015    Years since quitting: 3.1  . Smokeless tobacco: Never  Used  Substance Use Topics  . Alcohol use: No  . Drug use: Yes    Types: Marijuana    Comment: THC     Allergies   Other   Review of Systems Review of Systems  Respiratory: Positive for shortness of breath.   Cardiovascular: Positive for chest pain.  All other systems reviewed and are negative.    Physical Exam Updated Vital Signs BP (!) 164/116   Pulse 68   Temp 97.9 F (36.6 C) (Oral)   Resp 17   Ht 5\' 9"  (1.753 m)   Wt (!) 138.3 kg   SpO2 96%   BMI 45.04 kg/m   Physical Exam Vitals signs and nursing note reviewed.  Constitutional:      General: He is not in acute distress.    Appearance: Normal appearance. He is well-developed.  HENT:     Head: Normocephalic and atraumatic.     Right Ear: Hearing normal.     Left Ear:  Hearing normal.     Nose: Nose normal.  Eyes:     Conjunctiva/sclera: Conjunctivae normal.     Pupils: Pupils are equal, round, and reactive to light.  Neck:     Musculoskeletal: Normal range of motion and neck supple.  Cardiovascular:     Rate and Rhythm: Regular rhythm.     Heart sounds: S1 normal and S2 normal. No murmur. No friction rub. No gallop.   Pulmonary:     Effort: Pulmonary effort is normal. No respiratory distress.     Breath sounds: Normal breath sounds.  Chest:     Chest wall: Tenderness present. No crepitus.    Abdominal:     General: Bowel sounds are normal.     Palpations: Abdomen is soft.     Tenderness: There is no abdominal tenderness. There is no guarding or rebound. Negative signs include Murphy's sign and McBurney's sign.     Hernia: No hernia is present.  Musculoskeletal: Normal range of motion.     Right lower leg: 1+ Edema present.     Left lower leg: 1+ Edema present.  Skin:    General: Skin is warm and dry.     Findings: No rash.  Neurological:     Mental Status: He is alert and oriented to person, place, and time.     GCS: GCS eye subscore is 4. GCS verbal subscore is 5. GCS motor subscore is 6.     Cranial Nerves: No cranial nerve deficit.     Sensory: No sensory deficit.     Coordination: Coordination normal.  Psychiatric:        Speech: Speech normal.        Behavior: Behavior normal.        Thought Content: Thought content normal.      ED Treatments / Results  Labs (all labs ordered are listed, but only abnormal results are displayed) Labs Reviewed  CBC - Abnormal; Notable for the following components:      Result Value   RBC 6.27 (*)    Hemoglobin 18.3 (*)    HCT 54.1 (*)    All other components within normal limits  BASIC METABOLIC PANEL  BRAIN NATRIURETIC PEPTIDE  TROPONIN I (HIGH SENSITIVITY)  TROPONIN I (HIGH SENSITIVITY)    EKG EKG Interpretation  Date/Time:  Wednesday October 04 2018 19:35:27 EDT Ventricular  Rate:  76 PR Interval:  162 QRS Duration: 98 QT Interval:  412 QTC Calculation: 463 R Axis:   -67 Text Interpretation:  Normal sinus rhythm Left anterior fascicular block Minimal voltage criteria for LVH, may be normal variant Possible Anterior infarct , age undetermined Abnormal ECG No significant change since last tracing Confirmed by Gilda Crease [phone]) on 10/05/2018 4:45:06 AM   Radiology Dg Chest 2 View  Result Date: 10/04/2018 CLINICAL DATA:  Shortness of breath EXAM: CHEST - 2 VIEW COMPARISON:  08/24/2016 FINDINGS: Cardiomegaly with vascular congestion. Mild interstitial and hazy opacity bilaterally. No pleural effusion. No pneumothorax. IMPRESSION: Cardiomegaly with vascular congestion and mild diffuse bilateral interstitial and hazy lung opacity likely edema. Negative for pneumothorax or pleural effusion. Electronically Signed   By: Jasmine Pang M.D.   On: 10/04/2018 20:40    Procedures Procedures (including critical care time)  Medications Ordered in ED Medications  sodium chloride flush (NS) 0.9 % injection 3 mL (has no administration in time range)  HYDROmorphone (DILAUDID) injection 1 mg (1 mg Intravenous Given 10/05/18 0500)  ondansetron (ZOFRAN) injection 4 mg (4 mg Intravenous Given 10/05/18 0500)     Initial Impression / Assessment and Plan / ED Course  I have reviewed the triage vital signs and the nursing notes.  Pertinent labs & imaging results that were available during my care of the patient were reviewed by me and considered in my medical decision making (see chart for details).        Patient presents to the emergency department for evaluation of right-sided chest wall pain that has been present since he fell while walking up concrete steps.  He landed directly on his right side.  He has been having trouble sleeping because of the pain.  He is starting to feel short of breath which caused him to present tonight.  Patient has significant right-sided  chest wall tenderness without crepitance.  X-ray does not show any displaced rib fractures. Patient to be treated with analgesia for his rib contusion.   Patient noted to have some evidence of edema on his chest x-ray.  He has a stated history of nonischemic cardiomyopathy.  It appears that he has not had any follow-up for this.  He does not see a cardiologist.  Patient does have some edema of the bilateral lower extremities as well.  He reports that this is chronic for him.  His BNP is only 68.  Patient is obese and was noted to have low oxygen saturations and snoring while sleeping here in the department.  This is likely contributory.  He does need further work-up for what appears to be obstructive sleep apnea as well as to determine what his ejection fraction is (previously was 35%).  Patient will need to follow-up with cardiology, will give referral.  Will start on low-dose Lasix.  He has been on lisinopril in the past for his hypertension.  He failed beta-blocker therapy because of bradycardia.  Will restart lisinopril.  Final Clinical Impressions(s) / ED Diagnoses   Final diagnoses:  Chest wall contusion, right, initial encounter  Cardiomyopathy, unspecified type [facility])    ED Discharge Orders         Ordered    lisinopril (ZESTRIL) 20 MG tablet  Daily     10/05/18 0644    furosemide (LASIX) 20 MG tablet  Daily     10/05/18 0644    potassium chloride SA (K-DUR) 10 MEQ tablet  Daily     10/05/18 0644    traMADol (ULTRAM) 50 MG tablet  Every 6 hours PRN     10/05/18 0644  Orpah Greek, MD 10/05/18 [phone]

## 2018-10-05 NOTE — ED Notes (Signed)
BNP to be drawn from previous collection.

## 2018-10-05 NOTE — ED Notes (Signed)
Pt sleeping decrease spO2 high 80s, placed on 2L while sleeping increased 94%

## 2018-10-05 NOTE — Discharge Instructions (Addendum)
You absolutely need to follow-up with a cardiologist as soon as possible to have your heart rechecked.  Please call the number listed above.  You need to start taking your blood pressure medication again, I have given you a refill.  Additionally, there are other prescription medications provided to help your heart as well as to treat the pain from your ribs.

## 2018-10-09 IMAGING — CT CT HEAD W/O CM
4 series · 17 of 47 positions shown, 19 images · non-contrast
Comparison: 01/11/2016

CLINICAL DATA: Essential hypertension, subacute neurologic
deficits.

EXAM:
CT HEAD WITHOUT CONTRAST
TECHNIQUE: Contiguous axial images were obtained from the base of the skull
through the vertex without intravenous contrast.

[Series 3: head without · axial · non-contrast · 0.46mm/px · z∈[-80,+60]mm · 7 of 38 slices shown, 9 images]
[im 5/38  brain]
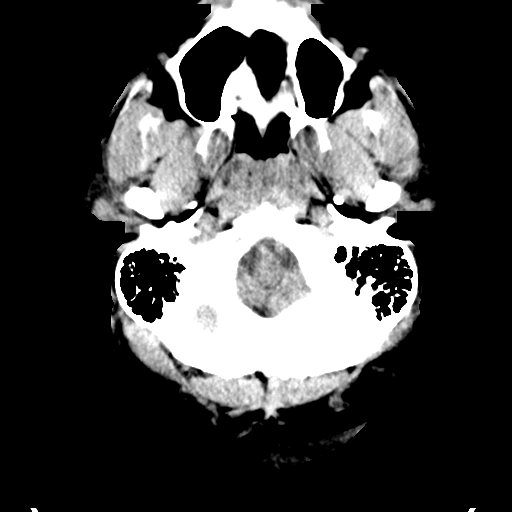
[im 5/38  bone]
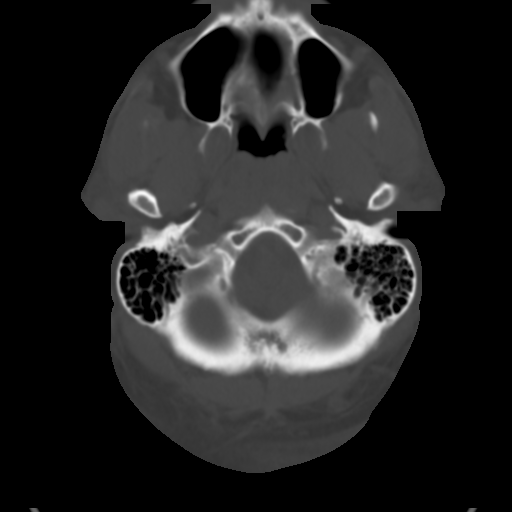
[im 10/38  brain]
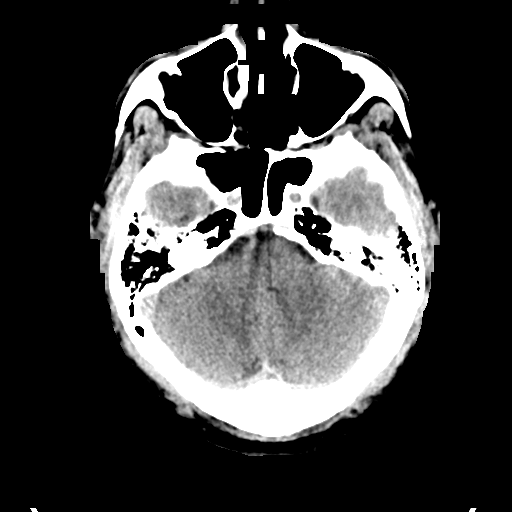
[im 14/38  brain]
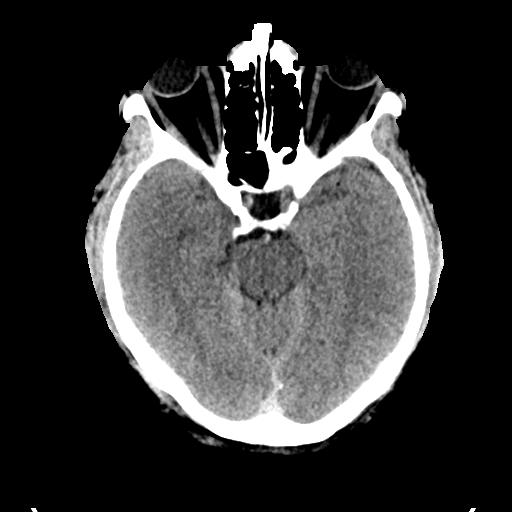
[im 19/38  brain]
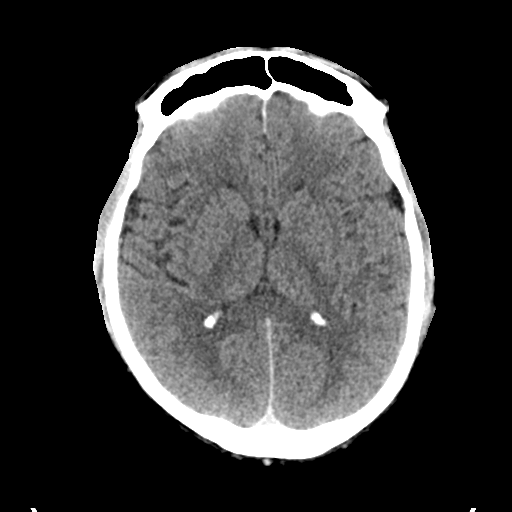
[im 24/38  brain]
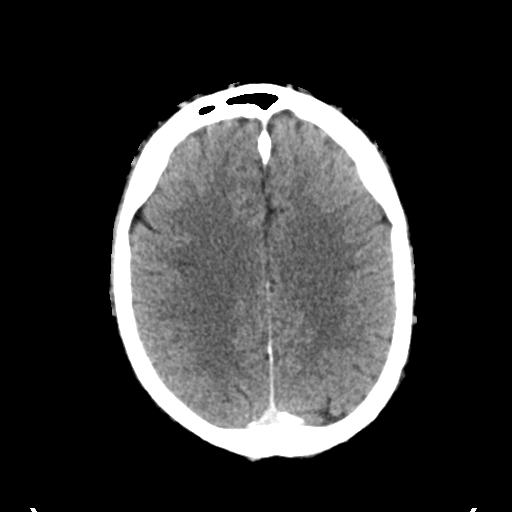
[im 24/38  bone]
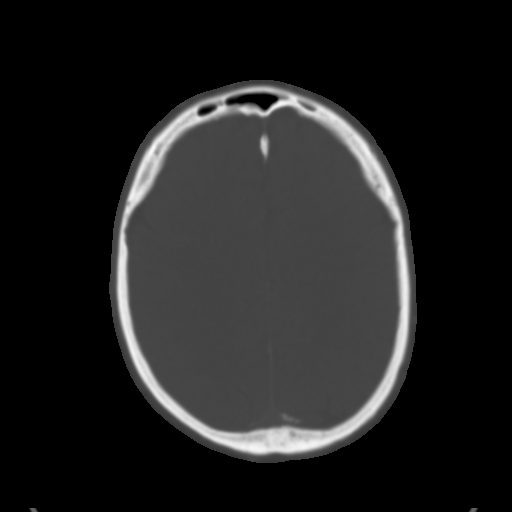
[im 28/38  brain]
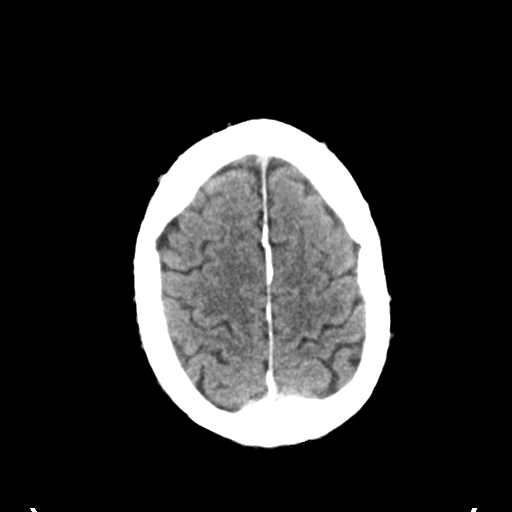
[im 33/38  brain]
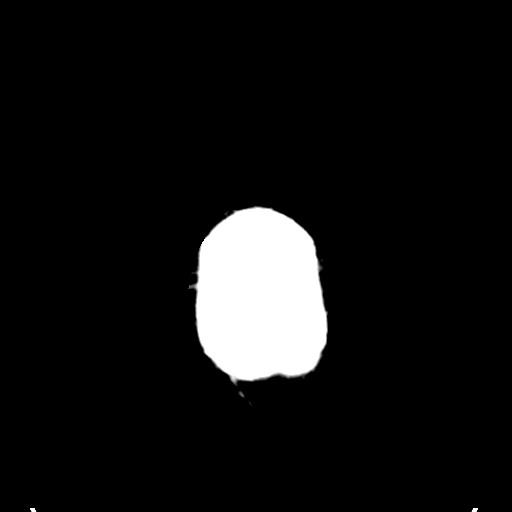

[Series 4: head bone · axial · 0.46mm/px · z∈[-82,-18]mm · 4 of 93 slices shown]
[im 10/93  bone]
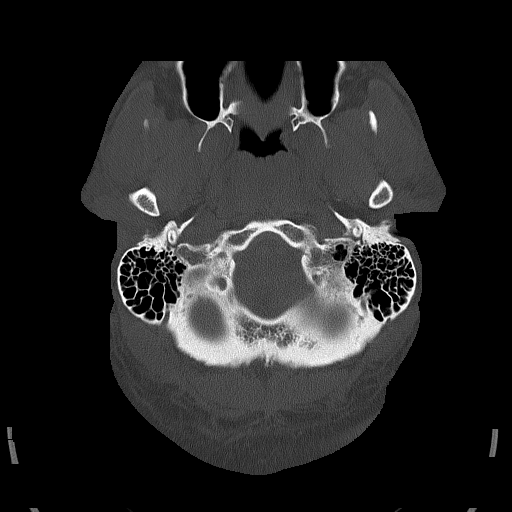
[im 19/93  bone]
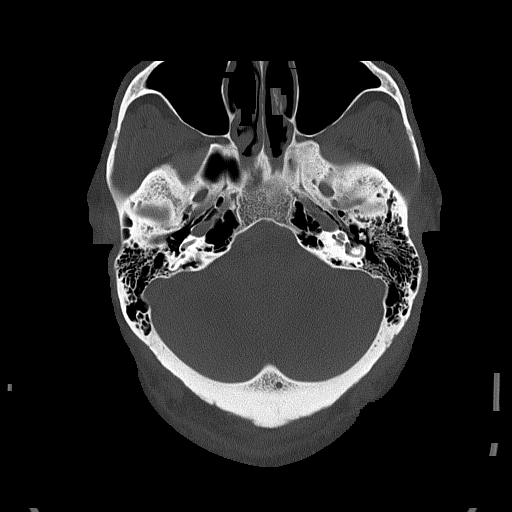
[im 28/93  bone]
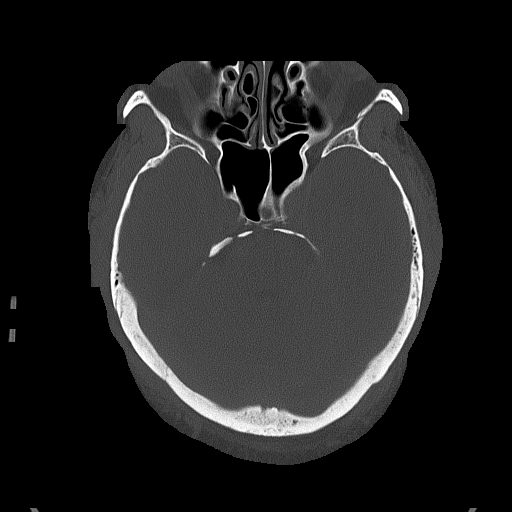
[im 42/93  bone]
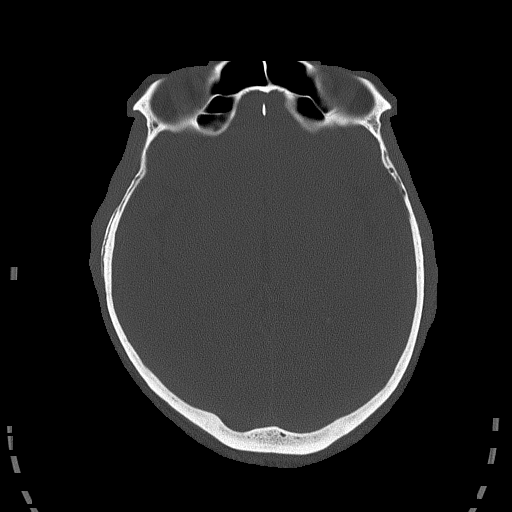

[Series 5: head without cor · coronal · non-contrast · 0.34mm/px · 3 of 72 slices shown]
[im 24/72  brain]
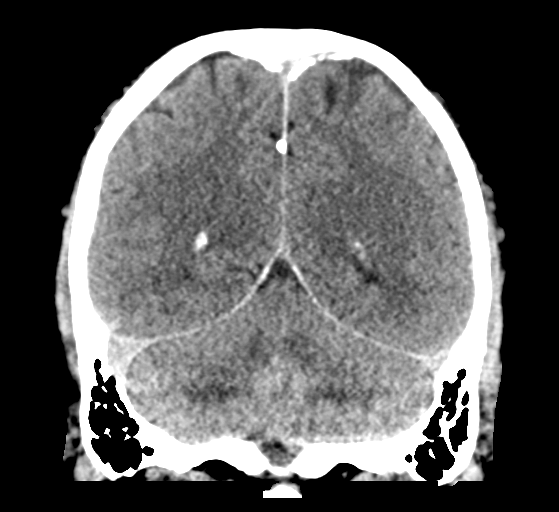
[im 32/72  brain]
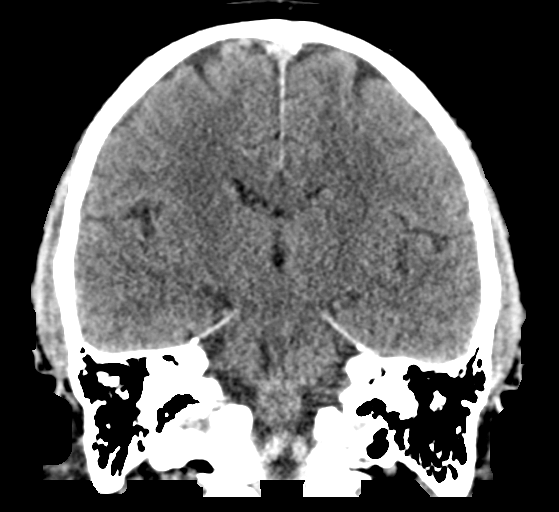
[im 40/72  brain]
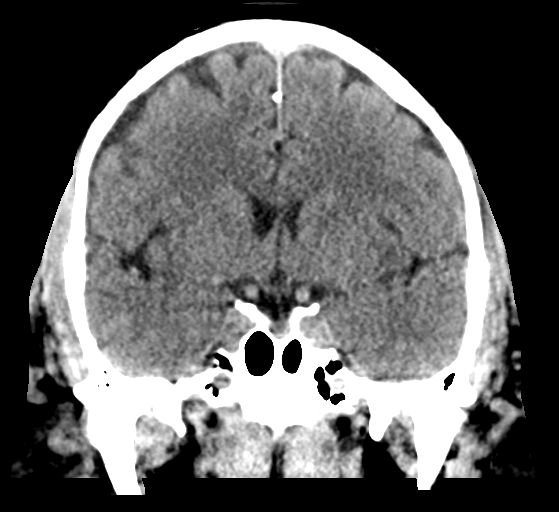

[Series 6: head without sag · sagittal · non-contrast · 0.35mm/px · 3 of 66 slices shown]
[im 22/66  brain]
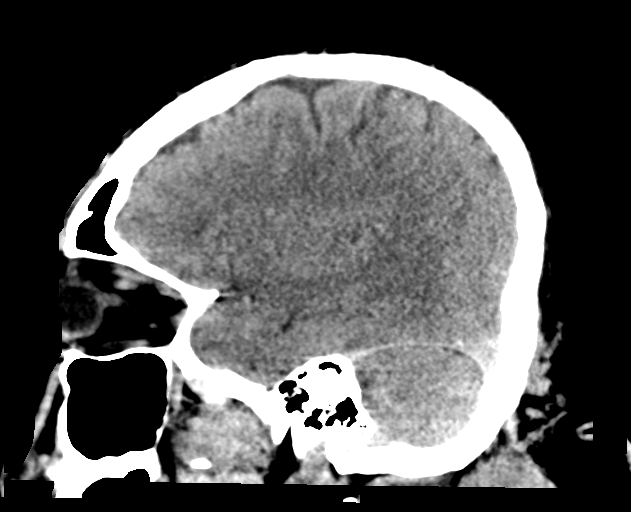
[im 33/66  brain]
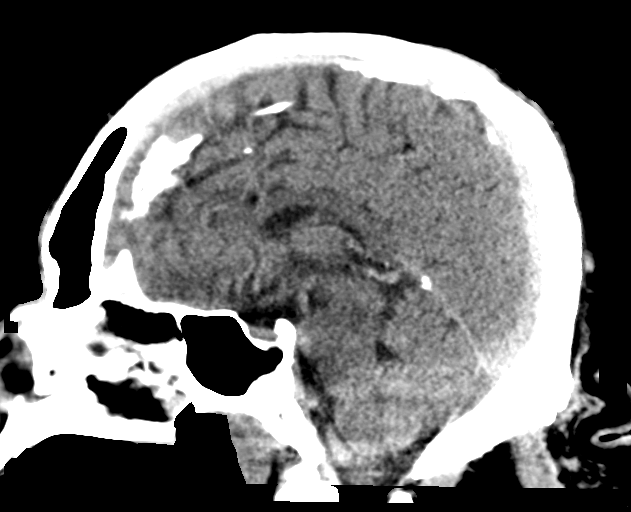
[im 44/66  brain]
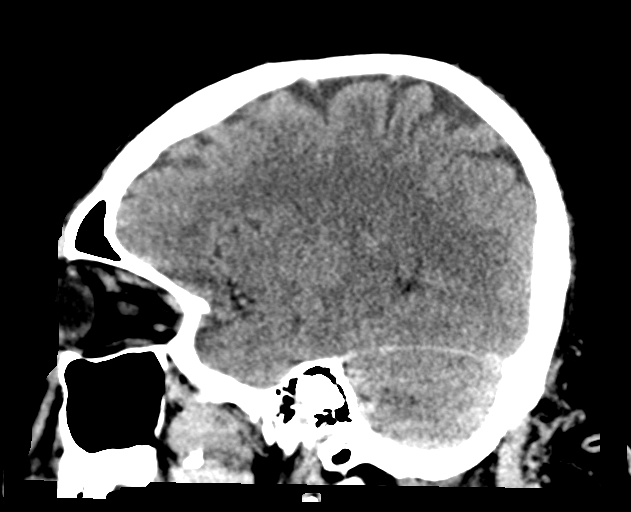

[17 of 47 positions shown; findings below may reference images not displayed]

FINDINGS: Brain: No evidence of acute infarction, hemorrhage, hydrocephalus,
extra-axial collection or mass lesion/mass effect. Stable dural
calcifications.

Vascular: No hyperdense vessel or unexpected calcification.

Skull: Normal. Negative for fracture or focal lesion.

Sinuses/Orbits: Minimal frontal sinus mucosal thickening on the
right. No acute sinus disease. Intact orbits and globes.

Other: None
IMPRESSION: No acute intracranial abnormality.  No significant change.

## 2018-10-09 IMAGING — CR DG CHEST 2V
2 series · 2 of 2 positions shown · non-contrast
Comparison: 01/22/2015 chest radiograph.

CLINICAL DATA: 51 y/o M; sudden onset of chest pressure and global
weakness.

EXAM:
CHEST  2 VIEW

[chest lat]
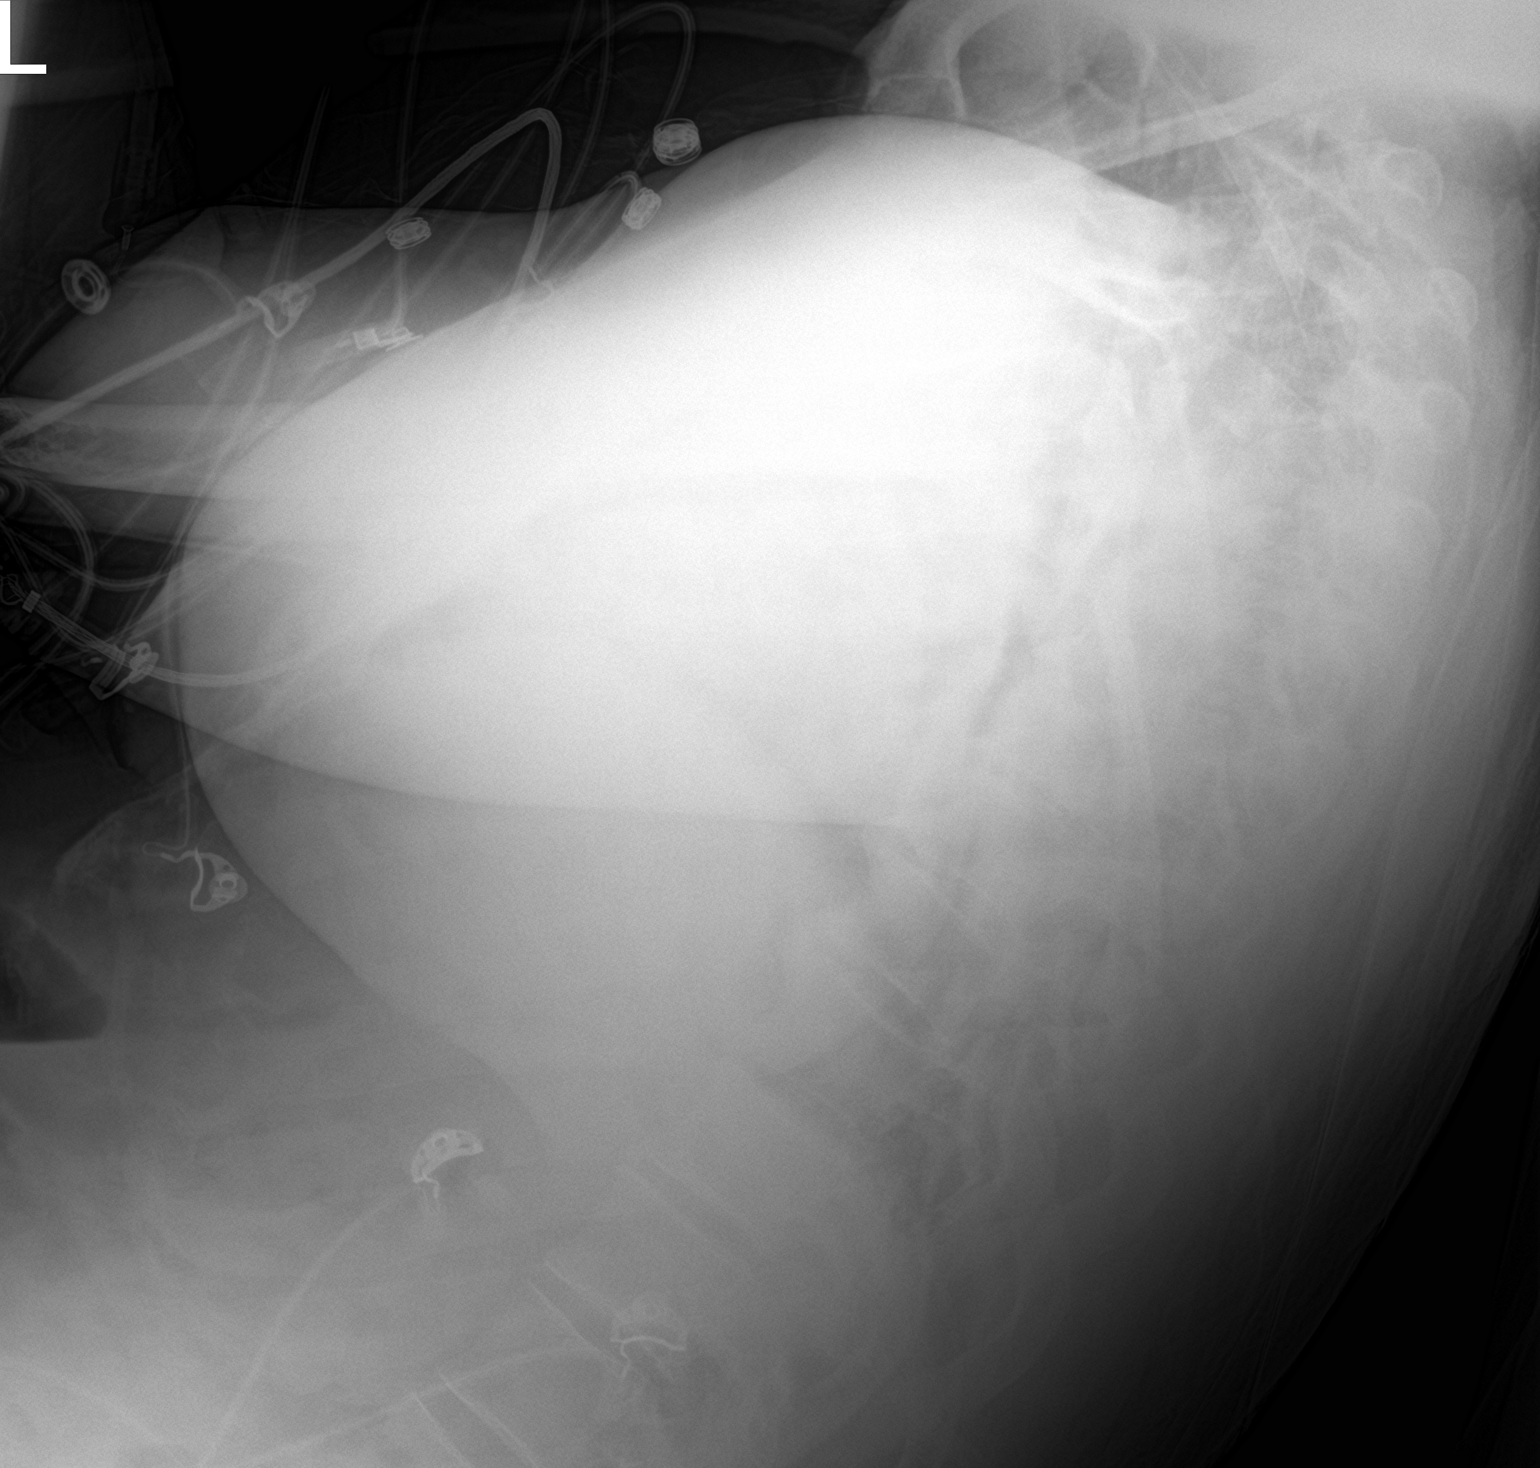

[chest ap]
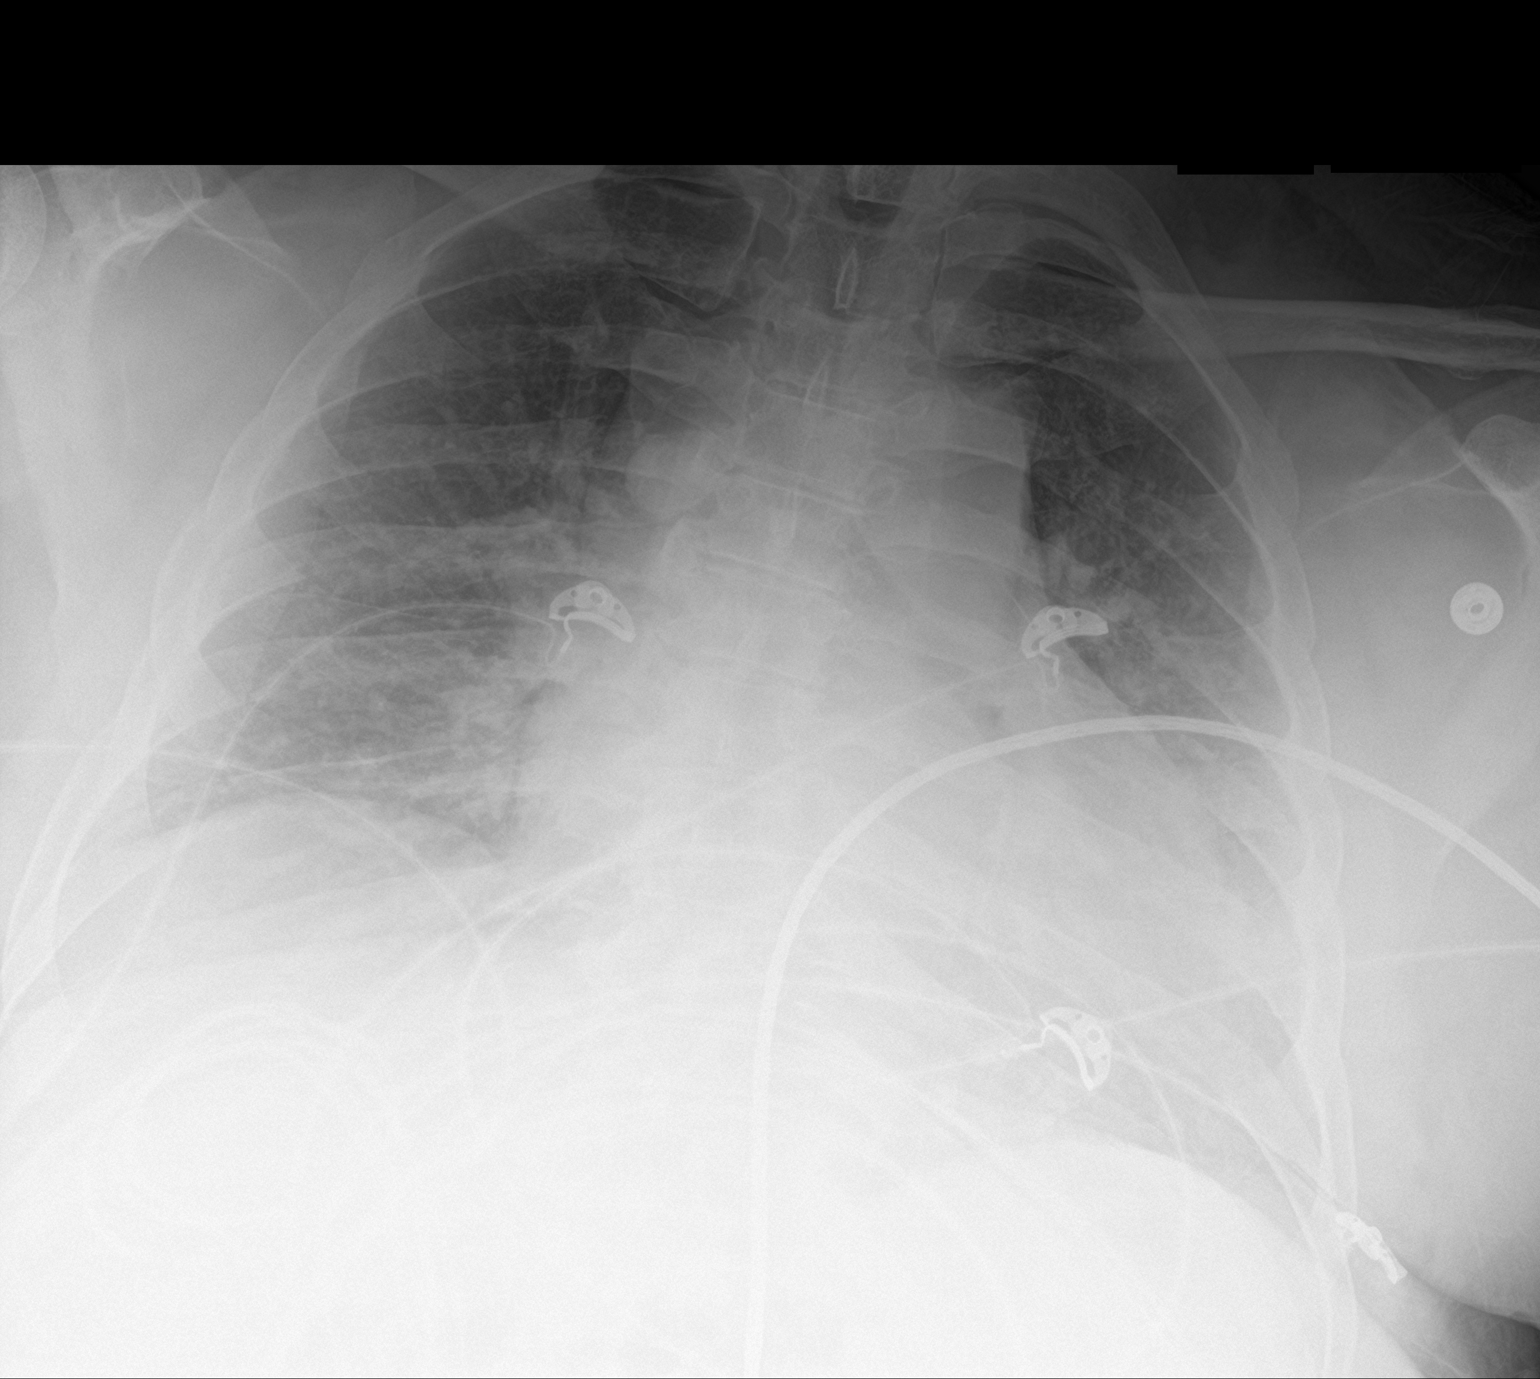

[2 of 2 positions shown; findings below may reference images not displayed]

FINDINGS: Mild cardiomegaly. Diffuse hazy opacification of lungs probably
representing mild pulmonary edema. No focal consolidation. No
pleural effusion. No pneumothorax. Bones are unremarkable.
IMPRESSION: Cardiomegaly and diffuse hazy opacification of lungs probably
representing mild pulmonary edema.

By: Jeonghee Dhany M.D.

## 2018-10-12 ENCOUNTER — Inpatient Hospital Stay (HOSPITAL_COMMUNITY)
Admission: EM | Admit: 2018-10-12 | Discharge: 2018-10-14 | DRG: 292 | Disposition: A | Discharge status: Home / Self Care | Payer: Medicaid - Out of State | Attending: Internal Medicine | Admitting: Internal Medicine

## 2018-10-12 ENCOUNTER — Encounter (HOSPITAL_COMMUNITY): Payer: Self-pay | Admitting: Emergency Medicine

## 2018-10-12 ENCOUNTER — Emergency Department (HOSPITAL_COMMUNITY): Payer: Medicaid - Out of State

## 2018-10-12 DIAGNOSIS — Z87891 Personal history of nicotine dependence: Secondary | ICD-10-CM | POA: Diagnosis not present

## 2018-10-12 DIAGNOSIS — Z888 Allergy status to other drugs, medicaments and biological substances status: Secondary | ICD-10-CM

## 2018-10-12 DIAGNOSIS — Z79899 Other long term (current) drug therapy: Secondary | ICD-10-CM | POA: Diagnosis not present

## 2018-10-12 DIAGNOSIS — N179 Acute kidney failure, unspecified: Secondary | ICD-10-CM | POA: Diagnosis present

## 2018-10-12 DIAGNOSIS — Z20828 Contact with and (suspected) exposure to other viral communicable diseases: Secondary | ICD-10-CM | POA: Diagnosis present

## 2018-10-12 DIAGNOSIS — R0602 Shortness of breath: Secondary | ICD-10-CM

## 2018-10-12 DIAGNOSIS — I5042 Chronic combined systolic (congestive) and diastolic (congestive) heart failure: Secondary | ICD-10-CM | POA: Diagnosis present

## 2018-10-12 DIAGNOSIS — F1290 Cannabis use, unspecified, uncomplicated: Secondary | ICD-10-CM | POA: Diagnosis present

## 2018-10-12 DIAGNOSIS — I509 Heart failure, unspecified: Secondary | ICD-10-CM

## 2018-10-12 DIAGNOSIS — I5023 Acute on chronic systolic (congestive) heart failure: Secondary | ICD-10-CM | POA: Diagnosis not present

## 2018-10-12 DIAGNOSIS — Z6841 Body Mass Index (BMI) 40.0 and over, adult: Secondary | ICD-10-CM | POA: Diagnosis not present

## 2018-10-12 DIAGNOSIS — Z8249 Family history of ischemic heart disease and other diseases of the circulatory system: Secondary | ICD-10-CM | POA: Diagnosis not present

## 2018-10-12 DIAGNOSIS — I428 Other cardiomyopathies: Secondary | ICD-10-CM | POA: Diagnosis present

## 2018-10-12 DIAGNOSIS — I110 Hypertensive heart disease with heart failure: Secondary | ICD-10-CM | POA: Diagnosis present

## 2018-10-12 DIAGNOSIS — I340 Nonrheumatic mitral (valve) insufficiency: Secondary | ICD-10-CM | POA: Diagnosis not present

## 2018-10-12 DIAGNOSIS — R0781 Pleurodynia: Secondary | ICD-10-CM | POA: Diagnosis present

## 2018-10-12 DIAGNOSIS — R079 Chest pain, unspecified: Secondary | ICD-10-CM | POA: Diagnosis not present

## 2018-10-12 LAB — CBC WITH DIFFERENTIAL/PLATELET
Abs Immature Granulocytes: 0 10*3/uL (ref 0.00–0.07)
Basophils Absolute: 0.1 10*3/uL (ref 0.0–0.1)
Basophils Relative: 1 %
Eosinophils Absolute: 0.3 10*3/uL (ref 0.0–0.5)
Eosinophils Relative: 4 %
HCT: 55.5 % — ABNORMAL HIGH (ref 39.0–52.0)
Hemoglobin: 18.7 g/dL — ABNORMAL HIGH (ref 13.0–17.0)
Lymphocytes Relative: 24 %
Lymphs Abs: 1.9 10*3/uL (ref 0.7–4.0)
MCH: 29 pg (ref 26.0–34.0)
MCHC: 33.7 g/dL (ref 30.0–36.0)
MCV: 86 fL (ref 80.0–100.0)
Monocytes Absolute: 0.6 10*3/uL (ref 0.1–1.0)
Monocytes Relative: 7 %
Neutro Abs: 5.1 10*3/uL (ref 1.7–7.7)
Neutrophils Relative %: 64 %
Platelets: 222 10*3/uL (ref 150–400)
RBC: 6.45 MIL/uL — ABNORMAL HIGH (ref 4.22–5.81)
RDW: 15.3 % (ref 11.5–15.5)
WBC: 7.9 10*3/uL (ref 4.0–10.5)
nRBC: 0 % (ref 0.0–0.2)
nRBC: 0 /100 WBC

## 2018-10-12 LAB — BASIC METABOLIC PANEL
Anion gap: 9 (ref 5–15)
BUN: 10 mg/dL (ref 6–20)
CO2: 27 mmol/L (ref 22–32)
Calcium: 9.1 mg/dL (ref 8.9–10.3)
Chloride: 99 mmol/L (ref 98–111)
Creatinine, Ser: 1.25 mg/dL — ABNORMAL HIGH (ref 0.61–1.24)
GFR calc Af Amer: >60 mL/min (ref >60)
GFR calc non Af Amer: >60 mL/min (ref >60)
Glucose, Bld: 104 mg/dL — ABNORMAL HIGH (ref 70–99)
Potassium: 4.3 mmol/L (ref 3.5–5.1)
Sodium: 135 mmol/L (ref 135–145)

## 2018-10-12 LAB — TROPONIN I (HIGH SENSITIVITY)
Troponin I (High Sensitivity): 7 ng/L (ref <18)
Troponin I (High Sensitivity): 7 ng/L (ref <18)

## 2018-10-12 LAB — BRAIN NATRIURETIC PEPTIDE: B Natriuretic Peptide: 31.5 pg/mL (ref 0.0–100.0)

## 2018-10-12 MED ORDER — ACETAMINOPHEN 650 MG RE SUPP: 650.0000 mg | Freq: Four times a day (QID) | RECTAL | Status: DC | PRN

## 2018-10-12 MED ORDER — ACETAMINOPHEN 325 MG PO TABS: 650.0000 mg | ORAL_TABLET | Freq: Four times a day (QID) | ORAL | Status: DC | PRN

## 2018-10-12 MED ORDER — MORPHINE SULFATE (PF) 2 MG/ML IV SOLN
2.0000 mg | INTRAVENOUS | Status: DC | PRN
  Administered 2018-10-13 – 2018-10-14 (×5): 2 mg via INTRAVENOUS
  Filled 2018-10-12 (×6): qty 1

## 2018-10-12 MED ORDER — ENOXAPARIN SODIUM 40 MG/0.4ML ~~LOC~~ SOLN
40.0000 mg | SUBCUTANEOUS | Status: DC
  Administered 2018-10-13 – 2018-10-14 (×2): 40 mg via SUBCUTANEOUS
  Filled 2018-10-12 (×2): qty 0.4

## 2018-10-12 MED ORDER — IOHEXOL 350 MG/ML SOLN
100.0000 mL | Freq: Once | INTRAVENOUS | Status: AC | PRN
  Administered 2018-10-12: 21:00:00 100 mL via INTRAVENOUS

## 2018-10-12 MED ORDER — HYDROCODONE-ACETAMINOPHEN 5-325 MG PO TABS
1.0000 | ORAL_TABLET | ORAL | Status: DC | PRN
  Administered 2018-10-13: 2 via ORAL
  Filled 2018-10-12: qty 2

## 2018-10-12 NOTE — ED Provider Notes (Signed)
[facility] EMERGENCY DEPARTMENT Provider Note   CSN: [id number] Arrival date & time: 10/12/18  1347     History   Chief Complaint Chief Complaint  Patient presents with   Rib Injury    HPI Kenneth Gould is a 53 y.o. male with history of nonischemic cardiomyopathy with a EF of 35%, morbid obesity, hypertension, CHF presents today for right rib pain and shortness of breath.  Patient suffered fall on stairs 1 week ago he was subsequently seen in the ER, noted to have pulmonary edema was given p.o. Lasix, tramadol and discharged.  He reports that symptoms have continued to worsen since that time reports increasing shortness of breath and right rib pain he is bracing his right ribs during our evaluation.  He describes pain as a severe constant sharp pain worsened with movement palpation without alleviating factors or radiation.  Patient reports shortness of breath as pain with deep breathing and difficulty taking a full breath.  Denies fever/chills, headache, nausea/vomiting, diarrhea, abdominal pain, cough/hemoptysis or any additional concerns.     HPI  Past Medical History:  Diagnosis Date   Essential hypertension 10/24/2014   Family history of premature CAD    NICM (nonischemic cardiomyopathy) (HCC)    a. 10/2014 Cath: nl cors, EF 35-45%, glob HK.   Obesity (BMI 30-39.9)     Patient Active Problem List   Diagnosis Date Noted   Atypical chest pain 08/24/2016   Chronic systolic CHF (congestive heart failure) (HCC)    Chest pain    Unstable angina pectoris (HCC) 10/25/2014   NICM (nonischemic cardiomyopathy) (HCC) 10/25/2014   Chest pain with moderate risk for cardiac etiology 10/24/2014   Essential hypertension 10/24/2014   Chest pain, moderate coronary artery risk 10/24/2014    Past Surgical History:  Procedure Laterality Date   CARDIAC CATHETERIZATION N/A 10/25/2014   Procedure: Left Heart Cath and Coronary Angiography;  Surgeon: Lyn Records, MD;  Location: [facility] INVASIVE CV LAB;  Service: Cardiovascular;  Laterality: N/A;   None          Home Medications    Prior to Admission medications   Medication Sig Start Date End Date Taking? Authorizing Provider  furosemide (LASIX) 20 MG tablet Take 1 tablet (20 mg total) by mouth daily. 10/05/18  Yes Pollina, Canary Brim, MD  lisinopril (ZESTRIL) 20 MG tablet Take 1 tablet (20 mg total) by mouth daily. 10/05/18  Yes Pollina, Canary Brim, MD  naproxen sodium (ALEVE) 220 MG tablet Take 220-440 mg by mouth 2 (two) times daily as needed (for pain).    Yes [provider]  potassium chloride SA (K-DUR) 10 MEQ tablet Take 2 tablets (20 mEq total) by mouth daily. 10/05/18  Yes Pollina, Canary Brim, MD  traMADol (ULTRAM) 50 MG tablet Take 1 tablet (50 mg total) by mouth every 6 (six) hours as needed. Patient not taking: Reported on 10/12/2018 10/05/18   Gilda Crease, MD  famotidine (PEPCID) 20 MG tablet Take 1 tablet (20 mg total) by mouth 2 (two) times daily. Patient not taking: Reported on 10/05/2018 08/26/16 10/05/18  Joseph Art, DO  hydrochlorothiazide (MICROZIDE) 12.5 MG capsule Take 1 capsule (12.5 mg total) by mouth daily. Patient not taking: Reported on 10/05/2018 08/27/16 10/05/18  Joseph Art, DO    Family History Family History  Problem Relation Age of Onset   Heart attack Sister 21       Died in her sleep, dx at autopsy   Heart attack  Cousin 33       Died in his sleep    Social History Social History   Tobacco Use   Smoking status: Former Smoker    Packs/day: 0.50    Years: 30.00    Pack years: 15.00    Types: Cigarettes    Quit date: 08/25/2015    Years since quitting: 3.1   Smokeless tobacco: Never Used  Substance Use Topics   Alcohol use: No   Drug use: Yes    Types: Marijuana    Comment: THC     Allergies   Nyquil multi-symptom [pseudoeph-doxylamine-dm-apap]   Review of Systems Review of Systems Ten systems are  reviewed and are negative for acute change except as noted in the HPI   Physical Exam Updated Vital Signs BP (!) 150/76    Pulse 70    Temp 98.5 F (36.9 C) (Oral)    Resp 14    SpO2 96%   Physical Exam Constitutional:      General: He is not in acute distress.    Appearance: Normal appearance. He is well-developed. He is not ill-appearing or diaphoretic.  HENT:     Head: Normocephalic and atraumatic.     Right Ear: External ear normal.     Left Ear: External ear normal.     Nose: Nose normal.  Eyes:     General: Vision grossly intact. Gaze aligned appropriately.     Pupils: Pupils are equal, round, and reactive to light.  Neck:     Musculoskeletal: Normal range of motion.     Trachea: Trachea and phonation normal. No tracheal deviation.  Cardiovascular:     Rate and Rhythm: Normal rate and regular rhythm.     Pulses: Normal pulses.     Heart sounds: Normal heart sounds.  Pulmonary:     Effort: Pulmonary effort is normal. No accessory muscle usage or respiratory distress.     Breath sounds: Decreased breath sounds present.  Chest:     Chest wall: Tenderness present.    Abdominal:     General: There is no distension.     Palpations: Abdomen is soft.     Tenderness: There is no abdominal tenderness. There is no guarding or rebound.  Musculoskeletal: Normal range of motion.  Skin:    General: Skin is warm and dry.  Neurological:     Mental Status: He is alert.     GCS: GCS eye subscore is 4. GCS verbal subscore is 5. GCS motor subscore is 6.     Comments: Speech is clear and goal oriented, follows commands Major Cranial nerves without deficit, no facial droop Moves extremities without ataxia, coordination intact  Psychiatric:        Behavior: Behavior normal.      ED Treatments / Results  Labs (all labs ordered are listed, but only abnormal results are displayed) Labs Reviewed  CBC WITH DIFFERENTIAL/PLATELET - Abnormal; Notable for the following components:       Result Value   RBC 6.45 (*)    Hemoglobin 18.7 (*)    HCT 55.5 (*)    All other components within normal limits  BASIC METABOLIC PANEL - Abnormal; Notable for the following components:   Glucose, Bld 104 (*)    Creatinine, Ser 1.25 (*)    All other components within normal limits  BRAIN NATRIURETIC PEPTIDE  TROPONIN I (HIGH SENSITIVITY)  TROPONIN I (HIGH SENSITIVITY)    EKG EKG Interpretation  Date/Time:  Thursday October 12 2018 18:39:41  EDT Ventricular Rate:  69 PR Interval:    QRS Duration: 97 QT Interval:  424 QTC Calculation: 455 R Axis:   -61 Text Interpretation:  /Sinus rhythm /Inferior infarct, old When compared with ECG of 10/04/2018 No significant change was found Reconfirmed by Dione BoozeGlick, David ([id number]) on 10/12/2018 11:31:11 PM   Radiology Dg Chest 2 View  Result Date: 10/12/2018 CLINICAL DATA:  Rib pain EXAM: CHEST - 2 VIEW COMPARISON:  10/04/2018 FINDINGS: Cardiomegaly. No significant change in mild diffuse interstitial pulmonary opacity. No new or focal airspace opacity. The visualized skeletal structures are unremarkable. IMPRESSION: 1. No displaced fracture or other radiographic abnormality of the right ribs to explain pain. 2. Cardiomegaly with mild, diffuse interstitial pulmonary opacity, unchanged from prior examination and likely edema. No new or focal airspace opacity. Electronically Signed   By: Lauralyn PrimesAlex  Bibbey M.D.   On: 10/12/2018 14:36   Ct Angio Chest Pe W And/or Wo Contrast  Result Date: 10/12/2018 CLINICAL DATA:  53 year old male with shortness of breath. Concern for pulmonary embolism. EXAM: CT ANGIOGRAPHY CHEST WITH CONTRAST TECHNIQUE: Multidetector CT imaging of the chest was performed using the standard protocol during bolus administration of intravenous contrast. Multiplanar CT image reconstructions and MIPs were obtained to evaluate the vascular anatomy. CONTRAST:  100mL OMNIPAQUE IOHEXOL 350 MG/ML SOLN COMPARISON:  Chest CT dated 08/24/2016 FINDINGS:  Cardiovascular: There is mild cardiomegaly. No pericardial effusion. The thoracic aorta is unremarkable. Evaluation of the pulmonary arteries is somewhat limited due to suboptimal opacification of the peripheral branches. No large or central pulmonary artery embolus identified. Mediastinum/Nodes: There is no hilar or mediastinal adenopathy. The esophagus is grossly unremarkable. No mediastinal fluid collection. Lungs/Pleura: Mild diffuse vascular prominence and hazy airspace density may represent congestion and mild edema. Pneumonia is not excluded. Clinical correlation is recommended. No focal consolidation, pleural effusion, or pneumothorax. The central airways are patent. Upper Abdomen: No acute abnormality. Musculoskeletal: Mild degenerative changes of the spine. No acute osseous pathology. Review of the MIP images confirms the above findings. IMPRESSION: 1. No CT evidence of central pulmonary artery embolus. 2. Mild cardiomegaly with probable mild vascular congestion and edema. Pneumonia is not excluded. Clinical correlation is recommended. Electronically Signed   By: Elgie CollardArash  Radparvar M.D.   On: 10/12/2018 21:47    Procedures Procedures (including critical care time)  Medications Ordered in ED Medications  iohexol (OMNIPAQUE) 350 MG/ML injection 100 mL (100 mLs Intravenous Contrast Given 10/12/18 2114)     Initial Impression / Assessment and Plan / ED Course  I have reviewed the triage vital signs and the nursing notes.  Pertinent labs & imaging results that were available during my care of the patient were reviewed by me and considered in my medical decision making (see chart for details).  Clinical Course as of Oct 12 2335  Thu Oct 12, 2018  2225 Hospitalist   [BM]    Clinical Course User Index [BM] Bill SalinasMorelli, Isamar Wellbrock A, PA-C       CMP nonacute BMP nonacute, elevation of creatinine at 1.25 slightly above baseline BNP within normal limit High-sensitivity troponin: 7 Chest x-ray:    IMPRESSION:  1. No displaced fracture or other radiographic abnormality of the  right ribs to explain pain.    2. Cardiomegaly with mild, diffuse interstitial pulmonary opacity,  unchanged from prior examination and likely edema. No new or focal  airspace opacity.  - On reevaluation no change to presentation does not appear in acute distress but appears somewhat uncomfortable. - CT Angio:  IMPRESSION:  1. No CT evidence of central pulmonary artery embolus.  2. Mild cardiomegaly with probable mild vascular congestion and  edema. Pneumonia is not excluded. Clinical correlation is  recommended.   EKG:  /Sinus rhythm /Inferior infarct, old When compared with ECG of 10/04/2018 No significant change was found Reconfirmed by Delora Fuel ([id number]) on 10/12/2018 11:31:11 PM - Patient continually dropping SPO2 to the low 90s during examination.  He is bracing his right ribs, this may be secondary to his pain and bracing or to this pulmonary edema.  He has been taking Lasix consistently and denies change in his urinary output and has increasing shortness of breath.  No evidence of pulmonary embolism today.  Discussed case with Dr. Roslynn Amble patient will need admission for shortness of breath and intractable pain.  Do not suspect infectious etiology at this time as he is without tachycardia or fever. - Discussed case with hospitalist will be seeing patient for admission.    Note: Portions of this report may have been transcribed using voice recognition software. Every effort was made to ensure accuracy; however, inadvertent computerized transcription errors may still be present. Final Clinical Impressions(s) / ED Diagnoses   Final diagnoses:  Rib pain on right side  Shortness of breath    ED Discharge Orders    None       Gari Crown 10/12/18 2337    Lucrezia Starch, MD 10/14/18 [phone]

## 2018-10-12 NOTE — ED Triage Notes (Signed)
Pt here as follow up after being seen here on 9/17 after falling down 3 stairs - pt was noted to have some pulmonary edema but know rib fracture or pneumothorax. Pt was started on lasix.  However pt states pain on the right side of his rib cage is worse.

## 2018-10-13 ENCOUNTER — Other Ambulatory Visit: Payer: Self-pay

## 2018-10-13 ENCOUNTER — Inpatient Hospital Stay (HOSPITAL_COMMUNITY): Payer: Medicaid - Out of State

## 2018-10-13 ENCOUNTER — Encounter (HOSPITAL_COMMUNITY): Payer: Self-pay | Admitting: *Deleted

## 2018-10-13 DIAGNOSIS — I340 Nonrheumatic mitral (valve) insufficiency: Secondary | ICD-10-CM

## 2018-10-13 DIAGNOSIS — N179 Acute kidney failure, unspecified: Secondary | ICD-10-CM

## 2018-10-13 DIAGNOSIS — R0781 Pleurodynia: Secondary | ICD-10-CM

## 2018-10-13 DIAGNOSIS — I5023 Acute on chronic systolic (congestive) heart failure: Secondary | ICD-10-CM

## 2018-10-13 LAB — ECHOCARDIOGRAM COMPLETE
Height: 69 in
Weight: 5137.6 oz

## 2018-10-13 LAB — BASIC METABOLIC PANEL
Anion gap: 8 (ref 5–15)
BUN: 9 mg/dL (ref 6–20)
CO2: 28 mmol/L (ref 22–32)
Calcium: 9.1 mg/dL (ref 8.9–10.3)
Chloride: 102 mmol/L (ref 98–111)
Creatinine, Ser: 1.12 mg/dL (ref 0.61–1.24)
GFR calc Af Amer: >60 mL/min (ref >60)
GFR calc non Af Amer: >60 mL/min (ref >60)
Glucose, Bld: 123 mg/dL — ABNORMAL HIGH (ref 70–99)
Potassium: 3.7 mmol/L (ref 3.5–5.1)
Sodium: 138 mmol/L (ref 135–145)

## 2018-10-13 LAB — CBC
HCT: 53 % — ABNORMAL HIGH (ref 39.0–52.0)
Hemoglobin: 18 g/dL — ABNORMAL HIGH (ref 13.0–17.0)
MCH: 29.5 pg (ref 26.0–34.0)
MCHC: 34 g/dL (ref 30.0–36.0)
MCV: 86.7 fL (ref 80.0–100.0)
Platelets: 205 10*3/uL (ref 150–400)
RBC: 6.11 MIL/uL — ABNORMAL HIGH (ref 4.22–5.81)
RDW: 14.9 % (ref 11.5–15.5)
WBC: 7.3 10*3/uL (ref 4.0–10.5)
nRBC: 0 % (ref 0.0–0.2)

## 2018-10-13 LAB — SARS CORONAVIRUS 2 (TAT 6-24 HRS): SARS Coronavirus 2: NEGATIVE

## 2018-10-13 LAB — HIV ANTIBODY (ROUTINE TESTING W REFLEX): HIV Screen 4th Generation wRfx: NONREACTIVE

## 2018-10-13 MED ORDER — FUROSEMIDE 10 MG/ML IJ SOLN
40.0000 mg | Freq: Every day | INTRAMUSCULAR | Status: DC
  Administered 2018-10-13 – 2018-10-14 (×2): 40 mg via INTRAVENOUS
  Filled 2018-10-13 (×2): qty 4

## 2018-10-13 MED ORDER — LIDOCAINE 5 % EX PTCH
1.0000 | MEDICATED_PATCH | CUTANEOUS | Status: DC
  Administered 2018-10-13 – 2018-10-14 (×2): 1 via TRANSDERMAL
  Filled 2018-10-13 (×2): qty 1

## 2018-10-13 NOTE — ED Notes (Signed)
ED TO INPATIENT HANDOFF REPORT  ED Nurse Name and Phone #: Jeannett Senior [phone]  S Name/Age/Gender Kenneth Gould 53 y.o. male Room/Bed: 011C/011C  Code Status   Code Status: Full Code  Home/SNF/Other Home Patient oriented to: self, place, time and situation Is this baseline? Yes   Triage Complete: Triage complete  Chief Complaint R sided rib pain, Fluid on lungs  Triage Note Pt here as follow up after being seen here on 9/17 after falling down 3 stairs - pt was noted to have some pulmonary edema but know rib fracture or pneumothorax. Pt was started on lasix.  However pt states pain on the right side of his rib cage is worse.     Allergies Allergies  Allergen Reactions  . Nyquil Multi-Symptom [Pseudoeph-Doxylamine-Dm-Apap] Nausea And Vomiting    Level of Care/Admitting Diagnosis ED Disposition    ED Disposition Condition Comment   Admit  Hospital Area: [facility] [[id number]]  Level of Care: Telemetry Cardiac [103]  Covid Evaluation: Asymptomatic Screening Protocol (No Symptoms)  Diagnosis: Congestive heart failure (CHF) [facility]) [[id number]]  Admitting Physician: Anselm Jungling [[id number]]  Attending Physician: Anselm Jungling [[id number]]  Estimated length of stay: past midnight tomorrow  Certification:: I certify this patient will need inpatient services for at least 2 midnights  PT Class (Do Not Modify): Inpatient [101]  PT Acc Code (Do Not Modify): Private [1]       B Medical/Surgery History Past Medical History:  Diagnosis Date  . Essential hypertension 10/24/2014  . Family history of premature CAD   . NICM (nonischemic cardiomyopathy) (HCC)    a. 10/2014 Cath: nl cors, EF 35-45%, glob HK.  . Obesity (BMI 30-39.9)    Past Surgical History:  Procedure Laterality Date  . CARDIAC CATHETERIZATION N/A 10/25/2014   Procedure: Left Heart Cath and Coronary Angiography;  Surgeon: Lyn Records, MD;  Location: [facility] INVASIVE CV LAB;  Service: Cardiovascular;  Laterality: N/A;   . None       A IV Location/Drains/Wounds Patient Lines/Drains/Airways Status   Active Line/Drains/Airways    Name:   Placement date:   Placement time:   Site:   Days:   Peripheral IV 10/12/18 Right Antecubital   10/12/18    1854    Antecubital   1          Intake/Output Last 24 hours No intake or output data in the 24 hours ending 10/13/18 0005  Labs/Imaging Results for orders placed or performed during the hospital encounter of 10/12/18 (from the past 48 hour(s))  CBC with Differential     Status: Abnormal   Collection Time: 10/12/18  6:28 PM  Result Value Ref Range   WBC 7.9 4.0 - 10.5 K/uL   RBC 6.45 (H) 4.22 - 5.81 MIL/uL   Hemoglobin 18.7 (H) 13.0 - 17.0 g/dL   HCT 71.6 (H) 96.7 - 89.3 %   MCV 86.0 80.0 - 100.0 fL   MCH 29.0 26.0 - 34.0 pg   MCHC 33.7 30.0 - 36.0 g/dL   RDW 81.0 17.5 - 10.2 %   Platelets 222 150 - 400 K/uL   nRBC 0.0 0.0 - 0.2 %   Neutrophils Relative % 64 %   Neutro Abs 5.1 1.7 - 7.7 K/uL   Lymphocytes Relative 24 %   Lymphs Abs 1.9 0.7 - 4.0 K/uL   Monocytes Relative 7 %   Monocytes Absolute 0.6 0.1 - 1.0 K/uL   Eosinophils Relative 4 %   Eosinophils Absolute 0.3 0.0 -  0.5 K/uL   Basophils Relative 1 %   Basophils Absolute 0.1 0.0 - 0.1 K/uL   nRBC 0 0 /100 WBC   Abs Immature Granulocytes 0.00 0.00 - 0.07 K/uL   Tear Drop Cells PRESENT     Comment: Performed at [facility], [address], [place], [place] [address]  Basic metabolic panel     Status: Abnormal   Collection Time: 10/12/18  6:28 PM  Result Value Ref Range   Sodium 135 135 - 145 mmol/L   Potassium 4.3 3.5 - 5.1 mmol/L   Chloride 99 98 - 111 mmol/L   CO2 27 22 - 32 mmol/L   Glucose, Bld 104 (H) 70 - 99 mg/dL   BUN 10 6 - 20 mg/dL   Creatinine, Ser 1.28 (H) 0.61 - 1.24 mg/dL   Calcium 9.1 8.9 - 78.6 mg/dL   GFR calc non Af Amer >60 >60 mL/min   GFR calc Af Amer >60 >60 mL/min   Anion gap 9 5 - 15    Comment: Performed at [facility], [address],  [place], [place] [address]  Brain natriuretic peptide     Status: None   Collection Time: 10/12/18  6:28 PM  Result Value Ref Range   B Natriuretic Peptide 31.5 0.0 - 100.0 pg/mL    Comment: Performed at [facility], [address] [place], [place] [address]  Troponin I (High Sensitivity)     Status: None   Collection Time: 10/12/18  6:28 PM  Result Value Ref Range   Troponin I (High Sensitivity) 7 <18 ng/L    Comment: (NOTE) Elevated high sensitivity troponin I (hsTnI) values and significant  changes across serial measurements may suggest ACS but many other  chronic and acute conditions are known to elevate hsTnI results.  Refer to the "Links" section for chest pain algorithms and additional  guidance. Performed at [facility], [address] [place], [place] [address]   Troponin I (High Sensitivity)     Status: None   Collection Time: 10/12/18  8:28 PM  Result Value Ref Range   Troponin I (High Sensitivity) 7 <18 ng/L    Comment: (NOTE) Elevated high sensitivity troponin I (hsTnI) values and significant  changes across serial measurements may suggest ACS but many other  chronic and acute conditions are known to elevate hsTnI results.  Refer to the "Links" section for chest pain algorithms and additional  guidance. Performed at [facility], [address] [place], [place] [address]    Dg Chest 2 View  Result Date: 10/12/2018 CLINICAL DATA:  Rib pain EXAM: CHEST - 2 VIEW COMPARISON:  10/04/2018 FINDINGS: Cardiomegaly. No significant change in mild diffuse interstitial pulmonary opacity. No new or focal airspace opacity. The visualized skeletal structures are unremarkable. IMPRESSION: 1. No displaced fracture or other radiographic abnormality of the right ribs to explain pain. 2. Cardiomegaly with mild, diffuse interstitial pulmonary opacity, unchanged from prior examination and likely edema. No new or focal airspace opacity. Electronically Signed   By: Lauralyn Primes M.D.   On: 10/12/2018 14:36   Ct Angio Chest Pe W And/or Wo Contrast  Result Date: 10/12/2018 CLINICAL DATA:  53 year old male with shortness of breath. Concern for pulmonary embolism. EXAM: CT ANGIOGRAPHY CHEST WITH CONTRAST TECHNIQUE: Multidetector CT imaging of the chest was performed using the standard protocol during bolus administration of intravenous contrast. Multiplanar CT image reconstructions and MIPs were obtained to evaluate the vascular  anatomy. CONTRAST:  169mL OMNIPAQUE IOHEXOL 350 MG/ML SOLN COMPARISON:  Chest CT dated 08/24/2016 FINDINGS: Cardiovascular: There is mild cardiomegaly. No pericardial effusion. The thoracic aorta is unremarkable. Evaluation of the pulmonary arteries is somewhat limited due to suboptimal opacification of the peripheral branches. No large or central pulmonary artery embolus identified. Mediastinum/Nodes: There is no hilar or mediastinal adenopathy. The esophagus is grossly unremarkable. No mediastinal fluid collection. Lungs/Pleura: Mild diffuse vascular prominence and hazy airspace density may represent congestion and mild edema. Pneumonia is not excluded. Clinical correlation is recommended. No focal consolidation, pleural effusion, or pneumothorax. The central airways are patent. Upper Abdomen: No acute abnormality. Musculoskeletal: Mild degenerative changes of the spine. No acute osseous pathology. Review of the MIP images confirms the above findings. IMPRESSION: 1. No CT evidence of central pulmonary artery embolus. 2. Mild cardiomegaly with probable mild vascular congestion and edema. Pneumonia is not excluded. Clinical correlation is recommended. Electronically Signed   By: Anner Crete M.D.   On: 10/12/2018 21:47    Pending Labs Unresulted Labs (From admission, onward)    Start     Ordered   10/19/18 0500  Creatinine, serum  (enoxaparin (LOVENOX)    CrCl >/= 30 ml/min)  Weekly,   R    Comments: while on enoxaparin therapy    10/12/18 2345    10/13/18 2831  Basic metabolic panel  Tomorrow morning,   R     10/12/18 2345   10/13/18 0500  CBC  Tomorrow morning,   R     10/12/18 2345   10/12/18 2344  HIV Antibody  (Routine Testing)  Once,   STAT     10/12/18 2345   10/12/18 2344  CBC  (enoxaparin (LOVENOX)    CrCl >/= 30 ml/min)  Once,   STAT    Comments: Baseline for enoxaparin therapy IF NOT ALREADY DRAWN.  Notify MD if PLT < 100 K.    10/12/18 2345   10/12/18 2344  Creatinine, serum  (enoxaparin (LOVENOX)    CrCl >/= 30 ml/min)  Once,   STAT    Comments: Baseline for enoxaparin therapy IF NOT ALREADY DRAWN.    10/12/18 2345          Vitals/Pain Today's Vitals   10/12/18 1845 10/12/18 1915 10/12/18 1930 10/12/18 2130  BP: (!) 136/102 (!) 157/106 (!) 150/76   Pulse: 65 73 68 70  Resp: 17 18 18 14   Temp:      TempSrc:      SpO2: 92% 96% 94% 96%  PainSc:        Isolation Precautions No active isolations  Medications Medications  enoxaparin (LOVENOX) injection 40 mg (has no administration in time range)  acetaminophen (TYLENOL) tablet 650 mg (has no administration in time range)    Or  acetaminophen (TYLENOL) suppository 650 mg (has no administration in time range)  HYDROcodone-acetaminophen (NORCO/VICODIN) 5-325 MG per tablet 1-2 tablet (has no administration in time range)  morphine 2 MG/ML injection 2 mg (has no administration in time range)  iohexol (OMNIPAQUE) 350 MG/ML injection 100 mL (100 mLs Intravenous Contrast Given 10/12/18 2114)    Mobility walks Moderate fall risk   Focused Assessments Pulmonary Assessment Handoff:  Lung sounds:   O2 Device: Room Air        R Recommendations: See Admitting Provider Note  Report given to:   Additional Notes:

## 2018-10-13 NOTE — Plan of Care (Signed)

## 2018-10-13 NOTE — Progress Notes (Signed)
CCMD currently notified this nurse that pt had a nonsustained bradycardia into the 40's around 2am. Pt is resting at this time.

## 2018-10-13 NOTE — Progress Notes (Signed)
  Echocardiogram 2D Echocardiogram has been performed.  Kenneth Gould 10/13/2018, 3:17 PM

## 2018-10-13 NOTE — Progress Notes (Signed)
Patient seen and examined.  Admitted early morning hours by nighttime hospitalist.  53 year old gentleman who has recently moved from [place] area to [place] area.  Apparently, he was here in the past, in 2018 he was diagnosed with systolic CHF.  Patient states that he was on lisinopril since then.  2 weeks ago, he fell on his right chest and started having pain, came to the ER, discharged on tramadol did not relieve so came back.  Assessment and plan: Right chest wall pain: No local lesions. No evidence of complications, no rib fractures, no pneumothorax or PE.  Probably soft tissue injury.  His pain is out of proportion to soft tissue injury happened 2 weeks ago.  Adequate pain medications, incentive spirometry, mobilization, deep breathing exercises.  Suspected CHF on CT of the chest: He is on room air.  Unknown baseline weight.  He does feels bloated.  BNP was 31.  2D echocardiogram pending.  Given 2 doses of IV Lasix with improvement.  Echocardiogram pending.  Continue IV Lasix today.  Intake and output monitoring.  Further management will depend upon 2D echocardiogram findings.  Currently stable. Asymptomatic sinus bradycardia in the monitor, no indication for treatment.  Plan: Expected discharge tomorrow.  Will need referral to outpatient providers on discharge.  He will need new PCP.

## 2018-10-13 NOTE — Progress Notes (Signed)
Patient admitted as a result of fall but is refusing low bed. Will continue to reinforce.

## 2018-10-13 NOTE — Progress Notes (Addendum)
Assume care, pt drowsy d/t pain meds but alert and oriented. Agree with previous Rn's assessment. Pt. Refused low bed.

## 2018-10-13 NOTE — H&P (Signed)
History and Physical    Ahmani Alvarado NZV:[id number] DOB: July 30, 1965 DOA: 10/12/2018  PCP: Unknown Patient coming from: Home  I have personally briefly reviewed patient's old medical records in [facility]  Chief Complaint: worsening right anterior side pain  HPI: Brayam Garsee is a 53 y.o. male with medical history significant of nonischemic cardiomyopathy, chronic systolic CHF, hypertension who presents for worsening right-sided anterior rib pain and increasing shortness of breath. He was recently evaluated in the ED on 9/17 after sustaining a fall while helping someone up the stairs.  He had chest x-ray at that time showing cardiomegaly with vascular congestion and negative for any pneumothorax or pleural effusion.He was given Lasix, lisinopril and tramadol and discharged home. Since returning home, patient endorsed continued worsening right-sided rib pain.  He has been unable to sleep and has had trouble breathing secondary to pain.  Has had minimal relief from tramadol.  Also feels of the Lasix has not been helping and he has not been urinating frequently. Denies any chest pain.  No new fevers or chills.  Denies tobacco, alcohol use but has been using marijuana.  ED Course: Patient was visibly uncomfortable and tearful on exam secondary to right sided rib pain.  CBC shows no leukocytosis or anemia.  And 1.25 elevated from baseline of 1. Chest x-ray showed no displaced fracture or radiological abnormalities of the right breath explain pain.  Continues to show cardiomegaly with mild, diffuse interstitial pulmonary opacity unchanged from prior.  BNP of 31 and troponin of 5 and 7. Negative PE on CT chest but showed vascular congestion.  Review of Systems:  Constitutional: No Weight Change, No Fever ENT/Mouth: No sore throat, No Rhinorrhea Eyes: No Eye Pain, No Vision Changes Cardiovascular: No Chest Pain, + SOB, Respiratory: No Cough, No Sputum, No Wheezing, no Dyspnea   Gastrointestinal: No Nausea, No Vomiting, No Diarrhea, No Constipation, No Pain Genitourinary: no Urinary Incontinence, No Urgency, No Flank Pain Musculoskeletal: No Arthralgias, No Myalgias Skin: No Skin Lesions, No Pruritus, Neuro: no Weakness, No Numbness,  No Loss of Consciousness, No Syncope Psych: No Anxiety/Panic, No Depression, no decrease appetite Heme/Lymph: No Bruising, No Bleeding  Past Medical History:  Diagnosis Date  . Essential hypertension 10/24/2014  . Family history of premature CAD   . NICM (nonischemic cardiomyopathy) (HCC)    a. 10/2014 Cath: nl cors, EF 35-45%, glob HK.  . Obesity (BMI 30-39.9)     Past Surgical History:  Procedure Laterality Date  . CARDIAC CATHETERIZATION N/A 10/25/2014   Procedure: Left Heart Cath and Coronary Angiography;  Surgeon: Lyn Records, MD;  Location: [facility] INVASIVE CV LAB;  Service: Cardiovascular;  Laterality: N/A;  . None       reports that he quit smoking about 3 years ago. His smoking use included cigarettes. He has a 15.00 pack-year smoking history. He has never used smokeless tobacco. He reports current drug use. Drug: Marijuana. He reports that he does not drink alcohol.  Allergies  Allergen Reactions  . Nyquil Multi-Symptom [Pseudoeph-Doxylamine-Dm-Apap] Nausea And Vomiting    Family History  Problem Relation Age of Onset  . Heart attack Sister 23       Died in her sleep, dx at autopsy  . Heart attack Cousin 17       Died in his sleep   Family history reviewed and not pertinent   Prior to Admission medications   Medication Sig Start Date End Date Taking? Authorizing Provider  furosemide (LASIX) 20 MG tablet Take 1  tablet (20 mg total) by mouth daily. 10/05/18  Yes Pollina, Gwenyth Allegra, MD  lisinopril (ZESTRIL) 20 MG tablet Take 1 tablet (20 mg total) by mouth daily. 10/05/18  Yes Pollina, Gwenyth Allegra, MD  naproxen sodium (ALEVE) 220 MG tablet Take 220-440 mg by mouth 2 (two) times daily as needed (for pain).     Yes [provider]  potassium chloride SA (K-DUR) 10 MEQ tablet Take 2 tablets (20 mEq total) by mouth daily. 10/05/18  Yes Pollina, Gwenyth Allegra, MD  traMADol (ULTRAM) 50 MG tablet Take 1 tablet (50 mg total) by mouth every 6 (six) hours as needed. Patient not taking: Reported on 10/12/2018 10/05/18   Orpah Greek, MD  famotidine (PEPCID) 20 MG tablet Take 1 tablet (20 mg total) by mouth 2 (two) times daily. Patient not taking: Reported on 10/05/2018 08/26/16 10/05/18  Geradine Girt, DO  hydrochlorothiazide (MICROZIDE) 12.5 MG capsule Take 1 capsule (12.5 mg total) by mouth daily. Patient not taking: Reported on 10/05/2018 08/27/16 10/05/18  Geradine Girt, DO    Physical Exam: Vitals:   10/12/18 2130 10/13/18 0014 10/13/18 0048 10/13/18 0054  BP:  (!) 158/97  (!) 150/101  Pulse: 70 83  78  Resp: 14   20  Temp:    98.5 F (36.9 C)  TempSrc:    Oral  SpO2: 96% 91%  94%  Weight:   (!) 145.7 kg   Height:   5\' 9"  (1.753 m)     Constitutional: Well appearing but visibly uncomfortable secondary to pain on right side.  Patient had to lay on the left side to be comfortable and was tearful due to pain. Vitals:   10/12/18 2130 10/13/18 0014 10/13/18 0048 10/13/18 0054  BP:  (!) 158/97  (!) 150/101  Pulse: 70 83  78  Resp: 14   20  Temp:    98.5 F (36.9 C)  TempSrc:    Oral  SpO2: 96% 91%  94%  Weight:   (!) 145.7 kg   Height:   5\' 9"  (1.753 m)    Eyes: PERRL, lids and conjunctivae normal ENMT: Mucous membranes are moist. Posterior pharynx clear of any exudate or lesions.Normal dentition.  Neck: normal, supple, no masses Respiratory: clear to auscultation bilaterally, no wheezing, no crackles. Normal respiratory effort on room air. No accessory muscle use.  Cardiovascular: Regular rate and rhythm, no murmurs / rubs / gallops.  Nonpitting bilateral edema above the ankles. Abdomen: Significant tenderness with light palpation of the right lateral rib cage-no noted  bruising, no masses palpated.  Bowel sounds positive.  Musculoskeletal: no clubbing / cyanosis. No joint deformity upper and lower extremities. Good ROM, no contractures. Normal muscle tone.  Skin: no rashes, lesions, ulcers. No induration Neurologic: CN 2-12 grossly intact. Sensation intact, DTR normal. Strength 5/5 in all 4.  Psychiatric: Normal judgment and insight. Alert and oriented x 3. Normal mood.    Labs on Admission: I have personally reviewed following labs and imaging studies  CBC: Recent Labs  Lab 10/12/18 1828  WBC 7.9  NEUTROABS 5.1  HGB 18.7*  HCT 55.5*  MCV 86.0  PLT 161   Basic Metabolic Panel: Recent Labs  Lab 10/12/18 1828  NA 135  K 4.3  CL 99  CO2 27  GLUCOSE 104*  BUN 10  CREATININE 1.25*  CALCIUM 9.1   GFR: Estimated Creatinine Clearance: 98.5 mL/min (A) (by C-G formula based on SCr of 1.25 mg/dL (H)). Liver Function Tests: No results for  input(s): AST, ALT, ALKPHOS, BILITOT, PROT, ALBUMIN in the last 168 hours. No results for input(s): LIPASE, AMYLASE in the last 168 hours. No results for input(s): AMMONIA in the last 168 hours. Coagulation Profile: No results for input(s): INR, PROTIME in the last 168 hours. Cardiac Enzymes: No results for input(s): CKTOTAL, CKMB, CKMBINDEX, TROPONINI in the last 168 hours. BNP (last 3 results) No results for input(s): PROBNP in the last 8760 hours. HbA1C: No results for input(s): HGBA1C in the last 72 hours. CBG: No results for input(s): GLUCAP in the last 168 hours. Lipid Profile: No results for input(s): CHOL, HDL, LDLCALC, TRIG, CHOLHDL, LDLDIRECT in the last 72 hours. Thyroid Function Tests: No results for input(s): TSH, T4TOTAL, FREET4, T3FREE, THYROIDAB in the last 72 hours. Anemia Panel: No results for input(s): VITAMINB12, FOLATE, FERRITIN, TIBC, IRON, RETICCTPCT in the last 72 hours. Urine analysis:    Component Value Date/Time   COLORURINE AMBER (A) 08/24/2016 0151   APPEARANCEUR CLEAR  08/24/2016 0151   LABSPEC 1.028 08/24/2016 0151   PHURINE 5.0 08/24/2016 0151   GLUCOSEU NEGATIVE 08/24/2016 0151   HGBUR NEGATIVE 08/24/2016 0151   BILIRUBINUR NEGATIVE 08/24/2016 0151   KETONESUR NEGATIVE 08/24/2016 0151   PROTEINUR 30 (A) 08/24/2016 0151   NITRITE NEGATIVE 08/24/2016 0151   LEUKOCYTESUR NEGATIVE 08/24/2016 0151    Radiological Exams on Admission: Dg Chest 2 View  Result Date: 10/12/2018 CLINICAL DATA:  Rib pain EXAM: CHEST - 2 VIEW COMPARISON:  10/04/2018 FINDINGS: Cardiomegaly. No significant change in mild diffuse interstitial pulmonary opacity. No new or focal airspace opacity. The visualized skeletal structures are unremarkable. IMPRESSION: 1. No displaced fracture or other radiographic abnormality of the right ribs to explain pain. 2. Cardiomegaly with mild, diffuse interstitial pulmonary opacity, unchanged from prior examination and likely edema. No new or focal airspace opacity. Electronically Signed   By: Lauralyn Primes M.D.   On: 10/12/2018 14:36   Ct Angio Chest Pe W And/or Wo Contrast  Result Date: 10/12/2018 CLINICAL DATA:  52 year old male with shortness of breath. Concern for pulmonary embolism. EXAM: CT ANGIOGRAPHY CHEST WITH CONTRAST TECHNIQUE: Multidetector CT imaging of the chest was performed using the standard protocol during bolus administration of intravenous contrast. Multiplanar CT image reconstructions and MIPs were obtained to evaluate the vascular anatomy. CONTRAST:  OMNIPAQUE IOHEXOL 350 MG/ML SOLN COMPARISON:  Chest CT dated 08/24/2016 FINDINGS: Cardiovascular: There is mild cardiomegaly. No pericardial effusion. The thoracic aorta is unremarkable. Evaluation of the pulmonary arteries is somewhat limited due to suboptimal opacification of the peripheral branches. No large or central pulmonary artery embolus identified. Mediastinum/Nodes: There is no hilar or mediastinal adenopathy. The esophagus is grossly unremarkable. No mediastinal fluid  collection. Lungs/Pleura: Mild diffuse vascular prominence and hazy airspace density may represent congestion and mild edema. Pneumonia is not excluded. Clinical correlation is recommended. No focal consolidation, pleural effusion, or pneumothorax. The central airways are patent. Upper Abdomen: No acute abnormality. Musculoskeletal: Mild degenerative changes of the spine. No acute osseous pathology. Review of the MIP images confirms the above findings. IMPRESSION: 1. No CT evidence of central pulmonary artery embolus. 2. Mild cardiomegaly with probable mild vascular congestion and edema. Pneumonia is not excluded. Clinical correlation is recommended. Electronically Signed   By: Elgie Collard M.D.   On: 10/12/2018 21:47    EKG: Independently reviewed.   Assessment/Plan  Worsening right-sided rib pain -Patient recently sustained a fall and landed on concrete stair steps.  Chest x-ray does not show any fractures dislocation.  Pain  likely secondary to contusion -Acetaminophen for mild pain, hydrocodone for moderate pain in morphine as needed for severe pain  Congestive heart failure exacerbation -Patient's symptoms of shortness of breath likely secondary to both right-sided rib pain and vascular congestion - Last echo in August 2018 showed EF 30-35 with severe diffuse hypokinesis. -We will repeat echo -Daily IV 40 mg Lasix -Monitor intake and output.  Monitor daily weights. -Patient currently does not follow with cardiology and will need to have outpatient follow-up  AKI -Likely prerenal -We will hold lisinopril    DVT prophylaxis: Lovenox  Code Status: Full  Family Communication: Plan discussed with patient at bedside  Disposition Plan: Home with at least 2 midnights secondary to worsening pain and CHF exacerbation  Consults called:  Admission status: Inpatient    Zemira Zehring T Waylynn Benefiel DO [facility]   If 7PM-7AM, please contact night-coverage [email or web address] Password [email or web address]   10/13/2018, 2:21 AM

## 2018-10-14 DIAGNOSIS — R079 Chest pain, unspecified: Secondary | ICD-10-CM

## 2018-10-14 MED ORDER — LIDOCAINE 5 % EX PTCH: 1.0000 | MEDICATED_PATCH | CUTANEOUS | 0 refills | Status: DC

## 2018-10-14 NOTE — Discharge Summary (Signed)
Physician Discharge Summary  Patient ID: Kenneth Gould MRN: [id number] DOB/AGE: [birth year] 53 y.o.  Admit date: 10/12/2018 Discharge date: 10/14/2018  Admission Diagnoses:  Discharge Diagnoses:  Active Problems: Diastolic congestive heart failure, chronic.  Right-sided chest pain secondary to trauma  Discharged Condition: stable  Hospital Course: Patient is a 53 year old African-American male with past medical history significant for hypertension and cardiomyopathy/congestive heart failure, with prior echocardiogram done in August 2020 revealing an EF of 30% to 35%, severe diffuse hypokinesis with distinct regional wall motion abnormalities. There is akinesis of the basal-midinferolateral myocardium. Features are consistent with a pseudonormal left ventricular filling pattern, with concomitant abnormal relaxation and increased filling pressure (grade 2 diastolic dysfunction).  Patient was admitted with right-sided chest pain following a trauma.  Apparently, patient was trying to assist someone and hurt his ribs in the process.  Patient was admitted for further assessment and management.  Cardiac enzymes came back negative.  Repeat echocardiogram revealed EF of 50 to 55%, impaired relaxation of diastolic filling and left ventricle inferior wall hypokinesis.  Right sided chest chest pain was thought to be musculoskeletal.  Patient was managed with adequate analgesics.  Patient's pain has been optimized.  Patient be discharged back home to the care of the primary care provider.  Consults: None  Significant Diagnostic Studies: Cardiac enzymes came back negative  Treatments: Adequate analgesia.  Discharge Exam: Blood pressure 123/60, pulse 60, temperature 98.4 F (36.9 C), temperature source Oral, resp. rate 20, height 5\' 9"  (1.753 m), weight (!) 145.7 kg, SpO2 94 %.   Disposition: Discharge disposition: 01-Home or Self Care  Discharge Instructions    Diet - low sodium heart healthy    Complete by: As directed    Increase activity slowly   Complete by: As directed      Allergies as of 10/14/2018      Reactions   Nyquil Multi-symptom [pseudoeph-doxylamine-dm-apap] Nausea And Vomiting      Medication List    STOP taking these medications   naproxen sodium 220 MG tablet Commonly known as: ALEVE     TAKE these medications   furosemide 20 MG tablet Commonly known as: LASIX Take 1 tablet (20 mg total) by mouth daily.   lidocaine 5 % Commonly known as: LIDODERM Place 1 patch onto the skin daily. Remove & Discard patch within 12 hours or as directed by MD Start taking on: October 15, 2018   lisinopril 20 MG tablet Commonly known as: ZESTRIL Take 1 tablet (20 mg total) by mouth daily.   potassium chloride 10 MEQ tablet Commonly known as: K-DUR Take 2 tablets (20 mEq total) by mouth daily.   traMADol 50 MG tablet Commonly known as: ULTRAM Take 1 tablet (50 mg total) by mouth every 6 (six) hours as needed.        SignedBonnell Public 10/14/2018, 1:40 PM

## 2018-10-14 NOTE — Progress Notes (Signed)
discharge instructions given to patient.

## 2018-11-06 ENCOUNTER — Other Ambulatory Visit: Payer: Self-pay

## 2018-11-06 DIAGNOSIS — I5022 Chronic systolic (congestive) heart failure: Secondary | ICD-10-CM | POA: Diagnosis not present

## 2018-11-06 DIAGNOSIS — R05 Cough: Secondary | ICD-10-CM | POA: Insufficient documentation

## 2018-11-06 DIAGNOSIS — Z20828 Contact with and (suspected) exposure to other viral communicable diseases: Secondary | ICD-10-CM | POA: Insufficient documentation

## 2018-11-06 DIAGNOSIS — I110 Hypertensive heart disease with heart failure: Secondary | ICD-10-CM | POA: Diagnosis not present

## 2018-11-06 DIAGNOSIS — R0602 Shortness of breath: Secondary | ICD-10-CM | POA: Diagnosis present

## 2018-11-06 DIAGNOSIS — Z87891 Personal history of nicotine dependence: Secondary | ICD-10-CM | POA: Insufficient documentation

## 2018-11-06 DIAGNOSIS — R531 Weakness: Secondary | ICD-10-CM | POA: Insufficient documentation

## 2018-11-06 DIAGNOSIS — Z79899 Other long term (current) drug therapy: Secondary | ICD-10-CM | POA: Insufficient documentation

## 2018-11-06 DIAGNOSIS — R0600 Dyspnea, unspecified: Secondary | ICD-10-CM | POA: Insufficient documentation

## 2018-11-06 DIAGNOSIS — R55 Syncope and collapse: Secondary | ICD-10-CM | POA: Diagnosis not present

## 2018-11-07 ENCOUNTER — Emergency Department (HOSPITAL_COMMUNITY)
Admission: EM | Admit: 2018-11-07 | Discharge: 2018-11-07 | Disposition: A | Discharge status: Home / Self Care | Payer: Medicaid - Out of State | Attending: Emergency Medicine | Admitting: Emergency Medicine

## 2018-11-07 ENCOUNTER — Encounter (HOSPITAL_COMMUNITY): Payer: Self-pay | Admitting: Emergency Medicine

## 2018-11-07 ENCOUNTER — Other Ambulatory Visit: Payer: Self-pay

## 2018-11-07 ENCOUNTER — Emergency Department (HOSPITAL_COMMUNITY): Payer: Medicaid - Out of State

## 2018-11-07 DIAGNOSIS — R0600 Dyspnea, unspecified: Secondary | ICD-10-CM

## 2018-11-07 DIAGNOSIS — I509 Heart failure, unspecified: Secondary | ICD-10-CM

## 2018-11-07 LAB — CBC WITH DIFFERENTIAL/PLATELET
Abs Immature Granulocytes: 0.02 10*3/uL (ref 0.00–0.07)
Basophils Absolute: 0.1 10*3/uL (ref 0.0–0.1)
Basophils Relative: 1 %
Eosinophils Absolute: 0.2 10*3/uL (ref 0.0–0.5)
Eosinophils Relative: 3 %
HCT: 51.9 % (ref 39.0–52.0)
Hemoglobin: 16.6 g/dL (ref 13.0–17.0)
Immature Granulocytes: 0 %
Lymphocytes Relative: 31 %
Lymphs Abs: 2.5 10*3/uL (ref 0.7–4.0)
MCH: 27.9 pg (ref 26.0–34.0)
MCHC: 32 g/dL (ref 30.0–36.0)
MCV: 87.1 fL (ref 80.0–100.0)
Monocytes Absolute: 0.7 10*3/uL (ref 0.1–1.0)
Monocytes Relative: 8 %
Neutro Abs: 4.7 10*3/uL (ref 1.7–7.7)
Neutrophils Relative %: 57 %
Platelets: 196 10*3/uL (ref 150–400)
RBC: 5.96 MIL/uL — ABNORMAL HIGH (ref 4.22–5.81)
RDW: 14.4 % (ref 11.5–15.5)
WBC: 8.2 10*3/uL (ref 4.0–10.5)
nRBC: 0 % (ref 0.0–0.2)

## 2018-11-07 LAB — TROPONIN I (HIGH SENSITIVITY)
Troponin I (High Sensitivity): 10 ng/L (ref <18)
Troponin I (High Sensitivity): 11 ng/L (ref <18)

## 2018-11-07 LAB — COMPREHENSIVE METABOLIC PANEL
ALT: 19 U/L (ref 0–44)
AST: 21 U/L (ref 15–41)
Albumin: 3.7 g/dL (ref 3.5–5.0)
Alkaline Phosphatase: 99 U/L (ref 38–126)
Anion gap: 8 (ref 5–15)
BUN: 10 mg/dL (ref 6–20)
CO2: 26 mmol/L (ref 22–32)
Calcium: 8.8 mg/dL — ABNORMAL LOW (ref 8.9–10.3)
Chloride: 102 mmol/L (ref 98–111)
Creatinine, Ser: 1.09 mg/dL (ref 0.61–1.24)
GFR calc Af Amer: >60 mL/min (ref >60)
GFR calc non Af Amer: >60 mL/min (ref >60)
Glucose, Bld: 121 mg/dL — ABNORMAL HIGH (ref 70–99)
Potassium: 4.1 mmol/L (ref 3.5–5.1)
Sodium: 136 mmol/L (ref 135–145)
Total Bilirubin: 0.8 mg/dL (ref 0.3–1.2)
Total Protein: 7.7 g/dL (ref 6.5–8.1)

## 2018-11-07 LAB — BRAIN NATRIURETIC PEPTIDE: B Natriuretic Peptide: 36.2 pg/mL (ref 0.0–100.0)

## 2018-11-07 LAB — SARS CORONAVIRUS 2 (TAT 6-24 HRS): SARS Coronavirus 2: NEGATIVE

## 2018-11-07 MED ORDER — IOHEXOL 350 MG/ML SOLN
100.0000 mL | Freq: Once | INTRAVENOUS | Status: AC | PRN
  Administered 2018-11-07: 06:00:00 100 mL via INTRAVENOUS

## 2018-11-07 MED ORDER — ALBUTEROL SULFATE HFA 108 (90 BASE) MCG/ACT IN AERS
6.0000 | INHALATION_SPRAY | Freq: Once | RESPIRATORY_TRACT | Status: AC
  Administered 2018-11-07: 02:00:00 6 via RESPIRATORY_TRACT
  Filled 2018-11-07: qty 6.7

## 2018-11-07 MED ORDER — SODIUM CHLORIDE (PF) 0.9 % IJ SOLN
INTRAMUSCULAR | Status: AC
  Filled 2018-11-07: qty 50

## 2018-11-07 NOTE — ED Provider Notes (Signed)
[facility] DEPT Provider Note   CSN: [id number] Arrival date & time: 11/06/18  2337     History   Chief Complaint Chief Complaint  Patient presents with  . Shortness of Breath  . Loss of Consciousness    HPI Kenneth Gould is a 53 y.o. male.     Patient is a 53 year old male with past medical history of nonischemic cardiomyopathy, CHF, hypertension, and obesity.  He presents today for evaluation of feeling short of breath and cough.  Today he began feeling weak and presents for evaluation.  He denies any fevers or chills.  He denies chest pain.  The history is provided by the patient.  Shortness of Breath Severity:  Moderate Onset quality:  Sudden Duration:  3 days Timing:  Constant Progression:  Worsening Chronicity:  New Context: activity   Relieved by:  Nothing Worsened by:  Nothing Ineffective treatments:  None tried Associated symptoms: syncope   Loss of Consciousness Associated symptoms: shortness of breath     Past Medical History:  Diagnosis Date  . Essential hypertension 10/24/2014  . Family history of premature CAD   . NICM (nonischemic cardiomyopathy) ([place])    a. 10/2014 Cath: nl cors, EF 35-45%, glob HK.  . Obesity (BMI 30-39.9)     Patient Active Problem List   Diagnosis Date Noted  . Congestive heart failure (CHF) ([place]) 10/12/2018  . Atypical chest pain 08/24/2016  . Chronic systolic CHF (congestive heart failure) ([place])   . Chest pain   . Unstable angina pectoris ([place]) 10/25/2014  . NICM (nonischemic cardiomyopathy) ([place]) 10/25/2014  . Chest pain with moderate risk for cardiac etiology 10/24/2014  . Essential hypertension 10/24/2014  . Chest pain, moderate coronary artery risk 10/24/2014    Past Surgical History:  Procedure Laterality Date  . CARDIAC CATHETERIZATION N/A 10/25/2014   Procedure: Left Heart Cath and Coronary Angiography;  Surgeon: Belva Crome, MD;  Location: [facility];  Service:  Cardiovascular;  Laterality: N/A;  . None          Home Medications    Prior to Admission medications   Medication Sig Start Date End Date Taking? Authorizing Provider  lidocaine (LIDODERM) 5 % Place 1 patch onto the skin daily. Remove & Discard patch within 12 hours or as directed by MD 10/15/18  Yes Bonnell Public, MD  lisinopril (ZESTRIL) 20 MG tablet Take 1 tablet (20 mg total) by mouth daily. 10/05/18  Yes Pollina, Gwenyth Allegra, MD  furosemide (LASIX) 20 MG tablet Take 1 tablet (20 mg total) by mouth daily. Patient not taking: Reported on 11/07/2018 10/05/18   Orpah Greek, MD  potassium chloride SA (K-DUR) 10 MEQ tablet Take 2 tablets (20 mEq total) by mouth daily. Patient not taking: Reported on 11/07/2018 10/05/18   Orpah Greek, MD  traMADol (ULTRAM) 50 MG tablet Take 1 tablet (50 mg total) by mouth every 6 (six) hours as needed. Patient not taking: Reported on 10/12/2018 10/05/18   Orpah Greek, MD  famotidine (PEPCID) 20 MG tablet Take 1 tablet (20 mg total) by mouth 2 (two) times daily. Patient not taking: Reported on 10/05/2018 08/26/16 10/05/18  Geradine Girt, DO  hydrochlorothiazide (MICROZIDE) 12.5 MG capsule Take 1 capsule (12.5 mg total) by mouth daily. Patient not taking: Reported on 10/05/2018 08/27/16 10/05/18  Geradine Girt, DO    Family History Family History  Problem Relation Age of Onset  . Heart attack Sister 6  Died in her sleep, dx at autopsy  . Heart attack Cousin 28       Died in his sleep    Social History Social History   Tobacco Use  . Smoking status: Former Smoker    Packs/day: 0.50    Years: 30.00    Pack years: 15.00    Types: Cigarettes    Quit date: 08/25/2015    Years since quitting: 3.2  . Smokeless tobacco: Never Used  Substance Use Topics  . Alcohol use: No  . Drug use: Yes    Types: Marijuana    Comment: THC     Allergies   Nyquil multi-symptom [pseudoeph-doxylamine-dm-apap]   Review of  Systems Review of Systems  Respiratory: Positive for shortness of breath.   Cardiovascular: Positive for syncope.  All other systems reviewed and are negative.    Physical Exam Updated Vital Signs BP 121/76   Pulse 77   Temp 98.4 F (36.9 C) (Oral)   Resp (!) 22   Ht 5\' 9"  (1.753 m)   Wt (!) 138.3 kg   SpO2 96%   BMI 45.04 kg/m   Physical Exam Vitals signs and nursing note reviewed.  Constitutional:      General: He is not in acute distress.    Appearance: He is well-developed. He is not diaphoretic.  HENT:     Head: Normocephalic and atraumatic.  Neck:     Musculoskeletal: Normal range of motion and neck supple.  Cardiovascular:     Rate and Rhythm: Normal rate and regular rhythm.     Heart sounds: No murmur. No friction rub.  Pulmonary:     Effort: Pulmonary effort is normal. No respiratory distress.     Breath sounds: Normal breath sounds. No wheezing or rales.  Abdominal:     General: Bowel sounds are normal. There is no distension.     Palpations: Abdomen is soft.     Tenderness: There is no abdominal tenderness.  Musculoskeletal: Normal range of motion.     Right lower leg: He exhibits no tenderness. No edema.     Left lower leg: He exhibits no tenderness. No edema.  Skin:    General: Skin is warm and dry.  Neurological:     Mental Status: He is alert and oriented to person, place, and time.     Coordination: Coordination normal.      ED Treatments / Results  Labs (all labs ordered are listed, but only abnormal results are displayed) Labs Reviewed  SARS CORONAVIRUS 2 (TAT 6-24 HRS)  COMPREHENSIVE METABOLIC PANEL  CBC WITH DIFFERENTIAL/PLATELET    EKG EKG Interpretation  Date/Time:  Tuesday November 07 2018 00:03:46 EDT Ventricular Rate:  81 PR Interval:    QRS Duration: 107 QT Interval:  398 QTC Calculation: 462 R Axis:   -67 Text Interpretation:  Sinus rhythm Multiple ventricular premature complexes Inferior infarct, old Anterior infarct,  old Confirmed by Geoffery Lyons ([id number]) on 11/07/2018 12:22:55 AM   Radiology Dg Chest 2 View  Result Date: 11/07/2018 CLINICAL DATA:  Shortness of breath EXAM: CHEST - 2 VIEW COMPARISON:  October 12, 2018 FINDINGS: There is mild cardiomegaly. Increased hazy airspace opacity seen at the bilateral lower lungs. No large airspace consolidation or pleural effusion. Pulmonary vascular congestion is seen. IMPRESSION: Mild cardiomegaly. Pulmonary vascular congestion. Nonspecific mildly increased hazy airspace opacity at both lung bases. This could be due to atelectasis, edema, and/or early infectious etiology. Electronically Signed   By: Heywood Bene.D.  On: 11/07/2018 00:34    Procedures Procedures (including critical care time)  Medications Ordered in ED Medications  albuterol (VENTOLIN HFA) 108 (90 Base) MCG/ACT inhaler 6 puff (has no administration in time range)     Initial Impression / Assessment and Plan / ED Course  I have reviewed the triage vital signs and the nursing notes.  Pertinent labs & imaging results that were available during my care of the patient were reviewed by me and considered in my medical decision making (see chart for details).  Patient presenting here with complaints of shortness of breath and weakness.  This has been worsening for the past several days.  He denies any fevers, chills, or cough.  His work-up shows findings consistent with congestive heart failure.  Patient takes Lasix 20 mg daily.  I will have him increase this to 40 mg daily for the next week and follow-up with his primary doctor.  Patient has episodes of hypoxia with saturations dropping into the 80s.  This occurs when he is sleeping, then promptly goes to the upper 90s when stimulated.  I suspect the patient has an underlying component of sleep apnea.  He is to follow-up with his primary doctor about this.  Final Clinical Impressions(s) / ED Diagnoses   Final diagnoses:  None    ED  Discharge Orders    None       Geoffery Lyons, MD 11/07/18 [phone]

## 2018-11-07 NOTE — ED Triage Notes (Signed)
Patient states this started three days ago. Patient states he does not feel good and is having sob. Patient will not stay awake enough to get a good hx.

## 2018-11-07 NOTE — Discharge Instructions (Addendum)
Increase your Lasix to 40 mg once daily for the next week.  Follow-up with your primary doctor in the next 3 to 4 days, and return to the ER if symptoms significantly worsen or change.

## 2018-11-07 NOTE — ED Notes (Signed)
Pt able to stand at bedside and [place] urinal without assistance

## 2019-03-19 ENCOUNTER — Emergency Department (HOSPITAL_COMMUNITY): Payer: Medicaid - Out of State

## 2019-03-19 ENCOUNTER — Emergency Department (HOSPITAL_COMMUNITY)
Admission: EM | Admit: 2019-03-19 | Discharge: 2019-03-19 | Disposition: A | Discharge status: Home / Self Care | Payer: Medicaid - Out of State | Attending: Emergency Medicine | Admitting: Emergency Medicine

## 2019-03-19 ENCOUNTER — Other Ambulatory Visit: Payer: Self-pay

## 2019-03-19 DIAGNOSIS — R42 Dizziness and giddiness: Secondary | ICD-10-CM | POA: Insufficient documentation

## 2019-03-19 DIAGNOSIS — I5022 Chronic systolic (congestive) heart failure: Secondary | ICD-10-CM | POA: Diagnosis not present

## 2019-03-19 DIAGNOSIS — Z20822 Contact with and (suspected) exposure to covid-19: Secondary | ICD-10-CM | POA: Insufficient documentation

## 2019-03-19 DIAGNOSIS — N433 Hydrocele, unspecified: Secondary | ICD-10-CM | POA: Insufficient documentation

## 2019-03-19 DIAGNOSIS — Z87891 Personal history of nicotine dependence: Secondary | ICD-10-CM | POA: Insufficient documentation

## 2019-03-19 DIAGNOSIS — I110 Hypertensive heart disease with heart failure: Secondary | ICD-10-CM | POA: Insufficient documentation

## 2019-03-19 DIAGNOSIS — Z79899 Other long term (current) drug therapy: Secondary | ICD-10-CM | POA: Diagnosis not present

## 2019-03-19 LAB — CBC WITH DIFFERENTIAL/PLATELET
Abs Immature Granulocytes: 0.02 10*3/uL (ref 0.00–0.07)
Basophils Absolute: 0 10*3/uL (ref 0.0–0.1)
Basophils Relative: 1 %
Eosinophils Absolute: 0 10*3/uL (ref 0.0–0.5)
Eosinophils Relative: 1 %
HCT: 55.3 % — ABNORMAL HIGH (ref 39.0–52.0)
Hemoglobin: 18.1 g/dL — ABNORMAL HIGH (ref 13.0–17.0)
Immature Granulocytes: 0 %
Lymphocytes Relative: 27 %
Lymphs Abs: 1.7 10*3/uL (ref 0.7–4.0)
MCH: 27.7 pg (ref 26.0–34.0)
MCHC: 32.7 g/dL (ref 30.0–36.0)
MCV: 84.7 fL (ref 80.0–100.0)
Monocytes Absolute: 0.5 10*3/uL (ref 0.1–1.0)
Monocytes Relative: 7 %
Neutro Abs: 4.1 10*3/uL (ref 1.7–7.7)
Neutrophils Relative %: 64 %
Platelets: 203 10*3/uL (ref 150–400)
RBC: 6.53 MIL/uL — ABNORMAL HIGH (ref 4.22–5.81)
RDW: 14.6 % (ref 11.5–15.5)
WBC: 6.3 10*3/uL (ref 4.0–10.5)
nRBC: 0 % (ref 0.0–0.2)

## 2019-03-19 LAB — COMPREHENSIVE METABOLIC PANEL
ALT: 17 U/L (ref 0–44)
AST: 24 U/L (ref 15–41)
Albumin: 4 g/dL (ref 3.5–5.0)
Alkaline Phosphatase: 97 U/L (ref 38–126)
Anion gap: 12 (ref 5–15)
BUN: 15 mg/dL (ref 6–20)
CO2: 24 mmol/L (ref 22–32)
Calcium: 9.1 mg/dL (ref 8.9–10.3)
Chloride: 101 mmol/L (ref 98–111)
Creatinine, Ser: 1.46 mg/dL — ABNORMAL HIGH (ref 0.61–1.24)
GFR calc Af Amer: >60 mL/min (ref >60)
GFR calc non Af Amer: 55 mL/min — ABNORMAL LOW (ref >60)
Glucose, Bld: 115 mg/dL — ABNORMAL HIGH (ref 70–99)
Potassium: 4.1 mmol/L (ref 3.5–5.1)
Sodium: 137 mmol/L (ref 135–145)
Total Bilirubin: 1 mg/dL (ref 0.3–1.2)
Total Protein: 7.5 g/dL (ref 6.5–8.1)

## 2019-03-19 LAB — POC SARS CORONAVIRUS 2 AG -  ED: SARS Coronavirus 2 Ag: NEGATIVE

## 2019-03-19 LAB — LACTIC ACID, PLASMA: Lactic Acid, Venous: 0.8 mmol/L (ref 0.5–1.9)

## 2019-03-19 LAB — TROPONIN I (HIGH SENSITIVITY): Troponin I (High Sensitivity): <2 ng/L (ref <18)

## 2019-03-19 LAB — RAPID URINE DRUG SCREEN, HOSP PERFORMED
Amphetamines: NOT DETECTED
Barbiturates: NOT DETECTED
Benzodiazepines: NOT DETECTED
Cocaine: NOT DETECTED
Opiates: NOT DETECTED
Tetrahydrocannabinol: POSITIVE — AB

## 2019-03-19 LAB — CK: Total CK: 391 U/L (ref 49–397)

## 2019-03-19 MED ORDER — ASPIRIN 81 MG PO CHEW: 81.0000 mg | CHEWABLE_TABLET | Freq: Every day | ORAL | 0 refills | Status: AC

## 2019-03-19 MED ORDER — FENTANYL CITRATE (PF) 100 MCG/2ML IJ SOLN
50.0000 ug | Freq: Once | INTRAMUSCULAR | Status: AC
  Administered 2019-03-19: 14:00:00 50 ug via INTRAVENOUS
  Filled 2019-03-19: qty 2

## 2019-03-19 MED ORDER — ONDANSETRON HCL 4 MG/2ML IJ SOLN
4.0000 mg | Freq: Once | INTRAMUSCULAR | Status: AC
  Administered 2019-03-19: 14:00:00 4 mg via INTRAVENOUS
  Filled 2019-03-19: qty 2

## 2019-03-19 MED ORDER — ACETAMINOPHEN 325 MG PO TABS
650.0000 mg | ORAL_TABLET | Freq: Once | ORAL | Status: AC
  Administered 2019-03-19: 14:00:00 650 mg via ORAL
  Filled 2019-03-19: qty 2

## 2019-03-19 MED ORDER — LACTATED RINGERS IV BOLUS
1000.0000 mL | Freq: Once | INTRAVENOUS | Status: AC
  Administered 2019-03-19: 19:00:00 1000 mL via INTRAVENOUS

## 2019-03-19 NOTE — ED Provider Notes (Signed)
[facility] EMERGENCY DEPARTMENT Provider Note   CSN: [id number] Arrival date & time: 03/19/19  1228     History Chief Complaint  Patient presents with  . Shortness of Breath  . Generalized Body Aches    Kenneth Gould is a 54 y.o. male with a pmh of obesity, htn, NICM, and obesity who presents with a cc of "pain everywhere." the patient states that for the past 3 days he has had some dizziness and balance issues only when standing.  This morning he was getting to ready to "work out with my roommate."  Patient states that all of a sudden he began feeling short of breath and had pain from head to toe.  Patient is unable to qualify the pain except for that it feels like everything is cramped up.  He states that the pain is severe.  He complains that his skin hurts to touch.  He is feeling short of breath.  He states "I cannot move my body.".  Patient states that he was not even able to walk to get to the ER and had to call EMS however he is able to wiggle his feet and lift them off the bed when asked.  Patient also notes that his left testicle has been swollen for some time and he is worried it might be related to the reason he is having the symptoms today but is unsure.  He denies penile discharge.  He states that he told his doctor about it but it was not evaluated.  He states that it is heavy and painful.  Patient denies unilateral leg swelling, hemoptysis, history of DVT or PE.  Patient denies bilateral leg swelling, weight gain or fluid overload.  He denies Covid exposure or fever.  He denies abdominal pain nausea or vomiting  HPI     Past Medical History:  Diagnosis Date  . Essential hypertension 10/24/2014  . Family history of premature CAD   . NICM (nonischemic cardiomyopathy) ([facility])    a. 10/2014 Cath: nl cors, EF 35-45%, glob HK.  . Obesity (BMI 30-39.9)     Patient Active Problem List   Diagnosis Date Noted  . Congestive heart failure (CHF) ([facility]) 10/12/2018    . Atypical chest pain 08/24/2016  . Chronic systolic CHF (congestive heart failure) ([facility])   . Chest pain   . Unstable angina pectoris ([facility]) 10/25/2014  . NICM (nonischemic cardiomyopathy) ([facility]) 10/25/2014  . Chest pain with moderate risk for cardiac etiology 10/24/2014  . Essential hypertension 10/24/2014  . Chest pain, moderate coronary artery risk 10/24/2014    Past Surgical History:  Procedure Laterality Date  . CARDIAC CATHETERIZATION N/A 10/25/2014   Procedure: Left Heart Cath and Coronary Angiography;  Surgeon: Lyn Records, MD;  Location: [facility] INVASIVE CV LAB;  Service: Cardiovascular;  Laterality: N/A;  . None         Family History  Problem Relation Age of Onset  . Heart attack Sister 63       Died in her sleep, dx at autopsy  . Heart attack Cousin 2       Died in his sleep    Social History   Tobacco Use  . Smoking status: Former Smoker    Packs/day: 0.50    Years: 30.00    Pack years: 15.00    Types: Cigarettes    Quit date: 08/25/2015    Years since quitting: 3.5  . Smokeless tobacco: Never Used  Substance Use Topics  . Alcohol  use: No  . Drug use: Yes    Types: Marijuana    Comment: THC    Home Medications Prior to Admission medications   Medication Sig Start Date End Date Taking? Authorizing Provider  furosemide (LASIX) 20 MG tablet Take 1 tablet (20 mg total) by mouth daily. Patient not taking: Reported on 11/07/2018 10/05/18   Orpah Greek, MD  lidocaine (LIDODERM) 5 % Place 1 patch onto the skin daily. Remove & Discard patch within 12 hours or as directed by MD 10/15/18   Dana Allan I, MD  lisinopril (ZESTRIL) 20 MG tablet Take 1 tablet (20 mg total) by mouth daily. 10/05/18   Pollina, Gwenyth Allegra, MD  potassium chloride SA (K-DUR) 10 MEQ tablet Take 2 tablets (20 mEq total) by mouth daily. Patient not taking: Reported on 11/07/2018 10/05/18   Orpah Greek, MD  traMADol (ULTRAM) 50 MG tablet Take 1 tablet (50 mg total) by  mouth every 6 (six) hours as needed. Patient not taking: Reported on 10/12/2018 10/05/18   Orpah Greek, MD  famotidine (PEPCID) 20 MG tablet Take 1 tablet (20 mg total) by mouth 2 (two) times daily. Patient not taking: Reported on 10/05/2018 08/26/16 10/05/18  Geradine Girt, DO  hydrochlorothiazide (MICROZIDE) 12.5 MG capsule Take 1 capsule (12.5 mg total) by mouth daily. Patient not taking: Reported on 10/05/2018 08/27/16 10/05/18  Geradine Girt, DO    Allergies    Nyquil multi-symptom [pseudoeph-doxylamine-dm-apap]  Review of Systems   Review of Systems Ten systems reviewed and are negative for acute change, except as noted in the HPI.   Physical Exam Updated Vital Signs BP 110/80 (BP Location: Right Arm)   Pulse 68   Temp 98.5 F (36.9 C) (Oral)   Resp (!) 22   Ht 5\' 9"  (1.753 m)   Wt 124.7 kg   SpO2 98%   BMI 40.61 kg/m   Physical Exam Vitals and nursing note reviewed.  Constitutional:      General: He is not in acute distress.    Appearance: He is well-developed. He is not diaphoretic.  HENT:     Head: Normocephalic and atraumatic.  Eyes:     General: No scleral icterus.    Extraocular Movements: Extraocular movements intact.     Conjunctiva/sclera: Conjunctivae normal.     Pupils: Pupils are equal, round, and reactive to light.  Cardiovascular:     Rate and Rhythm: Normal rate and regular rhythm.     Heart sounds: Normal heart sounds.  Pulmonary:     Effort: Pulmonary effort is normal. No respiratory distress.     Breath sounds: Normal breath sounds.     Comments: Hyperventilating  Abdominal:     Palpations: Abdomen is soft.     Tenderness: There is no abdominal tenderness.  Genitourinary:    Comments: Patient with large testicles bilaterally The left testicle is markedly edematous, enlarged, only mildly tender to palpation, and swelling is palpable up to the top of the spermatic cord.  No palpable hernia, normal male anatomy without evidence of  discharge. Musculoskeletal:     Cervical back: Normal range of motion and neck supple.  Skin:    General: Skin is warm and dry.  Neurological:     Mental Status: He is alert.  Psychiatric:        Behavior: Behavior normal.     ED Results / Procedures / Treatments   Labs (all labs ordered are listed, but only abnormal results are displayed) Labs  Reviewed - No data to display  EKG None  Radiology No results found.  Procedures Procedures (including critical care time)  Medications Ordered in ED Medications - No data to display  ED Course  I have reviewed the triage vital signs and the nursing notes.  Pertinent labs & imaging results that were available during my care of the patient were reviewed by me and considered in my medical decision making (see chart for details).    MDM Rules/Calculators/A&P                      54 year old male here with bizarre global symptoms, pain to palpation ofthe skin almost like a complex regional pain syndrome.  This may reflect histrionic behavior.  I reviewed the patient's labs which showed negative point-of-care Covid test, normal lactic acid, normal CK level.  CBC shows a hemoglobin of 18.1 appears to be baseline for the patient.  No evidence of leukocytosis.  CMP shows slightly elevated creatinine at 1.46.  I personally reviewed the patient's 1 view chest x-ray which shows some pulmonary edema.  EKG shows sinus bradycardia at a rate of 58 without evidence of ischemia.  Patient's ultrasound of the scrotum is pending.  Have given signout to Dr. Durene Cal who will assume care of the patient.He has been given Tylenol for body aches and fentanyl for his excruciating global pain. Final Clinical Impression(s) / ED Diagnoses Final diagnoses:  None    Rx / DC Orders ED Discharge Orders    None       Arthor Captain, PA-C 03/19/19 1706    Raeford Razor, MD 03/20/19 [phone]

## 2019-03-19 NOTE — ED Notes (Signed)
Pt back from MRI without incidence

## 2019-03-19 NOTE — Discharge Instructions (Signed)
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  You need to start taking aspirin 81mg  daily and follow up with neurology.  They should call you in the next few days to set up an appointment.

## 2019-03-19 NOTE — ED Triage Notes (Signed)
Pt arrives from home via EMS with complaints of sudden onset of SOB and body aches. Pt was at home when around 11am he states he could not catch his breath and he became dizzy and full of body pain. 10/10

## 2019-03-19 NOTE — ED Notes (Signed)
Pt ambulatory to restroom with slow, slightly unstable gait. One person assist for safety. Pt endorses being dizziness and would have to stop several times to not feel as if he would fall down. Pt assisted back to bed and is on monitors.

## 2019-03-19 NOTE — ED Notes (Signed)
EDP at bedside  

## 2019-03-19 NOTE — ED Provider Notes (Signed)
Medical Decision Making: Care of patient assumed from [facility] PA-C at 1500.  Agree with history, physical exam and plan.  See their note for further details.  Briefly, 54 y.o. male with PMH/PSH as below.  Past Medical History:  Diagnosis Date  . Essential hypertension 10/24/2014  . Family history of premature CAD   . NICM (nonischemic cardiomyopathy) ([facility])    a. 10/2014 Cath: nl cors, EF 35-45%, glob HK.  . Obesity (BMI 30-39.9)    Past Surgical History:  Procedure Laterality Date  . CARDIAC CATHETERIZATION N/A 10/25/2014   Procedure: Left Heart Cath and Coronary Angiography;  Surgeon: Lyn Records, MD;  Location: [facility] INVASIVE CV LAB;  Service: Cardiovascular;  Laterality: N/A;  . None       Patient HDS at handoff.   Plan at time of handoff: Pt here for multiple complaints.  Reports having dizziness for 2-3 days, feeling off balance when walking.    Reports that this AM, he was about to work out with roommate, and developed pain throughout his body as well as difficulty breathing.  He also reports his testicles have been more swollen recently.      Follow up testicular US, reassess.    ED Course Testicular ultrasound with evidence of bilateral hydroceles but no emergent findings, this was discussed with the patient.  Patient was given IV fluids and had some improvement of his dizziness.  However I reassessed the patient and he continues to complain of dizziness with a sensation of feeling off balance.  I performed a neurological exam, his cranial nerves were intact, he has no nystagmus on exam, he had normal strength and sensation in all extremities, he had normal finger-to-nose bilaterally, however when I got the patient up to ambulate him he had some ataxia and felt unsteady on his feet.  The patient reports he has felt this way for 3 days.  As the patient has significant vascular risk factors the decision was made to obtain an MRI to rule out acute stroke as cause of his  dizziness.  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  These are not the cause of his acute symptoms over the last 3 days, however recommended to start 81 mg aspirin and ambulatory referral to neurology was placed for follow-up, these findings were discussed with the patient, strict return precautions provided, discharged in stable condition.  Clinical Impression 1. Dizziness   2. Hydrocele, unspecified hydrocele type       Briaunna Grindstaff, [place], MD 03/20/19 1017    Raeford Razor, MD 03/20/19 [phone]

## 2019-03-19 NOTE — ED Notes (Signed)
Pt taken to MRI in NAD

## 2019-03-19 NOTE — ED Notes (Signed)
Pt was given two sandwiches

## 2019-03-19 NOTE — ED Notes (Signed)
Patient verbalizes understanding of discharge instructions, prescription medications, and follow up information. Opportunity for questioning and answers were provided. All questions answered completely. Armband removed by staff, pt discharged from ED. Ambulatory from ED with strong, steady gait

## 2019-03-26 ENCOUNTER — Other Ambulatory Visit: Payer: Self-pay

## 2019-03-26 ENCOUNTER — Emergency Department (HOSPITAL_COMMUNITY)
Admission: EM | Admit: 2019-03-26 | Discharge: 2019-03-27 | Discharge status: Against Medical Advice | Payer: Medicaid - Out of State | Attending: Emergency Medicine | Admitting: Emergency Medicine

## 2019-03-26 ENCOUNTER — Encounter (HOSPITAL_COMMUNITY): Payer: Self-pay | Admitting: Emergency Medicine

## 2019-03-26 DIAGNOSIS — R55 Syncope and collapse: Secondary | ICD-10-CM | POA: Diagnosis not present

## 2019-03-26 DIAGNOSIS — Z5321 Procedure and treatment not carried out due to patient leaving prior to being seen by health care provider: Secondary | ICD-10-CM | POA: Diagnosis not present

## 2019-03-26 LAB — BASIC METABOLIC PANEL
Anion gap: 7 (ref 5–15)
BUN: 10 mg/dL (ref 6–20)
CO2: 28 mmol/L (ref 22–32)
Calcium: 9.5 mg/dL (ref 8.9–10.3)
Chloride: 105 mmol/L (ref 98–111)
Creatinine, Ser: 1.15 mg/dL (ref 0.61–1.24)
GFR calc Af Amer: >60 mL/min (ref >60)
GFR calc non Af Amer: >60 mL/min (ref >60)
Glucose, Bld: 109 mg/dL — ABNORMAL HIGH (ref 70–99)
Potassium: 4.8 mmol/L (ref 3.5–5.1)
Sodium: 140 mmol/L (ref 135–145)

## 2019-03-26 LAB — CBC
HCT: 55.8 % — ABNORMAL HIGH (ref 39.0–52.0)
Hemoglobin: 18.3 g/dL — ABNORMAL HIGH (ref 13.0–17.0)
MCH: 27.8 pg (ref 26.0–34.0)
MCHC: 32.8 g/dL (ref 30.0–36.0)
MCV: 84.8 fL (ref 80.0–100.0)
Platelets: 203 10*3/uL (ref 150–400)
RBC: 6.58 MIL/uL — ABNORMAL HIGH (ref 4.22–5.81)
RDW: 14.2 % (ref 11.5–15.5)
WBC: 6.2 10*3/uL (ref 4.0–10.5)
nRBC: 0 % (ref 0.0–0.2)

## 2019-03-26 MED ORDER — SODIUM CHLORIDE 0.9% FLUSH: 3.0000 mL | Freq: Once | INTRAVENOUS | Status: DC

## 2019-03-26 NOTE — ED Notes (Signed)
Pt called for treatment room multiple times no answer

## 2019-03-26 NOTE — ED Notes (Addendum)
Called no answer X3 

## 2019-03-26 NOTE — ED Triage Notes (Signed)
Pt BIB [facility] from work, co-worker called EMS stating pt having a "speech problem" and kept repeating "oh my God". Pt A&O x 4 on arrival, stated that he passed out while at work. Denies pain/shortness of breath.

## 2019-04-02 ENCOUNTER — Encounter: Payer: Self-pay | Admitting: Neurology

## 2019-04-02 ENCOUNTER — Telehealth: Payer: Self-pay

## 2019-04-02 ENCOUNTER — Ambulatory Visit: Payer: PRIVATE HEALTH INSURANCE | Admitting: Neurology

## 2019-04-02 NOTE — Telephone Encounter (Signed)
Pt did not show for their appt with Dr. Athar today.  

## 2019-05-19 DIAGNOSIS — E538 Deficiency of other specified B group vitamins: Secondary | ICD-10-CM

## 2019-05-19 DIAGNOSIS — I639 Cerebral infarction, unspecified: Secondary | ICD-10-CM

## 2019-05-19 HISTORY — DX: Deficiency of other specified B group vitamins: E53.8

## 2019-05-19 HISTORY — DX: Cerebral infarction, unspecified: I63.9

## 2019-05-26 ENCOUNTER — Emergency Department (HOSPITAL_COMMUNITY): Payer: Managed Care, Other (non HMO)

## 2019-05-26 ENCOUNTER — Other Ambulatory Visit: Payer: Self-pay

## 2019-05-26 ENCOUNTER — Encounter (HOSPITAL_COMMUNITY): Payer: Self-pay | Admitting: Emergency Medicine

## 2019-05-26 ENCOUNTER — Emergency Department (HOSPITAL_COMMUNITY)
Admission: EM | Admit: 2019-05-26 | Discharge: 2019-05-26 | Disposition: A | Discharge status: Home / Self Care | Payer: Managed Care, Other (non HMO) | Attending: Emergency Medicine | Admitting: Emergency Medicine

## 2019-05-26 DIAGNOSIS — Z87891 Personal history of nicotine dependence: Secondary | ICD-10-CM | POA: Diagnosis not present

## 2019-05-26 DIAGNOSIS — I5022 Chronic systolic (congestive) heart failure: Secondary | ICD-10-CM | POA: Insufficient documentation

## 2019-05-26 DIAGNOSIS — Z79899 Other long term (current) drug therapy: Secondary | ICD-10-CM | POA: Diagnosis not present

## 2019-05-26 DIAGNOSIS — I110 Hypertensive heart disease with heart failure: Secondary | ICD-10-CM | POA: Diagnosis not present

## 2019-05-26 DIAGNOSIS — Z7982 Long term (current) use of aspirin: Secondary | ICD-10-CM | POA: Insufficient documentation

## 2019-05-26 DIAGNOSIS — N50812 Left testicular pain: Secondary | ICD-10-CM | POA: Diagnosis present

## 2019-05-26 DIAGNOSIS — N433 Hydrocele, unspecified: Secondary | ICD-10-CM | POA: Diagnosis not present

## 2019-05-26 LAB — URINALYSIS, ROUTINE W REFLEX MICROSCOPIC
Bilirubin Urine: NEGATIVE
Glucose, UA: NEGATIVE mg/dL
Hgb urine dipstick: NEGATIVE
Ketones, ur: NEGATIVE mg/dL
Leukocytes,Ua: NEGATIVE
Nitrite: NEGATIVE
Protein, ur: NEGATIVE mg/dL
Specific Gravity, Urine: 1.018 (ref 1.005–1.030)
pH: 6 (ref 5.0–8.0)

## 2019-05-26 MED ORDER — NAPROXEN 500 MG PO TABS: 500.0000 mg | ORAL_TABLET | Freq: Two times a day (BID) | ORAL | 0 refills | Status: AC

## 2019-05-26 NOTE — ED Triage Notes (Signed)
C/o L testicle pain and swelling x 2 days.

## 2019-05-26 NOTE — Discharge Instructions (Addendum)
Thank you for allowing me to care for you today. Please return to the emergency department if you have new or worsening symptoms. Take your medications as instructed.  ° °

## 2019-05-26 NOTE — ED Provider Notes (Signed)
[facility] EMERGENCY DEPARTMENT Provider Note   CSN: [id number] Arrival date & time: 05/26/19  1053     History Chief Complaint  Patient presents with  . Testicle Pain    Kenneth Gould is a 54 y.o. male.  Patient is a 54 year old gentleman with past medical history of hypertension, obesity, CHF presenting to the emergency department for testicular pain.  Patient reports that this has been going on for about 2 months.  Reports he has had progressively worsening pain and swelling in the left testicle.  Denies any fever, chills, nausea, vomiting, dysuria.  Reports he has not been sexually active in 6 months or more.        Past Medical History:  Diagnosis Date  . Essential hypertension 10/24/2014  . Family history of premature CAD   . NICM (nonischemic cardiomyopathy) (HCC)    a. 10/2014 Cath: nl cors, EF 35-45%, glob HK.  . Obesity (BMI 30-39.9)     Patient Active Problem List   Diagnosis Date Noted  . Congestive heart failure (CHF) (HCC) 10/12/2018  . Atypical chest pain 08/24/2016  . Chronic systolic CHF (congestive heart failure) (HCC)   . Chest pain   . Unstable angina pectoris (HCC) 10/25/2014  . NICM (nonischemic cardiomyopathy) (HCC) 10/25/2014  . Chest pain with moderate risk for cardiac etiology 10/24/2014  . Essential hypertension 10/24/2014  . Chest pain, moderate coronary artery risk 10/24/2014    Past Surgical History:  Procedure Laterality Date  . CARDIAC CATHETERIZATION N/A 10/25/2014   Procedure: Left Heart Cath and Coronary Angiography;  Surgeon: Lyn Records, MD;  Location: [facility] INVASIVE CV LAB;  Service: Cardiovascular;  Laterality: N/A;  . None         Family History  Problem Relation Age of Onset  . Heart attack Sister 1       Died in her sleep, dx at autopsy  . Heart attack Cousin 61       Died in his sleep    Social History   Tobacco Use  . Smoking status: Former Smoker    Packs/day: 0.50    Years: 30.00   Pack years: 15.00    Types: Cigarettes    Quit date: 08/25/2015    Years since quitting: 3.7  . Smokeless tobacco: Never Used  Substance Use Topics  . Alcohol use: No  . Drug use: Yes    Types: Marijuana    Comment: THC    Home Medications Prior to Admission medications   Medication Sig Start Date End Date Taking? Authorizing Provider  albuterol (VENTOLIN HFA) 108 (90 Base) MCG/ACT inhaler Inhale 2 puffs into the lungs every 6 (six) hours as needed for wheezing or shortness of breath.    [provider]  aspirin 81 MG chewable tablet Chew 1 tablet (81 mg total) by mouth daily. 03/19/19   Hunter, [place], MD  atorvastatin (LIPITOR) 20 MG tablet Take 20 mg by mouth daily. 02/28/19   [provider]  furosemide (LASIX) 20 MG tablet Take 1 tablet (20 mg total) by mouth daily. 10/05/18   Gilda Crease, MD  lidocaine (LIDODERM) 5 % Place 1 patch onto the skin daily. Remove & Discard patch within 12 hours or as directed by MD Patient not taking: Reported on 03/19/2019 10/15/18   Berton Mount I, MD  lisinopril (ZESTRIL) 20 MG tablet Take 1 tablet (20 mg total) by mouth daily. 10/05/18   Gilda Crease, MD  naproxen (NAPROSYN) 500 MG tablet Take 1  tablet (500 mg total) by mouth 2 (two) times daily for 5 days. 05/26/19 05/31/19  Ronnie Doss A, PA-C  potassium chloride SA (K-DUR) 10 MEQ tablet Take 2 tablets (20 mEq total) by mouth daily. 10/05/18   Gilda Crease, MD  sildenafil (VIAGRA) 50 MG tablet Take 50 mg by mouth every Monday, Wednesday, and Friday. 03/16/19   [provider]  traMADol (ULTRAM) 50 MG tablet Take 1 tablet (50 mg total) by mouth every 6 (six) hours as needed. Patient not taking: Reported on 10/12/2018 10/05/18   Gilda Crease, MD  famotidine (PEPCID) 20 MG tablet Take 1 tablet (20 mg total) by mouth 2 (two) times daily. Patient not taking: Reported on 10/05/2018 08/26/16 10/05/18  Joseph Art, DO  hydrochlorothiazide  (MICROZIDE) 12.5 MG capsule Take 1 capsule (12.5 mg total) by mouth daily. Patient not taking: Reported on 10/05/2018 08/27/16 10/05/18  Joseph Art, DO    Allergies    Nyquil multi-symptom [pseudoeph-doxylamine-dm-apap]  Review of Systems   Review of Systems  Constitutional: Negative for chills and fever.  HENT: Negative for ear pain and sore throat.   Eyes: Negative for pain and visual disturbance.  Respiratory: Negative for cough and shortness of breath.   Cardiovascular: Negative for chest pain and palpitations.  Gastrointestinal: Negative for abdominal pain and vomiting.  Genitourinary: Positive for scrotal swelling and testicular pain. Negative for decreased urine volume, discharge, dysuria, hematuria, penile pain and penile swelling.  Musculoskeletal: Negative for arthralgias and back pain.  Skin: Negative for color change and rash.  Neurological: Negative for seizures and syncope.  All other systems reviewed and are negative.   Physical Exam Updated Vital Signs BP (!) 193/83   Pulse 65   Temp 98.2 F (36.8 C) (Oral)   Resp 16   SpO2 (!) 88%   Physical Exam Vitals and nursing note reviewed. Exam conducted with a chaperone present.  Constitutional:      General: He is not in acute distress.    Appearance: Normal appearance. He is obese. He is not ill-appearing, toxic-appearing or diaphoretic.  HENT:     Head: Normocephalic.  Eyes:     Conjunctiva/sclera: Conjunctivae normal.  Pulmonary:     Effort: Pulmonary effort is normal.  Genitourinary:    Penis: Normal.      Comments: Significantly enlarged and tender left testicle. No appreciable hernia. Skin:    General: Skin is dry.  Neurological:     Mental Status: He is alert.  Psychiatric:        Mood and Affect: Mood normal.     ED Results / Procedures / Treatments   Labs (all labs ordered are listed, but only abnormal results are displayed) Labs Reviewed  URINALYSIS, ROUTINE W REFLEX MICROSCOPIC    GC/CHLAMYDIA PROBE AMP ([place]) NOT AT [facility]    EKG None  Radiology US SCROTUM W/DOPPLER  Result Date: 05/26/2019 CLINICAL DATA:  Left scrotal pain and swelling EXAM: SCROTAL ULTRASOUND DOPPLER ULTRASOUND OF THE TESTICLES TECHNIQUE: Complete ultrasound examination of the testicles, epididymis, and other scrotal structures was performed. Color and spectral Doppler ultrasound were also utilized to evaluate blood flow to the testicles. COMPARISON:  March 19, 2019 FINDINGS: Right testicle Measurements: 4.4 x 3.2 x 2.9 cm. No mass or microlithiasis visualized. Left testicle Measurements: 4.9 x 2.8 x 3.6 cm. No mass or microlithiasis visualized. Right epididymis: Normal in size and appearance. No inflammatory focus evident. Left epididymis: Left epididymis is largely compressed by large hydrocele. No mass  or inflammatory focus in the epididymal region evident. Hydrocele: Large hydrocele on each side, larger on the left than on the right. Varicocele:  None visualized. Pulsed Doppler interrogation of both testes demonstrates normal low resistance arterial and venous waveforms bilaterally. Normal appearing appendix testis noted on each side incidentally. No scrotal abscess or scrotal wall thickening on either side. IMPRESSION: Sizable hydroceles bilaterally, larger on the left than on the right. No evident intratesticular or extratesticular mass on either side. No testicular or epididymal inflammation on either side. No testicular torsion on either side. Electronically Signed   By: Lowella Grip III M.D.   On: 05/26/2019 13:11    Procedures Procedures (including critical care time)  Medications Ordered in ED Medications - No data to display  ED Course  I have reviewed the triage vital signs and the nursing notes.  Pertinent labs & imaging results that were available during my care of the patient were reviewed by me and considered in my medical decision making (see chart for details).  Clinical  Course as of May 25 1444  Sat May 26, 2019  1324 Patient presenting with 2-3 months of left sided testicle/scrotum swelling. Ultrasound showing large hydrocele on the left. Will treat with nsaids and refer to urology. Patient did not want to wait for his urine results prior to d/c   [KM]    Clinical Course User Index [KM] Kristine Royal   MDM Rules/Calculators/A&P                      Based on review of vitals, medical screening exam, lab work and/or imaging, there does not appear to be an acute, emergent etiology for the patient's symptoms. Counseled pt on good return precautions and encouraged both PCP and ED follow-up as needed.  Prior to discharge, I also discussed incidental imaging findings with patient in detail and advised appropriate, recommended follow-up in detail.  Clinical Impression: 1. Hydrocele in adult     Disposition: Discharge  Prior to providing a prescription for a controlled substance, I independently reviewed the patient's recent prescription history on the [place]. The patient had no recent or regular prescriptions and was deemed appropriate for a brief, less than 3 day prescription of narcotic for acute analgesia.  This note was prepared with assistance of Systems analyst. Occasional wrong-word or sound-a-like substitutions may have occurred due to the inherent limitations of voice recognition software.  Final Clinical Impression(s) / ED Diagnoses Final diagnoses:  Hydrocele in adult    Rx / DC Orders ED Discharge Orders         Ordered    naproxen (NAPROSYN) 500 MG tablet  2 times daily     05/26/19 1344           Kristine Royal 05/26/19 1446    Malvin Johns, MD 05/26/19 1524

## 2019-06-03 ENCOUNTER — Encounter (HOSPITAL_COMMUNITY): Payer: Self-pay | Admitting: Emergency Medicine

## 2019-06-03 ENCOUNTER — Observation Stay (HOSPITAL_COMMUNITY)
Admission: EM | Admit: 2019-06-03 | Discharge: 2019-06-06 | Disposition: A | Discharge status: Home / Self Care | Payer: Managed Care, Other (non HMO) | Attending: Internal Medicine | Admitting: Internal Medicine

## 2019-06-03 ENCOUNTER — Other Ambulatory Visit: Payer: Self-pay

## 2019-06-03 DIAGNOSIS — K0889 Other specified disorders of teeth and supporting structures: Secondary | ICD-10-CM | POA: Insufficient documentation

## 2019-06-03 DIAGNOSIS — H53122 Transient visual loss, left eye: Secondary | ICD-10-CM | POA: Diagnosis not present

## 2019-06-03 DIAGNOSIS — I5022 Chronic systolic (congestive) heart failure: Secondary | ICD-10-CM | POA: Diagnosis not present

## 2019-06-03 DIAGNOSIS — I428 Other cardiomyopathies: Secondary | ICD-10-CM | POA: Diagnosis not present

## 2019-06-03 DIAGNOSIS — Z9189 Other specified personal risk factors, not elsewhere classified: Secondary | ICD-10-CM | POA: Diagnosis present

## 2019-06-03 DIAGNOSIS — Z20822 Contact with and (suspected) exposure to covid-19: Secondary | ICD-10-CM | POA: Insufficient documentation

## 2019-06-03 DIAGNOSIS — I6381 Other cerebral infarction due to occlusion or stenosis of small artery: Secondary | ICD-10-CM | POA: Diagnosis not present

## 2019-06-03 DIAGNOSIS — E538 Deficiency of other specified B group vitamins: Secondary | ICD-10-CM | POA: Insufficient documentation

## 2019-06-03 DIAGNOSIS — Z7982 Long term (current) use of aspirin: Secondary | ICD-10-CM | POA: Insufficient documentation

## 2019-06-03 DIAGNOSIS — N433 Hydrocele, unspecified: Secondary | ICD-10-CM | POA: Insufficient documentation

## 2019-06-03 DIAGNOSIS — I639 Cerebral infarction, unspecified: Secondary | ICD-10-CM | POA: Diagnosis present

## 2019-06-03 DIAGNOSIS — N5082 Scrotal pain: Secondary | ICD-10-CM | POA: Insufficient documentation

## 2019-06-03 DIAGNOSIS — K047 Periapical abscess without sinus: Secondary | ICD-10-CM | POA: Insufficient documentation

## 2019-06-03 DIAGNOSIS — Z7901 Long term (current) use of anticoagulants: Secondary | ICD-10-CM | POA: Insufficient documentation

## 2019-06-03 DIAGNOSIS — R42 Dizziness and giddiness: Secondary | ICD-10-CM | POA: Diagnosis not present

## 2019-06-03 DIAGNOSIS — G43919 Migraine, unspecified, intractable, without status migrainosus: Secondary | ICD-10-CM

## 2019-06-03 DIAGNOSIS — R079 Chest pain, unspecified: Secondary | ICD-10-CM | POA: Insufficient documentation

## 2019-06-03 DIAGNOSIS — I10 Essential (primary) hypertension: Secondary | ICD-10-CM | POA: Diagnosis present

## 2019-06-03 DIAGNOSIS — M273 Alveolitis of jaws: Secondary | ICD-10-CM

## 2019-06-03 DIAGNOSIS — E785 Hyperlipidemia, unspecified: Secondary | ICD-10-CM | POA: Insufficient documentation

## 2019-06-03 DIAGNOSIS — A5211 Tabes dorsalis: Principal | ICD-10-CM | POA: Insufficient documentation

## 2019-06-03 DIAGNOSIS — Z8249 Family history of ischemic heart disease and other diseases of the circulatory system: Secondary | ICD-10-CM | POA: Insufficient documentation

## 2019-06-03 DIAGNOSIS — G9389 Other specified disorders of brain: Secondary | ICD-10-CM | POA: Insufficient documentation

## 2019-06-03 DIAGNOSIS — I200 Unstable angina: Secondary | ICD-10-CM | POA: Diagnosis not present

## 2019-06-03 DIAGNOSIS — R4182 Altered mental status, unspecified: Secondary | ICD-10-CM | POA: Insufficient documentation

## 2019-06-03 DIAGNOSIS — R202 Paresthesia of skin: Secondary | ICD-10-CM | POA: Insufficient documentation

## 2019-06-03 DIAGNOSIS — Z6841 Body Mass Index (BMI) 40.0 and over, adult: Secondary | ICD-10-CM | POA: Diagnosis not present

## 2019-06-03 DIAGNOSIS — I110 Hypertensive heart disease with heart failure: Secondary | ICD-10-CM | POA: Insufficient documentation

## 2019-06-03 DIAGNOSIS — Z87891 Personal history of nicotine dependence: Secondary | ICD-10-CM | POA: Diagnosis not present

## 2019-06-03 DIAGNOSIS — R200 Anesthesia of skin: Secondary | ICD-10-CM | POA: Diagnosis not present

## 2019-06-03 DIAGNOSIS — R0981 Nasal congestion: Secondary | ICD-10-CM | POA: Insufficient documentation

## 2019-06-03 DIAGNOSIS — Z8673 Personal history of transient ischemic attack (TIA), and cerebral infarction without residual deficits: Secondary | ICD-10-CM | POA: Insufficient documentation

## 2019-06-03 DIAGNOSIS — Z888 Allergy status to other drugs, medicaments and biological substances status: Secondary | ICD-10-CM | POA: Insufficient documentation

## 2019-06-03 DIAGNOSIS — R6884 Jaw pain: Secondary | ICD-10-CM | POA: Insufficient documentation

## 2019-06-03 DIAGNOSIS — R519 Headache, unspecified: Secondary | ICD-10-CM | POA: Diagnosis present

## 2019-06-03 DIAGNOSIS — K029 Dental caries, unspecified: Secondary | ICD-10-CM | POA: Insufficient documentation

## 2019-06-03 DIAGNOSIS — F1210 Cannabis abuse, uncomplicated: Secondary | ICD-10-CM | POA: Insufficient documentation

## 2019-06-03 DIAGNOSIS — E669 Obesity, unspecified: Secondary | ICD-10-CM | POA: Diagnosis not present

## 2019-06-03 DIAGNOSIS — I739 Peripheral vascular disease, unspecified: Secondary | ICD-10-CM | POA: Insufficient documentation

## 2019-06-03 DIAGNOSIS — Z79899 Other long term (current) drug therapy: Secondary | ICD-10-CM | POA: Diagnosis not present

## 2019-06-03 DIAGNOSIS — H838X1 Other specified diseases of right inner ear: Secondary | ICD-10-CM | POA: Insufficient documentation

## 2019-06-03 DIAGNOSIS — R278 Other lack of coordination: Secondary | ICD-10-CM

## 2019-06-03 DIAGNOSIS — Z791 Long term (current) use of non-steroidal anti-inflammatories (NSAID): Secondary | ICD-10-CM | POA: Insufficient documentation

## 2019-06-03 NOTE — ED Triage Notes (Signed)
Pt reports having headache that started yesterday and has been having pain behind right eye.

## 2019-06-04 ENCOUNTER — Emergency Department (HOSPITAL_COMMUNITY): Payer: Managed Care, Other (non HMO)

## 2019-06-04 ENCOUNTER — Encounter (HOSPITAL_COMMUNITY): Payer: Self-pay

## 2019-06-04 DIAGNOSIS — R519 Headache, unspecified: Secondary | ICD-10-CM | POA: Diagnosis not present

## 2019-06-04 DIAGNOSIS — Z9189 Other specified personal risk factors, not elsewhere classified: Secondary | ICD-10-CM | POA: Diagnosis not present

## 2019-06-04 DIAGNOSIS — I5022 Chronic systolic (congestive) heart failure: Secondary | ICD-10-CM | POA: Diagnosis not present

## 2019-06-04 DIAGNOSIS — I639 Cerebral infarction, unspecified: Secondary | ICD-10-CM | POA: Diagnosis present

## 2019-06-04 LAB — BASIC METABOLIC PANEL
Anion gap: 8 (ref 5–15)
BUN: 13 mg/dL (ref 6–20)
CO2: 28 mmol/L (ref 22–32)
Calcium: 9 mg/dL (ref 8.9–10.3)
Chloride: 102 mmol/L (ref 98–111)
Creatinine, Ser: 1.06 mg/dL (ref 0.61–1.24)
GFR calc Af Amer: >60 mL/min (ref >60)
GFR calc non Af Amer: >60 mL/min (ref >60)
Glucose, Bld: 108 mg/dL — ABNORMAL HIGH (ref 70–99)
Potassium: 4 mmol/L (ref 3.5–5.1)
Sodium: 138 mmol/L (ref 135–145)

## 2019-06-04 LAB — CBC WITH DIFFERENTIAL/PLATELET
Abs Immature Granulocytes: 0.01 10*3/uL (ref 0.00–0.07)
Basophils Absolute: 0 10*3/uL (ref 0.0–0.1)
Basophils Relative: 1 %
Eosinophils Absolute: 0.2 10*3/uL (ref 0.0–0.5)
Eosinophils Relative: 2 %
HCT: 50 % (ref 39.0–52.0)
Hemoglobin: 16.2 g/dL (ref 13.0–17.0)
Immature Granulocytes: 0 %
Lymphocytes Relative: 37 %
Lymphs Abs: 2.5 10*3/uL (ref 0.7–4.0)
MCH: 27.8 pg (ref 26.0–34.0)
MCHC: 32.4 g/dL (ref 30.0–36.0)
MCV: 85.9 fL (ref 80.0–100.0)
Monocytes Absolute: 0.6 10*3/uL (ref 0.1–1.0)
Monocytes Relative: 8 %
Neutro Abs: 3.5 10*3/uL (ref 1.7–7.7)
Neutrophils Relative %: 52 %
Platelets: 176 10*3/uL (ref 150–400)
RBC: 5.82 MIL/uL — ABNORMAL HIGH (ref 4.22–5.81)
RDW: 15.1 % (ref 11.5–15.5)
WBC: 6.8 10*3/uL (ref 4.0–10.5)
nRBC: 0 % (ref 0.0–0.2)

## 2019-06-04 LAB — LACTIC ACID, PLASMA: Lactic Acid, Venous: 1 mmol/L (ref 0.5–1.9)

## 2019-06-04 LAB — RAPID URINE DRUG SCREEN, HOSP PERFORMED
Amphetamines: NOT DETECTED
Barbiturates: NOT DETECTED
Benzodiazepines: NOT DETECTED
Cocaine: NOT DETECTED
Opiates: NOT DETECTED
Tetrahydrocannabinol: POSITIVE — AB

## 2019-06-04 LAB — BLOOD GAS, ARTERIAL
Acid-Base Excess: 2 mmol/L (ref 0.0–2.0)
Bicarbonate: 26.2 mmol/L (ref 20.0–28.0)
Drawn by: 336832
O2 Saturation: 96.6 %
Patient temperature: 98.6
pCO2 arterial: 41.6 mmHg (ref 32.0–48.0)
pH, Arterial: 7.417 (ref 7.350–7.450)
pO2, Arterial: 84.3 mmHg (ref 83.0–108.0)

## 2019-06-04 LAB — HEPATIC FUNCTION PANEL
ALT: 15 U/L (ref 0–44)
AST: 23 U/L (ref 15–41)
Albumin: 3.3 g/dL — ABNORMAL LOW (ref 3.5–5.0)
Alkaline Phosphatase: 70 U/L (ref 38–126)
Bilirubin, Direct: 0.4 mg/dL — ABNORMAL HIGH (ref 0.0–0.2)
Indirect Bilirubin: 0.9 mg/dL (ref 0.3–0.9)
Total Bilirubin: 1.3 mg/dL — ABNORMAL HIGH (ref 0.3–1.2)
Total Protein: 6.5 g/dL (ref 6.5–8.1)

## 2019-06-04 LAB — TSH: TSH: 0.995 u[IU]/mL (ref 0.350–4.500)

## 2019-06-04 LAB — SARS CORONAVIRUS 2 BY RT PCR (HOSPITAL ORDER, PERFORMED IN ~~LOC~~ HOSPITAL LAB): SARS Coronavirus 2: NEGATIVE

## 2019-06-04 LAB — PROTIME-INR
INR: 1.1 (ref 0.8–1.2)
Prothrombin Time: 13.4 seconds (ref 11.4–15.2)

## 2019-06-04 LAB — AMMONIA: Ammonia: 55 umol/L — ABNORMAL HIGH (ref 9–35)

## 2019-06-04 LAB — C-REACTIVE PROTEIN: CRP: <0.5 mg/dL (ref <1.0)

## 2019-06-04 LAB — SEDIMENTATION RATE: Sed Rate: 0 mm/hr (ref 0–16)

## 2019-06-04 MED ORDER — ASPIRIN 325 MG PO TABS
325.0000 mg | ORAL_TABLET | Freq: Every day | ORAL | Status: DC
  Administered 2019-06-05 – 2019-06-06 (×2): 325 mg via ORAL
  Filled 2019-06-04 (×2): qty 1

## 2019-06-04 MED ORDER — FUROSEMIDE 20 MG PO TABS
20.0000 mg | ORAL_TABLET | Freq: Every day | ORAL | Status: DC
  Administered 2019-06-05 – 2019-06-06 (×2): 20 mg via ORAL
  Filled 2019-06-04 (×2): qty 1

## 2019-06-04 MED ORDER — ACETAMINOPHEN 500 MG PO TABS
1000.0000 mg | ORAL_TABLET | Freq: Once | ORAL | Status: AC
  Administered 2019-06-04: 1000 mg via ORAL
  Filled 2019-06-04: qty 2

## 2019-06-04 MED ORDER — KETOROLAC TROMETHAMINE 30 MG/ML IJ SOLN
30.0000 mg | Freq: Once | INTRAMUSCULAR | Status: AC
  Administered 2019-06-04: 30 mg via INTRAVENOUS
  Filled 2019-06-04: qty 1

## 2019-06-04 MED ORDER — DEXAMETHASONE SODIUM PHOSPHATE 10 MG/ML IJ SOLN
6.0000 mg | Freq: Once | INTRAMUSCULAR | Status: AC
  Administered 2019-06-05: 6 mg via INTRAVENOUS
  Filled 2019-06-04: qty 1

## 2019-06-04 MED ORDER — LISINOPRIL 20 MG PO TABS
20.0000 mg | ORAL_TABLET | Freq: Every day | ORAL | Status: DC
  Administered 2019-06-05 – 2019-06-06 (×2): 20 mg via ORAL
  Filled 2019-06-04 (×2): qty 1

## 2019-06-04 MED ORDER — PROCHLORPERAZINE EDISYLATE 10 MG/2ML IJ SOLN
10.0000 mg | Freq: Once | INTRAMUSCULAR | Status: AC
  Administered 2019-06-04: 10 mg via INTRAVENOUS
  Filled 2019-06-04: qty 2

## 2019-06-04 MED ORDER — SODIUM CHLORIDE (PF) 0.9 % IJ SOLN
INTRAMUSCULAR | Status: AC
  Filled 2019-06-04: qty 50

## 2019-06-04 MED ORDER — ALBUTEROL SULFATE HFA 108 (90 BASE) MCG/ACT IN AERS: 2.0000 | INHALATION_SPRAY | Freq: Four times a day (QID) | RESPIRATORY_TRACT | Status: DC | PRN

## 2019-06-04 MED ORDER — ACETAMINOPHEN 325 MG PO TABS
650.0000 mg | ORAL_TABLET | Freq: Four times a day (QID) | ORAL | Status: DC | PRN
  Administered 2019-06-05: 650 mg via ORAL
  Filled 2019-06-04: qty 2

## 2019-06-04 MED ORDER — ATORVASTATIN CALCIUM 10 MG PO TABS
20.0000 mg | ORAL_TABLET | Freq: Every day | ORAL | Status: DC
  Administered 2019-06-05 – 2019-06-06 (×2): 20 mg via ORAL
  Filled 2019-06-04 (×2): qty 2

## 2019-06-04 MED ORDER — KETOROLAC TROMETHAMINE 30 MG/ML IJ SOLN
30.0000 mg | Freq: Four times a day (QID) | INTRAMUSCULAR | Status: DC
  Administered 2019-06-05 – 2019-06-06 (×6): 30 mg via INTRAVENOUS
  Filled 2019-06-04 (×6): qty 1

## 2019-06-04 MED ORDER — KETOROLAC TROMETHAMINE 15 MG/ML IJ SOLN
15.0000 mg | Freq: Once | INTRAMUSCULAR | Status: AC
  Administered 2019-06-04: 15 mg via INTRAVENOUS
  Filled 2019-06-04: qty 1

## 2019-06-04 MED ORDER — SODIUM CHLORIDE 0.9 % IV BOLUS
500.0000 mL | Freq: Once | INTRAVENOUS | Status: AC
  Administered 2019-06-04: 500 mL via INTRAVENOUS

## 2019-06-04 MED ORDER — MORPHINE SULFATE (PF) 2 MG/ML IV SOLN
1.0000 mg | INTRAVENOUS | Status: DC | PRN
  Administered 2019-06-05 (×2): 1 mg via INTRAVENOUS
  Filled 2019-06-04 (×2): qty 1

## 2019-06-04 MED ORDER — POLYETHYLENE GLYCOL 3350 17 G PO PACK: 17.0000 g | PACK | Freq: Every day | ORAL | Status: DC | PRN

## 2019-06-04 MED ORDER — IOHEXOL 350 MG/ML SOLN
100.0000 mL | Freq: Once | INTRAVENOUS | Status: AC | PRN
  Administered 2019-06-04: 100 mL via INTRAVENOUS

## 2019-06-04 MED ORDER — DIPHENHYDRAMINE HCL 50 MG/ML IJ SOLN
25.0000 mg | Freq: Once | INTRAMUSCULAR | Status: AC
  Administered 2019-06-04: 25 mg via INTRAVENOUS
  Filled 2019-06-04: qty 1

## 2019-06-04 MED ORDER — ALBUTEROL SULFATE (2.5 MG/3ML) 0.083% IN NEBU: 2.5000 mg | INHALATION_SOLUTION | Freq: Four times a day (QID) | RESPIRATORY_TRACT | Status: DC | PRN

## 2019-06-04 MED ORDER — CLONIDINE HCL 0.1 MG PO TABS
0.1000 mg | ORAL_TABLET | Freq: Four times a day (QID) | ORAL | Status: DC | PRN
  Administered 2019-06-05: 0.1 mg via ORAL
  Filled 2019-06-04: qty 1

## 2019-06-04 MED ORDER — ACETAMINOPHEN 650 MG RE SUPP: 650.0000 mg | Freq: Four times a day (QID) | RECTAL | Status: DC | PRN

## 2019-06-04 MED ORDER — LORATADINE 10 MG PO TABS
10.0000 mg | ORAL_TABLET | Freq: Once | ORAL | Status: AC
  Administered 2019-06-05: 10 mg via ORAL
  Filled 2019-06-04: qty 1

## 2019-06-04 MED ORDER — TETRACAINE HCL 0.5 % OP SOLN
2.0000 [drp] | Freq: Once | OPHTHALMIC | Status: AC
  Administered 2019-06-04: 2 [drp] via OPHTHALMIC
  Filled 2019-06-04: qty 4

## 2019-06-04 MED ORDER — HYDROMORPHONE HCL 1 MG/ML IJ SOLN
1.0000 mg | Freq: Once | INTRAMUSCULAR | Status: AC
  Administered 2019-06-04: 1 mg via INTRAVENOUS
  Filled 2019-06-04: qty 1

## 2019-06-04 MED ORDER — HYDROMORPHONE HCL 1 MG/ML IJ SOLN
0.5000 mg | Freq: Once | INTRAMUSCULAR | Status: AC
  Administered 2019-06-04: 0.5 mg via INTRAVENOUS
  Filled 2019-06-04: qty 1

## 2019-06-04 MED ORDER — ENOXAPARIN SODIUM 40 MG/0.4ML ~~LOC~~ SOLN
40.0000 mg | Freq: Every day | SUBCUTANEOUS | Status: DC
  Administered 2019-06-05: 40 mg via SUBCUTANEOUS
  Filled 2019-06-04: qty 0.4

## 2019-06-04 MED ORDER — PROMETHAZINE HCL 25 MG PO TABS: 12.5000 mg | ORAL_TABLET | Freq: Four times a day (QID) | ORAL | Status: DC | PRN

## 2019-06-04 MED ORDER — ASPIRIN 81 MG PO CHEW: 81.0000 mg | CHEWABLE_TABLET | Freq: Every day | ORAL | Status: DC

## 2019-06-04 MED ORDER — METOCLOPRAMIDE HCL 5 MG/ML IJ SOLN
10.0000 mg | Freq: Four times a day (QID) | INTRAMUSCULAR | Status: AC
  Administered 2019-06-05 (×4): 10 mg via INTRAVENOUS
  Filled 2019-06-04 (×4): qty 2

## 2019-06-04 MED ORDER — HYDRALAZINE HCL 20 MG/ML IJ SOLN: 20.0000 mg | Freq: Once | INTRAMUSCULAR | Status: DC

## 2019-06-04 NOTE — ED Notes (Signed)
ED TO INPATIENT HANDOFF REPORT  Name/Age/Gender Kenneth Gould 54 y.o. male  Code Status Code Status History    Date Active Date Inactive Code Status Order ID Comments User Context   10/12/2018 2345 10/14/2018 1718 Full Code [id number]  Orene Desanctis, DO ED   08/24/2016 0715 08/26/2016 1607 Full Code [id number]  Rondel Jumbo, PA-C ED   10/25/2014 1416 10/26/2014 2018 Full Code [id number]  Belva Crome, MD Inpatient   10/24/2014 1914 10/25/2014 1416 Full Code [id number]  Barrett, Evelene Croon, PA-C Inpatient   Advance Care Planning Activity      Home/SNF/Other Home  Chief Complaint At risk for inadequate pain control [Z91.89]  Level of Care/Admitting Diagnosis ED Disposition    ED Disposition Condition [facility] Area: [facility] [[id number]]  Level of Care: Med-Surg [16]  Covid Evaluation: Asymptomatic Screening Protocol (No Symptoms)  Diagnosis: At risk for inadequate pain control [9675916]  Admitting Physician: [place], Moore  Attending Physician: Quintella Baton [[id number]]       Medical History Past Medical History:  Diagnosis Date  . Essential hypertension 10/24/2014  . Family history of premature CAD   . NICM (nonischemic cardiomyopathy) ([place])    a. 10/2014 Cath: nl cors, EF 35-45%, glob HK.  . Obesity (BMI 30-39.9)     Allergies Allergies  Allergen Reactions  . Nyquil Multi-Symptom [Pseudoeph-Doxylamine-Dm-Apap] Nausea And Vomiting    IV Location/Drains/Wounds Patient Lines/Drains/Airways Status   Active Line/Drains/Airways    Name:   Placement date:   Placement time:   Site:   Days:   Peripheral IV 06/04/19 Right Forearm   06/04/19    0010    Forearm   less than 1          Labs/Imaging Results for orders placed or performed during the hospital encounter of 06/03/19 (from the past 48 hour(s))  CBC with Differential/Platelet     Status: Abnormal   Collection Time: 06/04/19 12:07 AM  Result Value Ref Range   WBC 6.8 4.0 - 10.5  K/uL   RBC 5.82 (H) 4.22 - 5.81 MIL/uL   Hemoglobin 16.2 13.0 - 17.0 g/dL   HCT 50.0 39.0 - 52.0 %   MCV 85.9 80.0 - 100.0 fL   MCH 27.8 26.0 - 34.0 pg   MCHC 32.4 30.0 - 36.0 g/dL   RDW 15.1 11.5 - 15.5 %   Platelets 176 150 - 400 K/uL   nRBC 0.0 0.0 - 0.2 %   Neutrophils Relative % 52 %   Neutro Abs 3.5 1.7 - 7.7 K/uL   Lymphocytes Relative 37 %   Lymphs Abs 2.5 0.7 - 4.0 K/uL   Monocytes Relative 8 %   Monocytes Absolute 0.6 0.1 - 1.0 K/uL   Eosinophils Relative 2 %   Eosinophils Absolute 0.2 0.0 - 0.5 K/uL   Basophils Relative 1 %   Basophils Absolute 0.0 0.0 - 0.1 K/uL   Immature Granulocytes 0 %   Abs Immature Granulocytes 0.01 0.00 - 0.07 K/uL    Comment: Performed at [facility], [place] [address], [place], [place] [address]  Basic metabolic panel     Status: Abnormal   Collection Time: 06/04/19 12:07 AM  Result Value Ref Range   Sodium 138 135 - 145 mmol/L   Potassium 4.0 3.5 - 5.1 mmol/L   Chloride 102 98 - 111 mmol/L   CO2 28 22 - 32 mmol/L   Glucose, Bld 108 (H) 70 - 99 mg/dL  Comment: Glucose reference range applies only to samples taken after fasting for at least 8 hours.   BUN 13 6 - 20 mg/dL   Creatinine, Ser 4.40 0.61 - 1.24 mg/dL   Calcium 9.0 8.9 - 34.7 mg/dL   GFR calc non Af Amer >60 >60 mL/min   GFR calc Af Amer >60 >60 mL/min   Anion gap 8 5 - 15    Comment: Performed at [facility], [address] [place], [place] [address]  Lactic acid, plasma     Status: None   Collection Time: 06/04/19 12:14 PM  Result Value Ref Range   Lactic Acid, Venous 1.0 0.5 - 1.9 mmol/L    Comment: Performed at [facility], [address] [place], [place] [address]  Blood gas, arterial     Status: None   Collection Time: 06/04/19 12:14 PM  Result Value Ref Range   Delivery systems ROOM AIR    pH, Arterial 7.417 7.350 - 7.450   pCO2 arterial 41.6 32.0 - 48.0 mmHg   pO2, Arterial 84.3 83.0 - 108.0 mmHg    Bicarbonate 26.2 20.0 - 28.0 mmol/L   Acid-Base Excess 2.0 0.0 - 2.0 mmol/L   O2 Saturation 96.6 %   Patient temperature 98.6    Collection site RIGHT RADIAL    Drawn by [id number]    Sample type ARTERIAL    Allens test (pass/fail) PASS PASS    Comment: Performed at [facility], [address], [place], [place] [address]  Ammonia     Status: Abnormal   Collection Time: 06/04/19 12:15 PM  Result Value Ref Range   Ammonia 55 (H) 9 - 35 umol/L    Comment: Performed at [facility], [address], [place], [place] [address]  TSH     Status: None   Collection Time: 06/04/19 12:17 PM  Result Value Ref Range   TSH 0.995 0.350 - 4.500 uIU/mL    Comment: Performed by a 3rd Generation assay with a functional sensitivity of <=0.01 uIU/mL. Performed at [facility], [address] [place], [place] [address]   C-reactive protein     Status: None   Collection Time: 06/04/19 12:18 PM  Result Value Ref Range   CRP <0.5 <1.0 mg/dL    Comment: Performed at [facility], [address] [place], [place] [address]  Hepatic function panel     Status: Abnormal   Collection Time: 06/04/19 12:41 PM  Result Value Ref Range   Total Protein 6.5 6.5 - 8.1 g/dL   Albumin 3.3 (L) 3.5 - 5.0 g/dL   AST 23 15 - 41 U/L   ALT 15 0 - 44 U/L   Alkaline Phosphatase 70 38 - 126 U/L   Total Bilirubin 1.3 (H) 0.3 - 1.2 mg/dL   Bilirubin, Direct 0.4 (H) 0.0 - 0.2 mg/dL   Indirect Bilirubin 0.9 0.3 - 0.9 mg/dL    Comment: Performed at [facility], [address], [place], [place] [address]  Protime-INR     Status: None   Collection Time: 06/04/19 12:41 PM  Result Value Ref Range   Prothrombin Time 13.4 11.4 - 15.2 seconds   INR 1.1 0.8 - 1.2    Comment: (NOTE) INR goal varies based on device and disease states. Performed at [facility] [address] [place], [place] [address]   Sedimentation  rate     Status: None   Collection Time:  06/04/19 12:41 PM  Result Value Ref Range   Sed Rate 0 0 - 16 mm/hr    Comment: Performed at [facility], [address], [place], [place] [address]  Urine rapid drug screen (hosp performed)     Status: Abnormal   Collection Time: 06/04/19  4:50 PM  Result Value Ref Range   Opiates NONE DETECTED NONE DETECTED   Cocaine NONE DETECTED NONE DETECTED   Benzodiazepines NONE DETECTED NONE DETECTED   Amphetamines NONE DETECTED NONE DETECTED   Tetrahydrocannabinol POSITIVE (A) NONE DETECTED   Barbiturates NONE DETECTED NONE DETECTED    Comment: (NOTE) DRUG SCREEN FOR MEDICAL PURPOSES ONLY.  IF CONFIRMATION IS NEEDED FOR ANY PURPOSE, NOTIFY LAB WITHIN 5 DAYS. LOWEST DETECTABLE LIMITS FOR URINE DRUG SCREEN Drug Class                     Cutoff (ng/mL) Amphetamine and metabolites    1000 Barbiturate and metabolites    200 Benzodiazepine                 200 Tricyclics and metabolites     300 Opiates and metabolites        300 Cocaine and metabolites        300 THC                            50 Performed at [facility], [address] [place], [place] [address]   SARS Coronavirus 2 by RT PCR (hospital order, performed in [facility] hospital lab) Nasopharyngeal Nasopharyngeal Swab     Status: None   Collection Time: 06/04/19  7:24 PM   Specimen: Nasopharyngeal Swab  Result Value Ref Range   SARS Coronavirus 2 NEGATIVE NEGATIVE    Comment: (NOTE) SARS-CoV-2 target nucleic acids are NOT DETECTED. The SARS-CoV-2 RNA is generally detectable in upper and lower respiratory specimens during the acute phase of infection. The lowest concentration of SARS-CoV-2 viral copies this assay can detect is 250 copies / mL. A negative result does not preclude SARS-CoV-2 infection and should not be used as the sole basis for treatment or other patient management decisions.  A negative result may occur with improper specimen  collection / handling, submission of specimen other than nasopharyngeal swab, presence of viral mutation(s) within the areas targeted by this assay, and inadequate number of viral copies (<250 copies / mL). A negative result must be combined with clinical observations, patient history, and epidemiological information. Fact Sheet for Patients:   BoilerBrush.com.cy Fact Sheet for Healthcare Providers: https://pope.com/ This test is not yet approved or cleared  by the [place] FDA and has been authorized for detection and/or diagnosis of SARS-CoV-2 by FDA under an Emergency Use Authorization (EUA).  This EUA will remain in effect (meaning this test can be used) for the duration of the COVID-19 declaration under Section 564(b)(1) of the Act, 21 U.S.C. section 360bbb-3(b)(1), unless the authorization is terminated or revoked sooner. Performed at [facility], [address], [place], [place] [address]    CT Angio Head W or Wo Contrast  Result Date: 06/04/2019 CLINICAL DATA:  Headache with pain behind the right eye EXAM: CT ANGIOGRAPHY HEAD TECHNIQUE: Multidetector CT imaging of the head was performed using the standard protocol during bolus administration of intravenous contrast. Multiplanar CT image reconstructions and MIPs were obtained to evaluate the vascular anatomy. CONTRAST:  OMNIPAQUE IOHEXOL 350 MG/ML SOLN  COMPARISON:  08/23/2016 head CT FINDINGS: CT HEAD Brain: There is an old left caudate small vessel infarct. No acute hemorrhage or extra-axial collection. Brain parenchyma otherwise normal. Vascular: No abnormal hyperdensity of the major intracranial arteries or dural venous sinuses. No intracranial atherosclerosis. Skull: The visualized skull base, calvarium and extracranial soft tissues are normal. Sinuses/Orbits: No fluid levels or advanced mucosal thickening of the visualized paranasal sinuses. No mastoid or  middle ear effusion. The orbits are normal. CTA HEAD POSTERIOR CIRCULATION: --Vertebral arteries: Normal V4 segments. --Inferior cerebellar arteries: Normal. --Basilar artery: Normal. --Superior cerebellar arteries: Normal. --Posterior cerebral arteries: Normal. ANTERIOR CIRCULATION: --Intracranial internal carotid arteries: Normal. --Anterior cerebral arteries (ACA): Normal. Both A1 segments are present. Patent anterior communicating artery (a-comm). --Middle cerebral arteries (MCA): Normal. Venous sinuses: As permitted by contrast timing, patent. Anatomic variants: None IMPRESSION: 1. No intracranial arterial occlusion or high-grade stenosis. 2. No aneurysm or subarachnoid hemorrhage. 3. 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 Electronically Signed   By: Deatra Robinson M.D.   On: 06/04/2019 01:56   MR BRAIN WO CONTRAST  Result Date: 06/04/2019 CLINICAL DATA:  54 year old male with unexplained altered mental status. Headache with pain behind the right eye. EXAM: MRI HEAD WITHOUT CONTRAST TECHNIQUE: Multiplanar, multiecho pulse sequences of the brain and surrounding structures were obtained without intravenous contrast. COMPARISON:  CT and CTA head earlier today.  Brain MRI 03/19/2019. FINDINGS: Brain: No restricted diffusion or evidence of acute infarction. Chronic ischemia with encephalomalacia in the left middle frontal gyrus, corona radiata, left lentiform. However, contralateral T2 and FLAIR hyperintense linear lesion of the anterior right frontal lobe white matter is new from 03/19/2019 but most resembles a chronic lacunar infarct (series 8, image 17, series 11, image 35, with facilitated diffusion). Chronic hemosiderin associated with the former, but trace if any microhemorrhage in the new or right frontal lobe lesion. Elsewhere stable gray and white matter signal since March including patchy posterior hemisphere and central pontine T2 and FLAIR hyperintensity. No other cortical encephalomalacia or chronic  cerebral blood products identified. Right basal ganglia and thalami remain within normal limits. Questionable tiny chronic infarct in the posterior right cerebellum on series 8, image 7. No midline shift, mass effect, evidence of mass lesion, ventriculomegaly, extra-axial collection or acute intracranial hemorrhage. Cervicomedullary junction and pituitary are within normal limits. Vascular: Major intracranial vascular flow voids are stable since March. Mild generalized intracranial artery tortuosity. Skull and upper cervical spine: Stable and negative. Sinuses/Orbits: Stable and negative. Other: Stable hypertrophied adenoid soft tissue with no suspicious features. Mastoids and tympanic cavities remain clear. Negative scalp and face soft tissues. IMPRESSION: 1. 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 2. No superimposed acute infarct identified. And otherwise stable chronic ischemia, with a combination of bilateral small vessel disease and medium size vessel involvement in the left MCA territory. Electronically Signed   By: Odessa Fleming M.D.   On: 06/04/2019 18:22    Pending Labs Unresulted Labs (From admission, onward)   None      Vitals/Pain Today's Vitals   06/04/19 2043 06/04/19 2130 06/04/19 2200 06/04/19 2229  BP:  (!) 159/88 (!) 168/94   Pulse:  (!) 37 (!) 57   Resp:   18   Temp:      TempSrc:      SpO2:  93% 95%   PainSc: Asleep   6     Isolation Precautions No active isolations  Medications Medications  hydrALAZINE (APRESOLINE) injection 20 mg (  0 mg Intravenous Hold 06/04/19 1414)  aspirin chewable tablet 81 mg (has no administration in time range)  lisinopril (ZESTRIL) tablet 20 mg (has no administration in time range)  atorvastatin (LIPITOR) tablet 20 mg (has no administration in time range)  furosemide (LASIX) tablet 20 mg (has no administration in time range)  sodium chloride 0.9 % bolus 500 mL (0 mLs Intravenous Stopped 06/04/19  0139)  iohexol (OMNIPAQUE) 350 MG/ML injection 100 mL (100 mLs Intravenous Contrast Given 06/04/19 0116)  sodium chloride (PF) 0.9 % injection (  Given 06/04/19 0139)  ketorolac (TORADOL) 30 MG/ML injection 30 mg (30 mg Intravenous Given 06/04/19 0147)  prochlorperazine (COMPAZINE) injection 10 mg (10 mg Intravenous Given 06/04/19 0149)  diphenhydrAMINE (BENADRYL) injection 25 mg (25 mg Intravenous Given 06/04/19 0147)  acetaminophen (TYLENOL) tablet 1,000 mg (1,000 mg Oral Given 06/04/19 1152)  tetracaine (PONTOCAINE) 0.5 % ophthalmic solution 2 drop (2 drops Both Eyes Given 06/04/19 1152)  ketorolac (TORADOL) 15 MG/ML injection 15 mg (15 mg Intravenous Given 06/04/19 1927)  HYDROmorphone (DILAUDID) injection 0.5 mg (0.5 mg Intravenous Given 06/04/19 2116)  HYDROmorphone (DILAUDID) injection 1 mg (1 mg Intravenous Given 06/04/19 2309)    Mobility walks with person assist

## 2019-06-04 NOTE — H&P (Signed)
PCP:   System, Pcp Not In   Chief Complaint: Headache and dizziness  HPI: This a 54 year old male with a history of hypertension well-controlled.  He presents with complaint of a severe headache that developed yesterday and has persisted.  He states he feels as though someone is pulling his right upper socket.  He reports his hands and feet have been numb for couple weeks.  He also relates dizziness.  His dizziness is worse on standing but it persists after he stands.  He states he has been in bed most of the week because he is so frequently off balance..  Reports feeling frequently presyncopal.  He has never passed out.  As stated he has history of hypertension but states is well controlled.  He checks his blood pressure weekly reports it is typically systolics of 120s to 130s.  He denies any new medication of any over-the-counter medication.  He reports seasonal allergies for which she is taking no medications.  He reports symptoms of itchy skin, headaches, swollen eyes.  He has a inhaler but he has not used.  He has never had associated dizziness.  He reports some sinus congestion but denies any fevers or chills.  He states his right ear feels congested.  He denies tinnitus he denies postnasal drip he denies hearing loss.  During my interview he complains of significant pain in his back tooth on the left side.  He states the pain radiates from the head around the tooth.  He states he has had a toothache approximately 2 days.  He states the left side of his face feels congested swollen.   In the ER the patient has had an extensive work-up including CTA head and neck, MRI, covid testing.  His MRI significant for a chronic stroke that has been present after March 2021.  His UDS is positive for marijuana.  He has received extensive medication for his headache including Benadryl, Dilaudid, Toradol, Compazine, none of which seem to have improved his headache except for the 0.5 mg IV Dilaudid.  Hospitalist  have been asked to admit for treatment of intractable headache.  Review of Systems:  The patient reports anorexia, fever, weight loss,, vision loss, decreased hearing, hoarseness, chest pain, syncope, dyspnea on exertion, peripheral edema, balance deficits, hemoptysis, abdominal pain, melena, hematochezia, severe indigestion/heartburn, hematuria, incontinence, genital sores, muscle weakness, suspicious skin lesions, transient blindness, difficulty walking, depression, unusual weight change, abnormal bleeding, enlarged lymph nodes, angioedema, and breast masses.  Past Medical History: Past Medical History:  Diagnosis Date  . Essential hypertension 10/24/2014  . Family history of premature CAD   . NICM (nonischemic cardiomyopathy) (HCC)    a. 10/2014 Cath: nl cors, EF 35-45%, glob HK.  . Obesity (BMI 30-39.9)    Past Surgical History:  Procedure Laterality Date  . CARDIAC CATHETERIZATION N/A 10/25/2014   Procedure: Left Heart Cath and Coronary Angiography;  Surgeon: Lyn Records, MD;  Location: [facility] INVASIVE CV LAB;  Service: Cardiovascular;  Laterality: N/A;  . None      Medications: Prior to Admission medications   Medication Sig Start Date End Date Taking? Authorizing Provider  albuterol (VENTOLIN HFA) 108 (90 Base) MCG/ACT inhaler Inhale 2 puffs into the lungs every 6 (six) hours as needed for wheezing or shortness of breath.   Yes [provider]  aspirin 81 MG chewable tablet Chew 1 tablet (81 mg total) by mouth daily. 03/19/19  Yes Hunter, [place], MD  atorvastatin (LIPITOR) 20 MG tablet Take 20 mg by  mouth daily. 02/28/19  Yes [provider]  furosemide (LASIX) 20 MG tablet Take 1 tablet (20 mg total) by mouth daily. 10/05/18  Yes Pollina, Canary Brim, MD  lisinopril (ZESTRIL) 20 MG tablet Take 1 tablet (20 mg total) by mouth daily. 10/05/18  Yes Pollina, Canary Brim, MD  potassium chloride SA (K-DUR) 10 MEQ tablet Take 2 tablets (20 mEq total) by mouth daily. 10/05/18   Yes Gilda Crease, MD  sildenafil (VIAGRA) 50 MG tablet Take 50 mg by mouth every Monday, Wednesday, and Friday. 03/16/19  Yes [provider]  lidocaine (LIDODERM) 5 % Place 1 patch onto the skin daily. Remove & Discard patch within 12 hours or as directed by MD Patient not taking: Reported on 03/19/2019 10/15/18   Berton Mount I, MD  traMADol (ULTRAM) 50 MG tablet Take 1 tablet (50 mg total) by mouth every 6 (six) hours as needed. Patient not taking: Reported on 10/12/2018 10/05/18   Gilda Crease, MD  famotidine (PEPCID) 20 MG tablet Take 1 tablet (20 mg total) by mouth 2 (two) times daily. Patient not taking: Reported on 10/05/2018 08/26/16 10/05/18  Joseph Art, DO  hydrochlorothiazide (MICROZIDE) 12.5 MG capsule Take 1 capsule (12.5 mg total) by mouth daily. Patient not taking: Reported on 10/05/2018 08/27/16 10/05/18  Joseph Art, DO    Allergies:   Allergies  Allergen Reactions  . Nyquil Multi-Symptom [Pseudoeph-Doxylamine-Dm-Apap] Nausea And Vomiting    Social History:  reports that he quit smoking about 3 years ago. His smoking use included cigarettes. He has a 15.00 pack-year smoking history. He has never used smokeless tobacco. He reports current drug use. Drug: Marijuana. He reports that he does not drink alcohol.  Family History: Family History  Problem Relation Age of Onset  . Heart attack Sister 53       Died in her sleep, dx at autopsy  . Heart attack Cousin 1       Died in his sleep    Physical Exam: Vitals:   06/04/19 1630 06/04/19 1643 06/04/19 1832 06/04/19 2030  BP: (!) 164/110 (!) 164/110 (!) 151/97 (!) 147/77  Pulse: (!) 46 (!) 59 62 (!) 51  Resp: 14 13 16 17   Temp:  97.7 F (36.5 C)    TempSrc:  Axillary    SpO2: 94%  95% 94%    General:  Alert and oriented times three, well developed and nourished, no acute distress Eyes: PERRLA, pink conjunctiva, no scleral icterus ENT: Moist oral mucosa, neck supple, no thyromegaly,  TTP of left molar tooth, TMJ TTP left, left submandibular TTP, no swelling noted. Ear clear B/L Lungs: clear to ascultation, no wheeze, no crackles, no use of accessory muscles Cardiovascular: regular rate and rhythm, no regurgitation, no gallops, no murmurs. No carotid bruits, no JVD Abdomen: soft, positive BS, non-tender, non-distended, no organomegaly, not an acute abdomen GU: not examined Neuro: CN II - XII grossly intact, sensation intact Musculoskeletal: strength 5/5 all extremities, no clubbing, cyanosis or edema Skin: no rash, no subcutaneous crepitation, no decubitus Psych: appropriate patient   Labs on Admission:  Recent Labs    06/04/19 0007  NA 138  K 4.0  CL 102  CO2 28  GLUCOSE 108*  BUN 13  CREATININE 1.06  CALCIUM 9.0   Recent Labs    06/04/19 1241  AST 23  ALT 15  ALKPHOS 70  BILITOT 1.3*  PROT 6.5  ALBUMIN 3.3*   No results for input(s): LIPASE, AMYLASE in the  last 72 hours. Recent Labs    06/04/19 0007  WBC 6.8  NEUTROABS 3.5  HGB 16.2  HCT 50.0  MCV 85.9  PLT 176   No results for input(s): CKTOTAL, CKMB, CKMBINDEX, TROPONINI in the last 72 hours. Invalid input(s): POCBNP No results for input(s): DDIMER in the last 72 hours. No results for input(s): HGBA1C in the last 72 hours. No results for input(s): CHOL, HDL, LDLCALC, TRIG, CHOLHDL, LDLDIRECT in the last 72 hours. Recent Labs    06/04/19 1217  TSH 0.995   No results for input(s): VITAMINB12, FOLATE, FERRITIN, TIBC, IRON, RETICCTPCT in the last 72 hours.  Micro Results: Recent Results (from the past 240 hour(s))  SARS Coronavirus 2 by RT PCR (hospital order, performed in [facility] hospital lab) Nasopharyngeal Nasopharyngeal Swab     Status: None   Collection Time: 06/04/19  7:24 PM   Specimen: Nasopharyngeal Swab  Result Value Ref Range Status   SARS Coronavirus 2 NEGATIVE NEGATIVE Final    Comment: (NOTE) SARS-CoV-2 target nucleic acids are NOT DETECTED. The SARS-CoV-2 RNA is  generally detectable in upper and lower respiratory specimens during the acute phase of infection. The lowest concentration of SARS-CoV-2 viral copies this assay can detect is 250 copies / mL. A negative result does not preclude SARS-CoV-2 infection and should not be used as the sole basis for treatment or other patient management decisions.  A negative result may occur with improper specimen collection / handling, submission of specimen other than nasopharyngeal swab, presence of viral mutation(s) within the areas targeted by this assay, and inadequate number of viral copies (<250 copies / mL). A negative result must be combined with clinical observations, patient history, and epidemiological information. Fact Sheet for Patients:   StrictlyIdeas.no Fact Sheet for Healthcare Providers: BankingDealers.co.za This test is not yet approved or cleared  by the [place] FDA and has been authorized for detection and/or diagnosis of SARS-CoV-2 by FDA under an Emergency Use Authorization (EUA).  This EUA will remain in effect (meaning this test can be used) for the duration of the COVID-19 declaration under Section 564(b)(1) of the Act, 21 U.S.C. section 360bbb-3(b)(1), unless the authorization is terminated or revoked sooner. Performed at [facility], [place] [address], [place], [place] [address]      Radiological Exams on Admission: CT Angio Head W or Wo Contrast  Result Date: 06/04/2019 CLINICAL DATA:  Headache with pain behind the right eye EXAM: CT ANGIOGRAPHY HEAD TECHNIQUE: Multidetector CT imaging of the head was performed using the standard protocol during bolus administration of intravenous contrast. Multiplanar CT image reconstructions and MIPs were obtained to evaluate the vascular anatomy. CONTRAST:  158mL OMNIPAQUE IOHEXOL 350 MG/ML SOLN COMPARISON:  08/23/2016 head CT FINDINGS: CT HEAD Brain: There is an old left  caudate small vessel infarct. No acute hemorrhage or extra-axial collection. Brain parenchyma otherwise normal. Vascular: No abnormal hyperdensity of the major intracranial arteries or dural venous sinuses. No intracranial atherosclerosis. Skull: The visualized skull base, calvarium and extracranial soft tissues are normal. Sinuses/Orbits: No fluid levels or advanced mucosal thickening of the visualized paranasal sinuses. No mastoid or middle ear effusion. The orbits are normal. CTA HEAD POSTERIOR CIRCULATION: --Vertebral arteries: Normal V4 segments. --Inferior cerebellar arteries: Normal. --Basilar artery: Normal. --Superior cerebellar arteries: Normal. --Posterior cerebral arteries: Normal. ANTERIOR CIRCULATION: --Intracranial internal carotid arteries: Normal. --Anterior cerebral arteries (ACA): Normal. Both A1 segments are present. Patent anterior communicating artery (a-comm). --Middle cerebral arteries (MCA): Normal. Venous sinuses: As permitted by  contrast timing, patent. Anatomic variants: None IMPRESSION: 1. No intracranial arterial occlusion or high-grade stenosis. 2. No aneurysm or subarachnoid hemorrhage. 3. 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 Electronically Signed   By: Deatra Robinson M.D.   On: 06/04/2019 01:56   MR BRAIN WO CONTRAST  Result Date: 06/04/2019 CLINICAL DATA:  54 year old male with unexplained altered mental status. Headache with pain behind the right eye. EXAM: MRI HEAD WITHOUT CONTRAST TECHNIQUE: Multiplanar, multiecho pulse sequences of the brain and surrounding structures were obtained without intravenous contrast. COMPARISON:  CT and CTA head earlier today.  Brain MRI 03/19/2019. FINDINGS: Brain: No restricted diffusion or evidence of acute infarction. Chronic ischemia with encephalomalacia in the left middle frontal gyrus, corona radiata, left lentiform. However, contralateral T2 and FLAIR hyperintense linear lesion of the anterior right frontal lobe white matter is new from  03/19/2019 but most resembles a chronic lacunar infarct (series 8, image 17, series 11, image 35, with facilitated diffusion). Chronic hemosiderin associated with the former, but trace if any microhemorrhage in the new or right frontal lobe lesion. Elsewhere stable gray and white matter signal since March including patchy posterior hemisphere and central pontine T2 and FLAIR hyperintensity. No other cortical encephalomalacia or chronic cerebral blood products identified. Right basal ganglia and thalami remain within normal limits. Questionable tiny chronic infarct in the posterior right cerebellum on series 8, image 7. No midline shift, mass effect, evidence of mass lesion, ventriculomegaly, extra-axial collection or acute intracranial hemorrhage. Cervicomedullary junction and pituitary are within normal limits. Vascular: Major intracranial vascular flow voids are stable since March. Mild generalized intracranial artery tortuosity. Skull and upper cervical spine: Stable and negative. Sinuses/Orbits: Stable and negative. Other: Stable hypertrophied adenoid soft tissue with no suspicious features. Mastoids and tympanic cavities remain clear. Negative scalp and face soft tissues. IMPRESSION: 1. 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 2. No superimposed acute infarct identified. And otherwise stable chronic ischemia, with a combination of bilateral small vessel disease and medium size vessel involvement in the left MCA territory. Electronically Signed   By: Odessa Fleming M.D.   On: 06/04/2019 18:22    Assessment/Plan Present on Admission: . Intractable headache -Bring for overnight observation on MedSurg -Multifactorial etiology including seasonal allergies, musculoskeletal from dental caries, possible CVA related -Patient denies that he does need to see a dentist after discharge -We will also treat with scheduled Toradol and Reglan.  Phenergan and morphine as  needed -Single dose Decadron and Claritin given  Dizziness -May be related to recent CVA and/or seasonal allergies -  . Recent CVA (cerebral vascular accident) (HCC) -Continue aspirin, increase to 325 mg.  Lipid panel -Neurochecks -Defer to a.m. team if neuro consult needed  . Chronic systolic CHF (congestive heart failure) (HCC) Nonischemic cardiomyopathy -Stable, home meds resumed  . Essential hypertension -Stable, home meds resumed  Dyslipidemia Stable, home meds resumed  Kewana Sanon 06/04/2019, 9:55 PM

## 2019-06-04 NOTE — ED Notes (Signed)
Admitting MD at bedside.

## 2019-06-04 NOTE — ED Provider Notes (Addendum)
Got Compazine for H/A. W/U neg. Recheck. Anticipate D/c.  Patient reports has had a headache for a few days it feels like his eyes getting pulled out of his socket.  He seemed to indicate more on the left side.  He reports to get severe pain in the center of his head behind his nose and eyes.  Patient reports he has been off balance for a while. Physical Exam  BP 137/89 (BP Location: Left Arm)   Pulse (!) 51   Temp 98.2 F (36.8 C) (Oral)   Resp 16   SpO2 99%   Physical Exam Patient will awaken to moderate tactile stimulus.  Once awake he is oriented to person and place.  No focal motor deficits.  Patient can with assistance get up to seated position at bedside.  Patient however remains somnolent and is soon as examination interaction is finished he is going back to sleep again. ED Course/Procedures     Procedures tonopen Right:28, 29 Left:23, 25 MDM  Patient remains somnolent after several rechecks.  He will awaken and speak with me in oriented fashion.  He is situationally oriented and can give history.  He however falls back asleep quite quickly and then has deep sonorous respirations.  Appearance and pattern are suggestive of sleep apnea.  Patient's heart rate is sinus ranging from mid 50s to mid 60s.  Occasionally heart rate is down to mid 40s.   Patient had been placed on oxygen.  I have removed oxygen.  He has a respiratory pattern of sleep apnea.  He sometimes will go up to 18 seconds without taking a breath.  O2 sat might drift as low as 85 but immediately rebounds as soon as the patient picks up his respirations again.  We will add ammonia, ABG, TSH and urine drug screen.  Unremarkable sed rate, CRP, TSH, ammonia no hypercapnia.  Consult: Reviewed with neurology Dr. Jerrell Belfast.  He advises MRI of the brain to rule out CVA.  He does not feel that spinal tap will be of additional utility in this patient.  MRI brain ordered. Dr. Stevie Kern to f/u MRI.       Arby Barrette,  MD 06/04/19 2248    Arby Barrette, MD 06/04/19 1517

## 2019-06-04 NOTE — ED Provider Notes (Signed)
54 year old male presenting to ER with concern for severe headache.  Patient has received multiple medications for his headache symptoms, has undergone extensive work-up including CT angio head, labs including sed rate, CRP, TSH, ammonia.  Case was reviewed with Dr. Jerrell Belfast who had recommended MRI brain rule out CVA, do not feel LP necessary.  Given patient reported headache was worse behind his left eye, Dr. Clarice Pole performed Tono-Pen, both were slightly elevated but left side was actually less than right and only slightly above typical normal range.   3:30 PM received sign out from [place] - dispo pending MRI, reassessment  8:00 PM reviewed MRI, updated patient, reviewed with [place] - from stroke stand point, pt can be managed out pt, rec increase to full ASA daily; pt still complaining of severe headache, will give additional pain meds and reassess  9:15 PM rechecked, symptoms still ongoing, will c/s hosp for admission for pain control   Milagros Loll, MD 06/04/19 2117

## 2019-06-04 NOTE — ED Provider Notes (Signed)
[facility] DEPT Provider Note   CSN: [id number] Arrival date & time: 06/03/19  2315     History Chief Complaint  Patient presents with  . Headache    Kenneth Gould is a 54 y.o. male.  Patient presents to the emergency department for evaluation of headache.  He reports that he has been experiencing frequent headaches recently, but normally does not get a lot of headaches.  He had a headache that started yesterday that has progressively worsened that brought him to the ER tonight.  He has a sharp pain behind the left eye and into the left side of his head.  He feels off balance.        Past Medical History:  Diagnosis Date  . Essential hypertension 10/24/2014  . Family history of premature CAD   . NICM (nonischemic cardiomyopathy) ([place])    a. 10/2014 Cath: nl cors, EF 35-45%, glob HK.  . Obesity (BMI 30-39.9)     Patient Active Problem List   Diagnosis Date Noted  . Congestive heart failure (CHF) ([place]) 10/12/2018  . Atypical chest pain 08/24/2016  . Chronic systolic CHF (congestive heart failure) ([place])   . Chest pain   . Unstable angina pectoris ([place]) 10/25/2014  . NICM (nonischemic cardiomyopathy) ([place]) 10/25/2014  . Chest pain with moderate risk for cardiac etiology 10/24/2014  . Essential hypertension 10/24/2014  . Chest pain, moderate coronary artery risk 10/24/2014    Past Surgical History:  Procedure Laterality Date  . CARDIAC CATHETERIZATION N/A 10/25/2014   Procedure: Left Heart Cath and Coronary Angiography;  Surgeon: Belva Crome, MD;  Location: [facility];  Service: Cardiovascular;  Laterality: N/A;  . None         Family History  Problem Relation Age of Onset  . Heart attack Sister 87       Died in her sleep, dx at autopsy  . Heart attack Cousin 50       Died in his sleep    Social History   Tobacco Use  . Smoking status: Former Smoker    Packs/day: 0.50    Years: 30.00    Pack years: 15.00   Types: Cigarettes    Quit date: 08/25/2015    Years since quitting: 3.7  . Smokeless tobacco: Never Used  Substance Use Topics  . Alcohol use: No  . Drug use: Yes    Types: Marijuana    Comment: THC    Home Medications Prior to Admission medications   Medication Sig Start Date End Date Taking? Authorizing Provider  albuterol (VENTOLIN HFA) 108 (90 Base) MCG/ACT inhaler Inhale 2 puffs into the lungs every 6 (six) hours as needed for wheezing or shortness of breath.    [provider]  aspirin 81 MG chewable tablet Chew 1 tablet (81 mg total) by mouth daily. 03/19/19   Hunter, [place], MD  atorvastatin (LIPITOR) 20 MG tablet Take 20 mg by mouth daily. 02/28/19   [provider]  furosemide (LASIX) 20 MG tablet Take 1 tablet (20 mg total) by mouth daily. 10/05/18   Orpah Greek, MD  lidocaine (LIDODERM) 5 % Place 1 patch onto the skin daily. Remove & Discard patch within 12 hours or as directed by MD Patient not taking: Reported on 03/19/2019 10/15/18   Dana Allan I, MD  lisinopril (ZESTRIL) 20 MG tablet Take 1 tablet (20 mg total) by mouth daily. 10/05/18   Orpah Greek, MD  potassium chloride SA (K-DUR) 10 MEQ  tablet Take 2 tablets (20 mEq total) by mouth daily. 10/05/18   Gilda Crease, MD  sildenafil (VIAGRA) 50 MG tablet Take 50 mg by mouth every Monday, Wednesday, and Friday. 03/16/19   [provider]  traMADol (ULTRAM) 50 MG tablet Take 1 tablet (50 mg total) by mouth every 6 (six) hours as needed. Patient not taking: Reported on 10/12/2018 10/05/18   Gilda Crease, MD  famotidine (PEPCID) 20 MG tablet Take 1 tablet (20 mg total) by mouth 2 (two) times daily. Patient not taking: Reported on 10/05/2018 08/26/16 10/05/18  Joseph Art, DO  hydrochlorothiazide (MICROZIDE) 12.5 MG capsule Take 1 capsule (12.5 mg total) by mouth daily. Patient not taking: Reported on 10/05/2018 08/27/16 10/05/18  Joseph Art, DO    Allergies      Nyquil multi-symptom [pseudoeph-doxylamine-dm-apap]  Review of Systems   Review of Systems  Neurological: Positive for dizziness and headaches.  All other systems reviewed and are negative.   Physical Exam Updated Vital Signs BP (!) 149/91 (BP Location: Left Arm)   Pulse (!) 56   Temp 98.2 F (36.8 C) (Oral)   Resp 16   SpO2 100%   Physical Exam Vitals and nursing note reviewed.  Constitutional:      General: He is not in acute distress.    Appearance: Normal appearance. He is well-developed.  HENT:     Head: Normocephalic and atraumatic.     Right Ear: Hearing normal.     Left Ear: Hearing normal.     Nose: Nose normal.  Eyes:     Conjunctiva/sclera: Conjunctivae normal.     Pupils: Pupils are equal, round, and reactive to light.  Cardiovascular:     Rate and Rhythm: Regular rhythm.     Heart sounds: S1 normal and S2 normal. No murmur. No friction rub. No gallop.   Pulmonary:     Effort: Pulmonary effort is normal. No respiratory distress.     Breath sounds: Normal breath sounds.  Chest:     Chest wall: No tenderness.  Abdominal:     General: Bowel sounds are normal.     Palpations: Abdomen is soft.     Tenderness: There is no abdominal tenderness. There is no guarding or rebound. Negative signs include Murphy's sign and McBurney's sign.     Hernia: No hernia is present.  Musculoskeletal:        General: Normal range of motion.     Cervical back: Normal range of motion and neck supple.  Skin:    General: Skin is warm and dry.     Findings: No rash.  Neurological:     Mental Status: He is alert and oriented to person, place, and time.     GCS: GCS eye subscore is 4. GCS verbal subscore is 5. GCS motor subscore is 6.     Cranial Nerves: No cranial nerve deficit.     Sensory: No sensory deficit.     Coordination: Coordination normal.  Psychiatric:        Speech: Speech normal.        Behavior: Behavior normal.        Thought Content: Thought content normal.      ED Results / Procedures / Treatments   Labs (all labs ordered are listed, but only abnormal results are displayed) Labs Reviewed  CBC WITH DIFFERENTIAL/PLATELET - Abnormal; Notable for the following components:      Result Value   RBC 5.82 (*)    All  other components within normal limits  BASIC METABOLIC PANEL - Abnormal; Notable for the following components:   Glucose, Bld 108 (*)    All other components within normal limits    EKG None  Radiology CT Angio Head W or Wo Contrast  Result Date: 06/04/2019 CLINICAL DATA:  Headache with pain behind the right eye EXAM: CT ANGIOGRAPHY HEAD TECHNIQUE: Multidetector CT imaging of the head was performed using the standard protocol during bolus administration of intravenous contrast. Multiplanar CT image reconstructions and MIPs were obtained to evaluate the vascular anatomy. 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 No acute hemorrhage or extra-axial collection. Brain parenchyma otherwise normal. Vascular: No abnormal hyperdensity of the major intracranial arteries or dural venous sinuses. No intracranial atherosclerosis. Skull: The visualized skull base, calvarium and extracranial soft tissues are normal. Sinuses/Orbits: No fluid levels or advanced mucosal thickening of the visualized paranasal sinuses. No mastoid or middle ear effusion. The orbits are normal. CTA HEAD POSTERIOR CIRCULATION: --Vertebral arteries: Normal V4 segments. --Inferior cerebellar arteries: Normal. --Basilar artery: Normal. --Superior cerebellar arteries: Normal. --Posterior cerebral arteries: Normal. ANTERIOR CIRCULATION: --Intracranial internal carotid arteries: Normal. --Anterior cerebral arteries (ACA): Normal. Both A1 segments are present. Patent anterior communicating artery (a-comm). --Middle cerebral arteries (MCA): Normal. Venous sinuses: As permitted  by contrast timing, patent. Anatomic variants: None IMPRESSION: 1. No intracranial arterial occlusion or high-grade stenosis. 2. No aneurysm or subarachnoid hemorrhage. 3. 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 Electronically Signed   By: Deatra Robinson M.D.   On: 06/04/2019 01:56    Procedures Procedures (including critical care time)  Medications Ordered in ED Medications  sodium chloride 0.9 % bolus 500 mL (0 mLs Intravenous Stopped 06/04/19 0139)  iohexol (OMNIPAQUE) 350 MG/ML injection 100 mL (100 mLs Intravenous Contrast Given 06/04/19 0116)  sodium chloride (PF) 0.9 % injection (  Given 06/04/19 0139)  ketorolac (TORADOL) 30 MG/ML injection 30 mg (30 mg Intravenous Given 06/04/19 0147)  prochlorperazine (COMPAZINE) injection 10 mg (10 mg Intravenous Given 06/04/19 0149)  diphenhydrAMINE (BENADRYL) injection 25 mg (25 mg Intravenous Given 06/04/19 0147)    ED Course  I have reviewed the triage vital signs and the nursing notes.  Pertinent labs & imaging results that were available during my care of the patient were reviewed by me and considered in my medical decision making (see chart for details).    MDM Rules/Calculators/A&P                      Patient reports that he has been having frequent headaches recently but does not normally suffer headaches.  Patient complaining of a moderate to severe headache that is focused around the left eye and into the left side of his head.  Symptoms worsened over the last 24 hours.  He has normal neurologic function upon arrival.  CT angio head does not show any acute abnormality, there is an old infarct noted.  Remainder of work-up unremarkable.  Patient treated with Toradol, Compazine, Benadryl with some improvement of his headache.  Will discharge and follow-up with neurology.  Final Clinical Impression(s) / ED Diagnoses Final diagnoses:  Bad headache    Rx / DC Orders ED Discharge Orders    None       Kelvin Sennett, Canary Brim, MD 06/04/19  [phone]

## 2019-06-04 NOTE — Discharge Instructions (Addendum)
Please schedule follow-up with one of the listed neurology groups to have a reevaluation and treatment of your headaches.

## 2019-06-05 ENCOUNTER — Observation Stay (HOSPITAL_BASED_OUTPATIENT_CLINIC_OR_DEPARTMENT_OTHER): Payer: Managed Care, Other (non HMO)

## 2019-06-05 ENCOUNTER — Observation Stay (HOSPITAL_COMMUNITY): Payer: Managed Care, Other (non HMO)

## 2019-06-05 DIAGNOSIS — I6389 Other cerebral infarction: Secondary | ICD-10-CM | POA: Diagnosis not present

## 2019-06-05 DIAGNOSIS — Z9189 Other specified personal risk factors, not elsewhere classified: Secondary | ICD-10-CM

## 2019-06-05 DIAGNOSIS — I10 Essential (primary) hypertension: Secondary | ICD-10-CM

## 2019-06-05 DIAGNOSIS — I5022 Chronic systolic (congestive) heart failure: Secondary | ICD-10-CM | POA: Diagnosis not present

## 2019-06-05 DIAGNOSIS — I428 Other cardiomyopathies: Secondary | ICD-10-CM | POA: Diagnosis not present

## 2019-06-05 LAB — LIPID PANEL
Cholesterol: 204 mg/dL — ABNORMAL HIGH (ref 0–200)
HDL: 45 mg/dL (ref >40)
LDL Cholesterol: 151 mg/dL — ABNORMAL HIGH (ref 0–99)
Total CHOL/HDL Ratio: 4.5 RATIO
Triglycerides: 42 mg/dL (ref <150)
VLDL: 8 mg/dL (ref 0–40)

## 2019-06-05 LAB — CBC
HCT: 54 % — ABNORMAL HIGH (ref 39.0–52.0)
Hemoglobin: 17.4 g/dL — ABNORMAL HIGH (ref 13.0–17.0)
MCH: 28 pg (ref 26.0–34.0)
MCHC: 32.2 g/dL (ref 30.0–36.0)
MCV: 86.8 fL (ref 80.0–100.0)
Platelets: 171 10*3/uL (ref 150–400)
RBC: 6.22 MIL/uL — ABNORMAL HIGH (ref 4.22–5.81)
RDW: 15.3 % (ref 11.5–15.5)
WBC: 6.8 10*3/uL (ref 4.0–10.5)
nRBC: 0 % (ref 0.0–0.2)

## 2019-06-05 LAB — BASIC METABOLIC PANEL
Anion gap: 10 (ref 5–15)
BUN: 14 mg/dL (ref 6–20)
CO2: 25 mmol/L (ref 22–32)
Calcium: 8.9 mg/dL (ref 8.9–10.3)
Chloride: 100 mmol/L (ref 98–111)
Creatinine, Ser: 1.15 mg/dL (ref 0.61–1.24)
GFR calc Af Amer: >60 mL/min (ref >60)
GFR calc non Af Amer: >60 mL/min (ref >60)
Glucose, Bld: 196 mg/dL — ABNORMAL HIGH (ref 70–99)
Potassium: 4.6 mmol/L (ref 3.5–5.1)
Sodium: 135 mmol/L (ref 135–145)

## 2019-06-05 LAB — FOLATE: Folate: 8.5 ng/mL (ref >5.9)

## 2019-06-05 LAB — GLUCOSE, CAPILLARY: Glucose-Capillary: 119 mg/dL — ABNORMAL HIGH (ref 70–99)

## 2019-06-05 LAB — VITAMIN B12: Vitamin B-12: 97 pg/mL — ABNORMAL LOW (ref 180–914)

## 2019-06-05 LAB — SEDIMENTATION RATE: Sed Rate: 0 mm/hr (ref 0–16)

## 2019-06-05 LAB — ECHOCARDIOGRAM COMPLETE

## 2019-06-05 LAB — C-REACTIVE PROTEIN: CRP: 0.6 mg/dL (ref <1.0)

## 2019-06-05 MED ORDER — SODIUM CHLORIDE 0.9 % IV SOLN
500.0000 mg | Freq: Once | INTRAVENOUS | Status: AC
  Administered 2019-06-05: 500 mg via INTRAVENOUS
  Filled 2019-06-05: qty 4

## 2019-06-05 MED ORDER — INSULIN ASPART 100 UNIT/ML ~~LOC~~ SOLN
0.0000 [IU] | Freq: Three times a day (TID) | SUBCUTANEOUS | Status: DC
  Administered 2019-06-06: 3 [IU] via SUBCUTANEOUS
  Administered 2019-06-06: 2 [IU] via SUBCUTANEOUS

## 2019-06-05 MED ORDER — GADOBUTROL 1 MMOL/ML IV SOLN
10.0000 mL | Freq: Once | INTRAVENOUS | Status: AC | PRN
  Administered 2019-06-05: 10 mL via INTRAVENOUS

## 2019-06-05 MED ORDER — DEXAMETHASONE SODIUM PHOSPHATE 10 MG/ML IJ SOLN
6.0000 mg | Freq: Once | INTRAMUSCULAR | Status: DC
  Filled 2019-06-05: qty 1

## 2019-06-05 MED ORDER — BUTALBITAL-APAP-CAFFEINE 50-325-40 MG PO TABS
1.0000 | ORAL_TABLET | Freq: Four times a day (QID) | ORAL | Status: DC | PRN
  Administered 2019-06-05 – 2019-06-06 (×2): 1 via ORAL
  Filled 2019-06-05 (×2): qty 1

## 2019-06-05 MED ORDER — MECLIZINE HCL 25 MG PO TABS
25.0000 mg | ORAL_TABLET | Freq: Two times a day (BID) | ORAL | Status: DC
  Administered 2019-06-05 – 2019-06-06 (×3): 25 mg via ORAL
  Filled 2019-06-05 (×3): qty 1

## 2019-06-05 MED ORDER — AMOXICILLIN-POT CLAVULANATE 875-125 MG PO TABS
1.0000 | ORAL_TABLET | Freq: Two times a day (BID) | ORAL | Status: DC
  Administered 2019-06-05 – 2019-06-06 (×3): 1 via ORAL
  Filled 2019-06-05 (×3): qty 1

## 2019-06-05 NOTE — Plan of Care (Signed)
  Problem: Activity: Goal: Risk for activity intolerance will decrease Outcome: Progressing   Problem: Coping: Goal: Level of anxiety will decrease Outcome: Progressing   Problem: Clinical Measurements: Goal: Will remain free from infection Outcome: Progressing   Problem: Pain Managment: Goal: General experience of comfort will improve Outcome: Progressing   Problem: Safety: Goal: Ability to remain free from injury will improve Outcome: Progressing

## 2019-06-05 NOTE — Progress Notes (Addendum)
Orthostatic VS  Lying 143/92 HR 60 Sitting 143/99 HR 66 Standing 147/98 HR 67  Patient was dizzy while sitting and standing. He couldn't get the 3 min standing VS due to dizziness.  Secure chat sent to Dr. Lajuana Ripple.

## 2019-06-05 NOTE — Progress Notes (Signed)
  Echocardiogram 2D Echocardiogram has been performed.  Del Overfelt G Samika Vetsch 06/05/2019, 10:15 AM

## 2019-06-05 NOTE — Progress Notes (Addendum)
PROGRESS NOTE    Kenneth Gould  UXN:[id number]  DOB: [birth year]  PCP: System, Pcp Not In Admit date:06/03/2019 54 year old male with a history of well-controlled HTN, hyperlipidemia, NICM /chronic systolic CHF presents with complaint of a severe persistent headache x 1 day - feels as though someone is pulling his right upper socket.  He reports his hands and feet have been numb for couple weeks.  He also reports dizziness- worse on standing, been in bed most of the week because he is so frequently off balance/presyncopal.  He has never passed out.He reports some sinus and right ear congestion but denies any fevers or chills.He denies tinnitus, postnasal drip or hearing loss. During hospitalist interview, he complained of significant  Toothache-  back tooth on the left side x 2 days.  He states the pain radiates from the head around the tooth and left side of his face feels congested swollen.  ED Course: Afebrile, patient had an extensive work-up including CTA head and neck, MRI, covid testing. MRI significant for a chronic stroke that has been present after March 2021.  His UDS is positive for marijuana. He received Benadryl, Dilaudid, Toradol, Compazine, none of which seem to have improved his headache except for the 0.5 mg IV Dilaudid.  Hospital course: Patient admitted to [facility] for further evaluation and management .  Subjective: Patient accompanied by friend at bedside. He reports headache 10/10 , mostly frontal and left perietal/temporal throbbing in nature associated with facial pain/jaw pain . He also reports transient vision impairment yesterday and had to close his eye. He states he has had chronic back issues, peripheral neuropathy/numbness due to chronic LE edema from heart failure h/o but denies any focal numbness /weakness associated with headache. Denies nausea/vomiting. Reports feeling ataxic and dizzy over last few days but denies spinning sensation.   Objective: Vitals:   06/04/19  2200 06/04/19 2337 06/04/19 2347 06/05/19 0359  BP: (!) 168/94 (!) 160/103 (!) 159/105 (!) 137/98  Pulse: (!) 57 (!) 50 (!) 52 (!) 54  Resp: '18 20 20 16  ' Temp:  98.6 F (37 C)  98.6 F (37 C)  TempSrc:  Oral  Oral  SpO2: 95% 99% 96% 93%    Intake/Output Summary (Last 24 hours) at 06/05/2019 0756 Last data filed at 06/05/2019 0630 Gross per 24 hour  Intake 480 ml  Output 250 ml  Net 230 ml   There were no vitals filed for this visit.  Physical Examination:  General exam: Appears calm and comfortable  HEENT: Mild left sided scalp tenderness, no temporal tenderness per se. EOMI, no nystagmus. Molar tooth appears infected with some submandibular swelling/tenderness extending to mastoid. Respiratory system: Clear to auscultation. Respiratory effort normal. Cardiovascular system: S1 & S2 heard, RRR. No JVD, murmurs, rubs, gallops or clicks. No pedal edema. Gastrointestinal system: Abdomen is nondistended, soft and nontender. Normal bowel sounds heard. Central nervous system: Alert and oriented. No new focal neurological deficits. Extremities: No contractures, edema or joint deformities.  Skin: No rashes, lesions or ulcers Psychiatry: Judgement and insight appear normal. Mood & affect appropriate.   Data Reviewed: I have personally reviewed following labs and imaging studies  CBC: Recent Labs  Lab 06/04/19 0007 06/05/19 0532  WBC 6.8 6.8  NEUTROABS 3.5  --   HGB 16.2 17.4*  HCT 50.0 54.0*  MCV 85.9 86.8  PLT 176 254   Basic Metabolic Panel: Recent Labs  Lab 06/04/19 0007 06/05/19 0531  NA 138 135  K 4.0 4.6  CL 102  100  CO2 28 25  GLUCOSE 108* 196*  BUN 13 14  CREATININE 1.06 1.15  CALCIUM 9.0 8.9   GFR: CrCl cannot be calculated (Unknown ideal weight.). Liver Function Tests: Recent Labs  Lab 06/04/19 1241  AST 23  ALT 15  ALKPHOS 70  BILITOT 1.3*  PROT 6.5  ALBUMIN 3.3*   No results for input(s): LIPASE, AMYLASE in the last 168 hours. Recent Labs    Lab 06/04/19 1215  AMMONIA 55*   Coagulation Profile: Recent Labs  Lab 06/04/19 1241  INR 1.1   Cardiac Enzymes: No results for input(s): CKTOTAL, CKMB, CKMBINDEX, TROPONINI in the last 168 hours. BNP (last 3 results) No results for input(s): PROBNP in the last 8760 hours. HbA1C: No results for input(s): HGBA1C in the last 72 hours. CBG: No results for input(s): GLUCAP in the last 168 hours. Lipid Profile: Recent Labs    06/05/19 0531  CHOL 204*  HDL 45  LDLCALC 151*  TRIG 42  CHOLHDL 4.5   Thyroid Function Tests: Recent Labs    06/04/19 1217  TSH 0.995   Anemia Panel: No results for input(s): VITAMINB12, FOLATE, FERRITIN, TIBC, IRON, RETICCTPCT in the last 72 hours. Sepsis Labs: Recent Labs  Lab 06/04/19 1214  LATICACIDVEN 1.0    Recent Results (from the past 240 hour(s))  SARS Coronavirus 2 by RT PCR (hospital order, performed in [facility] hospital lab) Nasopharyngeal Nasopharyngeal Swab     Status: None   Collection Time: 06/04/19  7:24 PM   Specimen: Nasopharyngeal Swab  Result Value Ref Range Status   SARS Coronavirus 2 NEGATIVE NEGATIVE Final    Comment: (NOTE) SARS-CoV-2 target nucleic acids are NOT DETECTED. The SARS-CoV-2 RNA is generally detectable in upper and lower respiratory specimens during the acute phase of infection. The lowest concentration of SARS-CoV-2 viral copies this assay can detect is 250 copies / mL. A negative result does not preclude SARS-CoV-2 infection and should not be used as the sole basis for treatment or other patient management decisions.  A negative result may occur with improper specimen collection / handling, submission of specimen other than nasopharyngeal swab, presence of viral mutation(s) within the areas targeted by this assay, and inadequate number of viral copies (<250 copies / mL). A negative result must be combined with clinical observations, patient history, and epidemiological information. Fact Sheet  for Patients:   StrictlyIdeas.no Fact Sheet for Healthcare Providers: BankingDealers.co.za This test is not yet approved or cleared  by the [place] FDA and has been authorized for detection and/or diagnosis of SARS-CoV-2 by FDA under an Emergency Use Authorization (EUA).  This EUA will remain in effect (meaning this test can be used) for the duration of the COVID-19 declaration under Section 564(b)(1) of the Act, 21 U.S.C. section 360bbb-3(b)(1), unless the authorization is terminated or revoked sooner. Performed at [facility], [place] [address], [place],  [address]       Radiology Studies: CT Angio Head W or Wo Contrast  Result Date: 06/04/2019 CLINICAL DATA:  Headache with pain behind the right eye EXAM: CT ANGIOGRAPHY HEAD TECHNIQUE: Multidetector CT imaging of the head was performed using the standard protocol during bolus administration of intravenous contrast. Multiplanar CT image reconstructions and MIPs were obtained to evaluate the vascular anatomy. CONTRAST:  157m OMNIPAQUE IOHEXOL 350 MG/ML SOLN COMPARISON:  08/23/2016 head CT FINDINGS: CT HEAD Brain: There is an old left caudate small vessel infarct. No acute hemorrhage or extra-axial collection. Brain parenchyma otherwise normal.  Vascular: No abnormal hyperdensity of the major intracranial arteries or dural venous sinuses. No intracranial atherosclerosis. Skull: The visualized skull base, calvarium and extracranial soft tissues are normal. Sinuses/Orbits: No fluid levels or advanced mucosal thickening of the visualized paranasal sinuses. No mastoid or middle ear effusion. The orbits are normal. CTA HEAD POSTERIOR CIRCULATION: --Vertebral arteries: Normal V4 segments. --Inferior cerebellar arteries: Normal. --Basilar artery: Normal. --Superior cerebellar arteries: Normal. --Posterior cerebral arteries: Normal. ANTERIOR CIRCULATION: --Intracranial internal  carotid arteries: Normal. --Anterior cerebral arteries (ACA): Normal. Both A1 segments are present. Patent anterior communicating artery (a-comm). --Middle cerebral arteries (MCA): Normal. Venous sinuses: As permitted by contrast timing, patent. Anatomic variants: None IMPRESSION: 1. No intracranial arterial occlusion or high-grade stenosis. 2. No aneurysm or subarachnoid hemorrhage. 3. 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 Electronically Signed   By: Ulyses Jarred M.D.   On: 06/04/2019 01:56   MR BRAIN WO CONTRAST  Result Date: 06/04/2019 CLINICAL DATA:  54 year old male with unexplained altered mental status. Headache with pain behind the right eye. EXAM: MRI HEAD WITHOUT CONTRAST TECHNIQUE: Multiplanar, multiecho pulse sequences of the brain and surrounding structures were obtained without intravenous contrast. COMPARISON:  CT and CTA head earlier today.  Brain MRI 03/19/2019. FINDINGS: Brain: No restricted diffusion or evidence of acute infarction. Chronic ischemia with encephalomalacia in the left middle frontal gyrus, corona radiata, left lentiform. However, contralateral T2 and FLAIR hyperintense linear lesion of the anterior right frontal lobe white matter is new from 03/19/2019 but most resembles a chronic lacunar infarct (series 8, image 17, series 11, image 35, with facilitated diffusion). Chronic hemosiderin associated with the former, but trace if any microhemorrhage in the new or right frontal lobe lesion. Elsewhere stable gray and white matter signal since March including patchy posterior hemisphere and central pontine T2 and FLAIR hyperintensity. No other cortical encephalomalacia or chronic cerebral blood products identified. Right basal ganglia and thalami remain within normal limits. Questionable tiny chronic infarct in the posterior right cerebellum on series 8, image 7. No midline shift, mass effect, evidence of mass lesion, ventriculomegaly, extra-axial collection or acute intracranial  hemorrhage. Cervicomedullary junction and pituitary are within normal limits. Vascular: Major intracranial vascular flow voids are stable since March. Mild generalized intracranial artery tortuosity. Skull and upper cervical spine: Stable and negative. Sinuses/Orbits: Stable and negative. Other: Stable hypertrophied adenoid soft tissue with no suspicious features. Mastoids and tympanic cavities remain clear. Negative scalp and face soft tissues. IMPRESSION: 1. 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 2. No superimposed acute infarct identified. And otherwise stable chronic ischemia, with a combination of bilateral small vessel disease and medium size vessel involvement in the left MCA territory. Electronically Signed   By: Genevie Ann M.D.   On: 06/04/2019 18:22        Scheduled Meds: . aspirin  325 mg Oral Daily  . atorvastatin  20 mg Oral Daily  . enoxaparin (LOVENOX) injection  40 mg Subcutaneous QHS  . furosemide  20 mg Oral Daily  . hydrALAZINE  20 mg Intravenous Once  . ketorolac  30 mg Intravenous Q6H   And  . metoCLOPramide (REGLAN) injection  10 mg Intravenous Q6H  . lisinopril  20 mg Oral Daily   Continuous Infusions: Assessment/Plan:  1. Headache/dizziness with transient vision loss: posterior circulation TIA vs temporal arteritis vs complex migraine vs occipital neuritis vs Sinusitis/dental abscess. MRI head revealed chronic but new  anterior right frontal lobe white matter lacunar infarct with chronic  hemosiderin deposition (new since March 1st MRI which was done for ataxia evaluation and revealed chronic left MCA territory infarcts involving the left basal ganglia and overlying left frontal lobe).No superimposed acute infarct identified. An otherwise stable chronic ischemia, with a combination of bilateral small vessel disease and medium size vessel involvement in the left MCA territory. CTA head showed no intracranial arterial occlusion  or high-grade stenosis.No aneurysm or subarachnoid hemorrhage-Old left caudate small vessel infarct. Was advised to take 81 mg asa when evaluated in ED for ataxia in 03/2019 but has not been using, admitted now with 312m. Also on Lipitor 221mat home . Check orthostatics, lipid panel, MRA neck for ant/ posterior circulation eval, echo. Check ESR -if elevated would consider temporal artery biopsy. PT/OT evaluation. May need work note if still ataxic on d/c as works with heavy weights. Patient received 1 dose of decadron in Ed last night and admitted with scheduled IV Toradol/reglan for possible migraines--reports some improvement in headaches  2. HTN: On lasix and lisinopril. Also noted to be on sildanefil TIW. Check orthostatics.  3. H/O NICM , chronic systolic CHF: No significant decompensation. It appears that he had low EF 30-35% in 2016-2018 which improved to 50% in 2020. Cath nl in 10/2014. On lisinopril/lasix at baseline. Recheck echo  4. Toothache/dental infection: needs dental f/u. ([place]oved here from [place] months back and has not seen a dentist since) Will give oral augmentin. Will obtain Ct sinus/face in am as received contrasted studies yesterday and today. Dental consult in am   Addendum 5 :30 pm: MRA -ve for vertebral dissection/thrombosis, echo stage 1 diastolic dysfunction, EF 5025%ike before, CRP <1, ESR results still pending. Repeat another dose of steroids and continue scheduled toradol/reglan for possible complex migraines. Still has headache-d/w neurology Dr ArRory Percywho felt reasonable to treat as complex migraines with current IV regimen and to given additional dose of solumedrol 50032mV x 1. Dr AroRory Percyso recommended vasculitis w/u (ANA, ANCA etc) as well as B12/ hgbA1C check --ordered.     LOS: 0 days   DVT prophylaxis: lovenox Code Status: full code Family / Patient Communication: d/w patient and friend at bedside Disposition Plan:   Status is: Observation  Will  continue Observation status for now as d/w [facility] nurse, may go home in am if headache resolves  Dispo: The patient is from: Home              Anticipated d/c is to: Home              Anticipated d/c date is: 1 day              Patient currently is not medically stable to d/c.    Time spent: 35 minutes   [place] ShiD [facility] Pager in [place] 7PM-7AM, please contact night-coverage [email or web address] 06/05/2019, 7:56 AM

## 2019-06-06 ENCOUNTER — Encounter: Payer: Self-pay | Admitting: Neurology

## 2019-06-06 DIAGNOSIS — R519 Headache, unspecified: Secondary | ICD-10-CM

## 2019-06-06 DIAGNOSIS — M273 Alveolitis of jaws: Secondary | ICD-10-CM

## 2019-06-06 DIAGNOSIS — R278 Other lack of coordination: Secondary | ICD-10-CM

## 2019-06-06 DIAGNOSIS — I639 Cerebral infarction, unspecified: Secondary | ICD-10-CM | POA: Diagnosis not present

## 2019-06-06 DIAGNOSIS — F1210 Cannabis abuse, uncomplicated: Secondary | ICD-10-CM

## 2019-06-06 DIAGNOSIS — E538 Deficiency of other specified B group vitamins: Secondary | ICD-10-CM

## 2019-06-06 DIAGNOSIS — I5022 Chronic systolic (congestive) heart failure: Secondary | ICD-10-CM | POA: Diagnosis not present

## 2019-06-06 LAB — GLUCOSE, CAPILLARY
Glucose-Capillary: 180 mg/dL — ABNORMAL HIGH (ref 70–99)
Glucose-Capillary: 126 mg/dL — ABNORMAL HIGH (ref 70–99)

## 2019-06-06 LAB — HEMOGLOBIN A1C
Hgb A1c MFr Bld: 6.1 % — ABNORMAL HIGH (ref 4.8–5.6)
Mean Plasma Glucose: 128 mg/dL

## 2019-06-06 LAB — ANCA TITERS
Atypical P-ANCA titer: <1:20 {titer}
C-ANCA: <1:20 {titer}
P-ANCA: <1:20 {titer}

## 2019-06-06 LAB — SJOGRENS SYNDROME-A EXTRACTABLE NUCLEAR ANTIBODY: SSA (Ro) (ENA) Antibody, IgG: <0.2 AI (ref 0.0–0.9)

## 2019-06-06 LAB — MPO/PR-3 (ANCA) ANTIBODIES
ANCA Proteinase 3: <3.5 U/mL (ref 0.0–3.5)
Myeloperoxidase Abs: <9 U/mL (ref 0.0–9.0)

## 2019-06-06 LAB — SJOGRENS SYNDROME-B EXTRACTABLE NUCLEAR ANTIBODY: SSB (La) (ENA) Antibody, IgG: <0.2 AI (ref 0.0–0.9)

## 2019-06-06 MED ORDER — POLYETHYLENE GLYCOL 3350 17 G PO PACK: 17.0000 g | PACK | Freq: Every day | ORAL | 0 refills | Status: DC | PRN

## 2019-06-06 MED ORDER — CYANOCOBALAMIN 1000 MCG/ML IJ SOLN
1000.0000 ug | Freq: Once | INTRAMUSCULAR | Status: AC
  Administered 2019-06-06: 1000 ug via INTRAMUSCULAR
  Filled 2019-06-06: qty 1

## 2019-06-06 MED ORDER — CYANOCOBALAMIN 2000 MCG PO TABS: 2000.0000 ug | ORAL_TABLET | Freq: Every day | ORAL | 1 refills | Status: DC

## 2019-06-06 MED ORDER — AMOXICILLIN-POT CLAVULANATE 875-125 MG PO TABS: 1.0000 | ORAL_TABLET | Freq: Two times a day (BID) | ORAL | 0 refills | Status: AC

## 2019-06-06 MED ORDER — VITAMIN B-12 1000 MCG PO TABS
2000.0000 ug | ORAL_TABLET | Freq: Every day | ORAL | Status: DC
  Administered 2019-06-06: 2000 ug via ORAL
  Filled 2019-06-06: qty 2

## 2019-06-06 MED ORDER — ATORVASTATIN CALCIUM 40 MG PO TABS: 40.0000 mg | ORAL_TABLET | Freq: Every day | ORAL | 1 refills | Status: DC

## 2019-06-06 MED ORDER — BUTALBITAL-APAP-CAFFEINE 50-325-40 MG PO TABS: 1.0000 | ORAL_TABLET | Freq: Two times a day (BID) | ORAL | 0 refills | Status: DC | PRN

## 2019-06-06 NOTE — Progress Notes (Signed)
Discussed with patient discharge instructions, he verbalized agreement and understanding.  Patient to leave in private vehicle with all belongings.   

## 2019-06-06 NOTE — TOC Initial Note (Signed)
Transition of Care [facility]) - Initial/Assessment Note    Patient Details  Name: Kenneth Gould MRN: [id number] Date of Birth: Sep 24, 1965  [facility]) CM/SW Contact:    Ida Rogue, LCSW Phone Number: 06/06/2019, 12:03 PM  Clinical Narrative:  Referral made to OPPT and PCP per MD request. TOC sign off.                 Expected Discharge Plan: Home/Self Care Barriers to Discharge: No Barriers Identified   Patient Goals and CMS Choice        Expected Discharge Plan and Services Expected Discharge Plan: Home/Self Care                                              Prior Living Arrangements/Services                       Activities of Daily Living Home Assistive Devices/Equipment: None ADL Screening (condition at time of admission) Patient's cognitive ability adequate to safely complete daily activities?: Yes Is the patient deaf or have difficulty hearing?: No Does the patient have difficulty seeing, even when wearing glasses/contacts?: No Does the patient have difficulty concentrating, remembering, or making decisions?: No Patient able to express need for assistance with ADLs?: Yes Does the patient have difficulty dressing or bathing?: No Independently performs ADLs?: Yes (appropriate for developmental age) Does the patient have difficulty walking or climbing stairs?: No Weakness of Legs: None Weakness of Arms/Hands: None  Permission Sought/Granted                  Emotional Assessment              Admission diagnosis:  Bad headache [R51.9] At risk for inadequate pain control [Z91.89] Patient Active Problem List   Diagnosis Date Noted  . Headache 06/04/2019  . CVA (cerebral vascular accident) (HCC) 06/04/2019  . At risk for inadequate pain control 06/04/2019  . Congestive heart failure (CHF) (HCC) 10/12/2018  . Atypical chest pain 08/24/2016  . Chronic systolic CHF (congestive heart failure) (HCC)   . Chest pain   .  Unstable angina pectoris (HCC) 10/25/2014  . NICM (nonischemic cardiomyopathy) (HCC) 10/25/2014  . Chest pain with moderate risk for cardiac etiology 10/24/2014  . Essential hypertension 10/24/2014  . Chest pain, moderate coronary artery risk 10/24/2014   PCP:  System, Pcp Not In Pharmacy:   [id number] [place], [place] - [address] AT [facility] [address] [place] [address] Phone: [phone] Fax: [phone]     Social Determinants of Health (SDOH) Interventions    Readmission Risk Interventions No flowsheet data found.

## 2019-06-06 NOTE — Evaluation (Signed)
Physical Therapy Evaluation Patient Details Name: Kenneth Gould MRN: [id number] DOB: 1965-08-06 Today's Date: 06/06/2019   History of Present Illness  54 year old male with a history of well-controlled HTN, hyperlipidemia, NICM /chronic systolic CHF presents with complaint of a severe persistent headache x 1 day - feels as though someone is pulling his right upper socket.  He reports his hands and feet have been numb for couple weeks.  He also reports dizziness- worse on standing, been in bed most of the week because he is so frequently off balance/presyncopal.  He has never passed out.He reports some sinus and right ear congestion but denies any fevers or chills.He denies tinnitus, postnasal drip or hearing loss.During hospitalist interview, he complained of significant  Toothache-  back tooth on the left side x 2 days.  He states the pain radiates from the head around the tooth and left side of his face feels congested swollen.    Clinical Impression  Pt admitted with above diagnosis. 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 Patient agreeable to PT evaluation today. Patient performs bed mobility independently. He requires min guard assistance for transfers, ambulation and stairs due to complaints of dizziness and a somewhat varying path with infrequent reaching for hall railings to steady himself. When going up and down stairs, patient able to perform with handrail on right but required standing rest breaks at the top and bottom due to increased complaints of dizziness. Patient reports he has a pull out couch in the main level in his home and would be able to use that for a few days upon returning home so he wouldn't have to climb the 13 stairs to his bedroom. Patient would benefit from ambulation with nursing monitoring symptoms to increase his activity level safely. Pt currently with functional limitations due to the deficits listed below (see PT Problem List). Pt  will benefit from skilled PT to increase their independence and safety with mobility to allow discharge to the venue listed below.       Follow Up Recommendations No PT follow up    Equipment Recommendations  Cane    Recommendations for Other Services       Precautions / Restrictions Precautions Precautions: Fall Restrictions Weight Bearing Restrictions: No      Mobility  Bed Mobility Overal bed mobility: Independent                Transfers Overall transfer level: Needs assistance Equipment used: None Transfers: Sit to/from Stand;Stand Pivot Transfers Sit to Stand: Min guard Stand pivot transfers: Min guard       General transfer comment: min guard for safety due to complaints of dizziness.  Ambulation/Gait Ambulation/Gait assistance: Min guard Gait Distance (Feet): 250 Feet Assistive device: None Gait Pattern/deviations: Step-through pattern;Decreased step length - right;Decreased step length - left;Decreased stance time - left;Drifts right/left Gait velocity: decreased   General Gait Details: somewhat slow, somewhat labored gait, mild unsteadiness, reaching for railing in hallway x1  Stairs Stairs: Yes Stairs assistance: Min guard Stair Management: One rail Right Number of Stairs: 12 General stair comments: increased dizziness at top and bottom of stairs requiring a standing rest break at each before continuing mobility.  Wheelchair Mobility    Modified Rankin (Stroke Patients Only)       Balance Overall balance assessment: Needs assistance Sitting-balance support: No upper extremity supported;Feet supported Sitting balance-Leahy Scale: Good     Standing balance support: No upper extremity supported;During functional activity  Standing balance-Leahy Scale: Fair Standing balance comment: fair without assistive device                             Pertinent Vitals/Pain Pain Assessment: No/denies pain    Home Living  Family/patient expects to be discharged to:: Private residence Living Arrangements: Alone Available Help at Discharge: Friend(s);Available PRN/intermittently Type of Home: House Home Access: Stairs to enter Entrance Stairs-Rails: Left Entrance Stairs-Number of Steps: 4 Home Layout: Two level;Able to live on main level with bedroom/bathroom Home Equipment: None      Prior Function Level of Independence: Independent               Hand Dominance   Dominant Hand: Right    Extremity/Trunk Assessment   Upper Extremity Assessment Upper Extremity Assessment: Defer to OT evaluation    Lower Extremity Assessment Lower Extremity Assessment: Overall WFL for tasks assessed    Cervical / Trunk Assessment Cervical / Trunk Assessment: Normal  Communication   Communication: No difficulties  Cognition Arousal/Alertness: Awake/alert Behavior During Therapy: WFL for tasks assessed/performed Overall Cognitive Status: Within Functional Limits for tasks assessed                                        General Comments      Exercises     Assessment/Plan    PT Assessment Patient needs continued PT services  PT Problem List Decreased mobility;Decreased activity tolerance;Decreased balance;Decreased knowledge of use of DME       PT Treatment Interventions DME instruction;Therapeutic activities;Gait training;Therapeutic exercise;Patient/family education;Stair training;Balance training    PT Goals (Current goals can be found in the Care Plan section)  Acute Rehab PT Goals Patient Stated Goal: Stop being dizzy and return to normal life. PT Goal Formulation: With patient Time For Goal Achievement: 06/20/19 Potential to Achieve Goals: Good    Frequency Min 3X/week   Barriers to discharge        Co-evaluation               AM-PAC PT "6 Clicks" Mobility  Outcome Measure Help needed turning from your back to your side while in a flat bed without using  bedrails?: None Help needed moving from lying on your back to sitting on the side of a flat bed without using bedrails?: None Help needed moving to and from a bed to a chair (including a wheelchair)?: A Little Help needed standing up from a chair using your arms (e.g., wheelchair or bedside chair)?: A Little Help needed to walk in hospital room?: A Little Help needed climbing 3-5 steps with a railing? : A Little 6 Click Score: 20    End of Session Equipment Utilized During Treatment: Gait belt Activity Tolerance: Patient tolerated treatment well;Patient limited by fatigue Patient left: in chair;with call bell/phone within reach Nurse Communication: Mobility status PT Visit Diagnosis: Unsteadiness on feet (R26.81);Other abnormalities of gait and mobility (R26.89);Dizziness and giddiness (R42)    Time: 4696-2952 PT Time Calculation (min) (ACUTE ONLY): 30 min   Charges:   PT Evaluation $PT Eval Low Complexity: 1 Low PT Treatments $Gait Training: 8-22 mins        Katina Dung. Hartnett-Rands, MS, PT [place] [facility] [phone] 06/06/2019, 9:23 AM

## 2019-06-06 NOTE — Discharge Summary (Addendum)
Physician Discharge Summary  Arnel Wymer ION:[id number] DOB: 1965-06-16 DOA: 06/03/2019  PCP: System, Pcp Not In  Admit date: 06/04/2019 Discharge date: 06/06/2019 Consultations: Neuology Dr Rory Percy (phone consult) Admitted From: home Disposition: home  Discharge Diagnoses:  Principal Problem:   Sensory ataxia Active Problems:   Headache   Vitamin B12 deficiency   Infection of tooth socket   Essential hypertension   NICM (nonischemic cardiomyopathy) ([place])   Chronic systolic CHF (congestive heart failure) (HCC)   CVA (cerebral vascular accident) [facility])   Marijuana abuse   Hospital Course Summary: 54 year old male with a history of well-controlled HTN, hyperlipidemia, NICM /chronic systolic CHF presented to [facility] ED with complaint of a severe persistent headache x 3 days - 10/10 , mostly frontal and left perietal/temporal throbbing in nature associated with facial pain/jaw pain. He also reports pain behind his left eye the day prior to admission, one episode of transient vision impairment and had to close his eye (vague if unilateral or bilateral). He states he has had chronic back issues and reports chronic symmetric numbness in hands and feet for few weeks to months. He also reported worsening ataxia   (some times calls it being dizzy but describes being wobbly than spinning sensation)- worse on standing, been in bed most of the week because he is so frequently off balance/presyncopal. He has never passed out. ROS on interview also positive for c/o sinus and right ear congestion but denies any fevers or chills.He denies tinnitus, postnasal drip or hearing loss.During hospitalist interview, he complained of significant toothache-  back tooth on the left side x 2 days. He states the pain radiates from the head around the tooth and left side of his face feels congested swollen.  ED Course: Afebrile, patient had an extensive work-up including CTAhead and neck, MRI brain,covidtesting. MRI  significant for a chronic lacunar frontal lobe infarct  that appears new since MRI in march but chronic. His UDS was positive for marijuana. He received Benadryl, Dilaudid, Toradol, Compazine,none of which seem to have improved his headache except for the 0.5 mg IV Dilaudid.  Hospital course: Patient admitted to [facility] for further evaluation and management. Since been on the medical floor , patient's main compliant was severe ataxia ("dizzy") , unable to stand without loosing balance and headache.   1. Headache/dizziness with transient vision loss: Differentials considered during hospitalization-posterior circulation TIA vs temporal arteritis vs complex migraine vs occipital neuritis vs Sinusitis/dental abscess vs posterior column deficits. MRI head revealed chronic but new  anterior right frontal lobe white matter lacunar infarct with chronic hemosiderin deposition (new since March 1st MRI which was done for ataxia evaluation and revealed chronic left MCA territory infarcts involving the left basal ganglia and overlying left frontal lobe).No superimposed acute infarct identified. An otherwise stable chronic ischemia, with a combination of bilateral small vessel disease and medium size vessel involvement in the left MCA territory was reported. CTA head showed no intracranial arterial occlusion or high-grade stenosis.No aneurysm or subarachnoid hemorrhage-Old left caudate small vessel infarct. Was advised to take 81 mg asa when evaluated in ED for ataxia in 03/2019 but has not been using, admitted now with 319m. Also on Lipitor 257mat home . BP on orthostatic check was stable without postural drop, lipid panel showed LDL 151 (lipitor increased to 4090m, MRA neck was unremarkable for  ant/ posterior circulation issues, echo showed stage 1 diastolic dysfunction, EF 50%24%ke before, CRP <1, ESR was 0 making temporal arteritis highly unlikely. Case Discussed in  detail with neurology Dr Rory Percy who reviewed patient's  imaging studies as well as prior LP results from 2017 and recommended to treat as complex migraine while ruling out vasculitis/b12 /folate deficiency. Lab work for ANA/ p ANCA/ C ANCA/ Sjogrens/ copper level/ b12/folate/ HgbA1C sent .   Patient treated with Reglan/solumedrol/benadryl x 48 hrs with which his headache has improved. He feels Fioricet is helping as well. Will be discharged on the sane for prn use for headache and will treat tooth infection with Augmentin (should help with sinusitis as well) . His B12 level resulted low at 97 and at this point felt to be causing sensory ataxia as well as symmetric peripheral numbness/tingling in hands and feet. His visual symptoms could have been related to migraine  Vs optic neuroapthy from b12 deficiency. Dr Rory Percy recommended treating patient with monthly B12 IM injections along with oral supplements for few months and then oral supplements only. Patient received B12 injection x 1 on the day of discharge. He recently moved from [place] and does not have PCP in the area. He has been given referral for wellness clinic and advised to follow up for further monthly injections. Neurology referral placed for OP f/u as suggested by Dr Rory Percy for f/u on progress and possibly hypergoaguable w/u. He was evaluated by  PT/OT and a cane issued as he did require standing rest breaks and guard assistance. Also given referral for outpatient PT if ataxia persists. 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  2. HTN: On lasix and lisinopril. Also noted to be on sildanefil TIW. Orthostatics unremarkable at home. Resume home meds, would avoid sildenafil until symptoms of dizziness resolve.  3. H/O NICM , chronic systolic CHF: No significant decompensation. It appears that he had low EF 30-35% in 2016-2018 which improved to 50% in 2020. Cath nl in 10/2014. On lisinopril/lasix at baseline.  Recheck echo with similar findings  4. Toothache/dental infection: Improved with Augmentin with less pain and resolution of mild submandibular swelling that was noted yesterday-needs dental clinic f/u which he plans to arrange for himself upon discharge (moved here from [place] 6 months back and has not seen a dentist since) Will give oral Augmentin x 7 days    5. Marijuana abuse: counseled to quit and advised possibly contributing to #1    Discharge Exam: Vitals:   06/05/19 2052 06/06/19 0557  BP: (!) 156/107 (!) 151/95  Pulse: 64 60  Resp: 16 18  Temp: 98.2 F (36.8 C) 97.8 F (36.6 C)  SpO2: 95% 96%   Vitals:   06/05/19 1332 06/05/19 2052 06/06/19 0557 06/06/19 0857  BP: (!) 144/89 (!) 156/107 (!) 151/95   Pulse: 70 64 60   Resp: '16 16 18   ' Temp: 98.1 F (36.7 C) 98.2 F (36.8 C) 97.8 F (36.6 C)   TempSrc: Oral     SpO2: 100% 95% 96%   Weight:    (!) 140.6 kg  Height:    '5\' 9"'  (1.753 m)    General: Pt is alert, awake, not in acute distress Cardiovascular: RRR, S1/S2 +, no rubs, no gallops Respiratory: CTA bilaterally, no wheezing, no rhonchi Abdominal: Soft, NT, ND, bowel sounds + Extremities: no edema, no cyanosis  Discharge Condition:Stable CODE STATUS: Full code Diet recommendation: low salt, low fat  diet Recommendations for Outpatient Follow-up:  1. Follow up with PCP: 2 weeks in wellness clinic or  new PCP if patient able to arrange sooner 2. Follow up with consultants: [facility] neurology clinic (referral placed) in 2-3 weeks and Dental clinic in 5-7 days 3. Please obtain follow up labs including: vasculitis w/u, copper level  Home Health services upon discharge: OP PT referral Equipment/Devices upon discharge: cane   Discharge Instructions:  Discharge Instructions    Ambulatory referral to Neurology   Complete by: As directed    An appointment is requested in approximately: 2 -3 weeks   Call MD for:   Complete by: As directed    Worsening  tooth ache   Call MD for:  difficulty breathing, headache or visual disturbances   Complete by: As directed    Call MD for:  extreme fatigue   Complete by: As directed    Call MD for:  persistant dizziness or light-headedness   Complete by: As directed    Call MD for:  persistant nausea and vomiting   Complete by: As directed    Call MD for:  redness, tenderness, or signs of infection (pain, swelling, redness, odor or green/yellow discharge around incision site)   Complete by: As directed    Call MD for:  temperature >100.4   Complete by: As directed    Diet - low sodium heart healthy   Complete by: As directed    Increase activity slowly   Complete by: As directed    Other Restrictions   Complete by: As directed    Ambulate with cane as advised by PT. Work restriction for 2 weeks, until cleared by PCP /physical therapy     Allergies as of 06/06/2019      Reactions   Nyquil Multi-symptom [pseudoeph-doxylamine-dm-apap] Nausea And Vomiting      Medication List    STOP taking these medications   lidocaine 5 % Commonly known as: LIDODERM   potassium chloride 10 MEQ tablet Commonly known as: KLOR-CON   sildenafil 50 MG tablet Commonly known as: VIAGRA   traMADol 50 MG tablet Commonly known as: ULTRAM     TAKE these medications   albuterol 108 (90 Base) MCG/ACT inhaler Commonly known as: VENTOLIN HFA Inhale 2 puffs into the lungs every 6 (six) hours as needed for wheezing or shortness of breath.   amoxicillin-clavulanate 875-125 MG tablet Commonly known as: AUGMENTIN Take 1 tablet by mouth every 12 (twelve) hours for 7 days.   aspirin 81 MG chewable tablet Chew 1 tablet (81 mg total) by mouth daily.   atorvastatin 40 MG tablet Commonly known as: LIPITOR Take 1 tablet (40 mg total) by mouth daily. What changed:   medication strength  how much to take   butalbital-acetaminophen-caffeine 50-325-40 MG tablet Commonly known as: FIORICET Take 1 tablet by mouth  every 12 (twelve) hours as needed for headache or migraine.   cyanocobalamin 2000 MCG tablet Take 1 tablet (2,000 mcg total) by mouth daily.   furosemide 20 MG tablet Commonly known as: LASIX Take 1 tablet (20 mg total) by mouth daily.   lisinopril 20 MG tablet Commonly known as: ZESTRIL Take 1 tablet (20 mg total) by mouth daily.   polyethylene glycol 17 g packet Commonly known as: MIRALAX / GLYCOLAX Take 17 g by mouth daily as needed for mild constipation.      Follow-up Information    [facility].   Contact information: [address]     [address] [place] [address] [place].   Contact information: [address], [place] Follow up on 06/29/2019.   Specialty: Internal Medicine Why: Friday at 9:20 for your hospital follow up appointment.  I asked them to put you on a cancellation list in case they can get you in sooner.  Call them to cancel if you are able to find anther provider sooner Contact information: [place] [id number] [place] [address] [phone]         Allergies  Allergen Reactions  . Nyquil Multi-Symptom [Pseudoeph-Doxylamine-Dm-Apap] Nausea And Vomiting      The results of significant diagnostics from this hospitalization (including imaging, microbiology, ancillary and laboratory) are listed below for reference.    Labs: BNP (last 3 results) Recent Labs    10/04/18 0353 10/12/18 1828 11/07/18 0133  BNP 68.5 31.5 41.6   Basic Metabolic Panel: Recent Labs  Lab 06/04/19 0007 06/05/19 0531  NA 138 135  K 4.0 4.6  CL 102 100  CO2 28 25  GLUCOSE 108* 196*  BUN 13 14  CREATININE 1.06 1.15  CALCIUM 9.0 8.9   Liver Function Tests: Recent Labs  Lab 06/04/19 1241  AST 23  ALT 15  ALKPHOS 70  BILITOT 1.3*  PROT 6.5  ALBUMIN 3.3*   No  results for input(s): LIPASE, AMYLASE in the last 168 hours. Recent Labs  Lab 06/04/19 1215  AMMONIA 55*   CBC: Recent Labs  Lab 06/04/19 0007 06/05/19 0532  WBC 6.8 6.8  NEUTROABS 3.5  --   HGB 16.2 17.4*  HCT 50.0 54.0*  MCV 85.9 86.8  PLT 176 171   Cardiac Enzymes: No results for input(s): CKTOTAL, CKMB, CKMBINDEX, TROPONINI in the last 168 hours. BNP: Invalid input(s): POCBNP CBG: Recent Labs  Lab 06/05/19 2054 06/06/19 0806 06/06/19 1137  GLUCAP 119* 180* 126*   D-Dimer No results for input(s): DDIMER in the last 72 hours. Hgb A1c Recent Labs    06/05/19 1810  HGBA1C 6.1*   Lipid Profile Recent Labs    06/05/19 0531  CHOL 204*  HDL 45  LDLCALC 151*  TRIG 42  CHOLHDL 4.5   Thyroid function studies Recent Labs    06/04/19 1217  TSH 0.995   Anemia work up Recent Labs    06/05/19 1810  VITAMINB12 97*  FOLATE 8.5   Urinalysis    Component Value Date/Time   COLORURINE YELLOW 05/26/2019 [place] 05/26/2019 1446   LABSPEC 1.018 05/26/2019 1446   [place] 6.0 05/26/2019 1446   GLUCOSEU NEGATIVE 05/26/2019 1446   HGBUR NEGATIVE 05/26/2019 [place] NEGATIVE 05/26/2019 [place] 05/26/2019 1446   PROTEINUR NEGATIVE 05/26/2019 1446   NITRITE NEGATIVE 05/26/2019 1446   LEUKOCYTESUR NEGATIVE 05/26/2019 1446   Sepsis Labs Invalid input(s): PROCALCITONIN,  WBC,  LACTICIDVEN Microbiology Recent Results (from the past 240 hour(s))  SARS Coronavirus 2 by RT PCR (hospital order, performed in [place] hospital lab) Nasopharyngeal Nasopharyngeal Swab     Status: None   Collection Time: 06/04/19  7:24 PM   Specimen: Nasopharyngeal Swab  Result Value Ref Range Status   SARS Coronavirus 2 NEGATIVE NEGATIVE Final    Comment: (NOTE) SARS-CoV-2 target nucleic acids are NOT DETECTED. The SARS-CoV-2 RNA is generally detectable in upper and lower respiratory specimens during the acute phase of infection. The  lowest concentration of SARS-CoV-2 viral copies this assay can detect is 250 copies /  mL. A negative result does not preclude SARS-CoV-2 infection and should not be used as the sole basis for treatment or other patient management decisions.  A negative result may occur with improper specimen collection / handling, submission of specimen other than nasopharyngeal swab, presence of viral mutation(s) within the areas targeted by this assay, and inadequate number of viral copies (<250 copies / mL). A negative result must be combined with clinical observations, patient history, and epidemiological information. Fact Sheet for Patients:   StrictlyIdeas.no Fact Sheet for Healthcare Providers: BankingDealers.co.za This test is not yet approved or cleared  by the [place] FDA and has been authorized for detection and/or diagnosis of SARS-CoV-2 by FDA under an Emergency Use Authorization (EUA).  This EUA will remain in effect (meaning this test can be used) for the duration of the COVID-19 declaration under Section 564(b)(1) of the Act, 21 U.S.C. section 360bbb-3(b)(1), unless the authorization is terminated or revoked sooner. Performed at [facility], [place] [address] [place], [place] [address]     Procedures/Studies: CT Angio Head W or Wo Contrast  Result Date: 06/04/2019 CLINICAL DATA:  Headache with pain behind the right eye EXAM: CT ANGIOGRAPHY HEAD TECHNIQUE: Multidetector CT imaging of the head was performed using the standard protocol during bolus administration of intravenous contrast. Multiplanar CT image reconstructions and MIPs were obtained to evaluate the vascular anatomy. CONTRAST:  196m OMNIPAQUE IOHEXOL 350 MG/ML SOLN COMPARISON:  08/23/2016 head CT FINDINGS: CT HEAD Brain: There is an old left caudate small vessel infarct. No acute hemorrhage or extra-axial collection. Brain parenchyma otherwise normal.  Vascular: No abnormal hyperdensity of the major intracranial arteries or dural venous sinuses. No intracranial atherosclerosis. Skull: The visualized skull base, calvarium and extracranial soft tissues are normal. Sinuses/Orbits: No fluid levels or advanced mucosal thickening of the visualized paranasal sinuses. No mastoid or middle ear effusion. The orbits are normal. CTA HEAD POSTERIOR CIRCULATION: --Vertebral arteries: Normal V4 segments. --Inferior cerebellar arteries: Normal. --Basilar artery: Normal. --Superior cerebellar arteries: Normal. --Posterior cerebral arteries: Normal. ANTERIOR CIRCULATION: --Intracranial internal carotid arteries: Normal. --Anterior cerebral arteries (ACA): Normal. Both A1 segments are present. Patent anterior communicating artery (a-comm). --Middle cerebral arteries (MCA): Normal. Venous sinuses: As permitted by contrast timing, patent. Anatomic variants: None IMPRESSION: 1. No intracranial arterial occlusion or high-grade stenosis. 2. No aneurysm or subarachnoid hemorrhage. 3. 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 Electronically Signed   By: KUlyses JarredM.D.   On: 06/04/2019 01:56   MR ANGIO NECK W WO CONTRAST  Result Date: 06/05/2019 CLINICAL DATA:  Follow-up stroke. EXAM: MRA NECK WITHOUT AND WITH CONTRAST TECHNIQUE: Multiplanar and multiecho pulse sequences of the neck were obtained without and with intravenous contrast. Angiographic images of the neck were obtained using MRA technique without and with intravenous contrast. CONTRAST:  146mGADAVIST GADOBUTROL 1 MMOL/ML IV SOLN COMPARISON:  MRI yesterday. FINDINGS: Branching pattern of the aorta is normal. No origin stenosis. Both common carotid arteries widely patent to the bifurcation. Both carotid bifurcations appear normal, widely patent without irregularity. Both vertebral arteries are patent at their origins and through the cervical region to the basilar artery. The study does include the circle-of-Willis, which  appears intact. IMPRESSION: Negative MR angiography of the neck vessels. No stenosis or occlusion. Electronically Signed   By: MaNelson Chimes.D.   On: 06/05/2019 11:15   MR BRAIN WO CONTRAST  Result Date: 06/04/2019 CLINICAL DATA:  [age]ear old male with unexplained altered mental status. Headache with pain behind the right eye.  EXAM: MRI HEAD WITHOUT CONTRAST TECHNIQUE: Multiplanar, multiecho pulse sequences of the brain and surrounding structures were obtained without intravenous contrast. COMPARISON:  CT and CTA head earlier today.  Brain MRI 03/19/2019. FINDINGS: Brain: No restricted diffusion or evidence of acute infarction. Chronic ischemia with encephalomalacia in the left middle frontal gyrus, corona radiata, left lentiform. However, contralateral T2 and FLAIR hyperintense linear lesion of the anterior right frontal lobe white matter is new from 03/19/2019 but most resembles a chronic lacunar infarct (series 8, image 17, series 11, image 35, with facilitated diffusion). Chronic hemosiderin associated with the former, but trace if any microhemorrhage in the new or right frontal lobe lesion. Elsewhere stable gray and white matter signal since March including patchy posterior hemisphere and central pontine T2 and FLAIR hyperintensity. No other cortical encephalomalacia or chronic cerebral blood products identified. Right basal ganglia and thalami remain within normal limits. Questionable tiny chronic infarct in the posterior right cerebellum on series 8, image 7. No midline shift, mass effect, evidence of mass lesion, ventriculomegaly, extra-axial collection or acute intracranial hemorrhage. Cervicomedullary junction and pituitary are within normal limits. Vascular: Major intracranial vascular flow voids are stable since March. Mild generalized intracranial artery tortuosity. Skull and upper cervical spine: Stable and negative. Sinuses/Orbits: Stable and negative. Other: Stable hypertrophied adenoid soft  tissue with no suspicious features. Mastoids and tympanic cavities remain clear. Negative scalp and face soft tissues. IMPRESSION: 1. 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 2. No superimposed acute infarct identified. And otherwise stable chronic ischemia, with a combination of bilateral small vessel disease and medium size vessel involvement in the left MCA territory. Electronically Signed   By: Genevie Ann M.D.   On: 06/04/2019 18:22   ECHOCARDIOGRAM COMPLETE  Result Date: 06/05/2019    ECHOCARDIOGRAM REPORT   Patient Name:   ABEM SHADDIX Date of Exam: 06/05/2019 Medical Rec #:  [id number]       Height:       69.0 in Accession #:    [id number]      Weight:       270.0 lb Date of Birth:  1965/07/19       BSA:          2.347 m Patient Age:    24 years        BP:           147/98 mmHg Patient Gender: M               HR:           56 bpm. Exam Location:  Inpatient Procedure: 2D Echo, Color Doppler and Cardiac Doppler Indications:    TIA 435.9 / G45.9  History:        Patient has prior history of Echocardiogram examinations, most                 recent 10/13/2018. Cardiomyopathy; Risk Factors:Hypertension and                 Family History of Coronary Artery Disease.  Sonographer:    Jonelle Sidle Dance Referring Phys: [id number] [place]  1. Left ventricular ejection fraction, by estimation, is 50 to 55%. The left ventricle has low normal function. The left ventricle has no regional wall motion abnormalities. There is moderate left ventricular hypertrophy. Left ventricular diastolic parameters are consistent with Grade I diastolic dysfunction (impaired relaxation).  2. Right ventricular systolic function is normal. The right ventricular size is normal.  3. The mitral valve is grossly normal. Trivial mitral valve regurgitation.  4. The aortic valve was not well visualized. Aortic valve regurgitation is not visualized. Comparison(s): No significant change  from prior study. 09/23/2018: LVEF 50-55%, inferior hypokinesis. FINDINGS  Left Ventricle: Left ventricular ejection fraction, by estimation, is 50 to 55%. The left ventricle has low normal function. The left ventricle has no regional wall motion abnormalities. The left ventricular internal cavity size was normal in size. There is moderate left ventricular hypertrophy. Left ventricular diastolic parameters are consistent with Grade I diastolic dysfunction (impaired relaxation). Indeterminate filling pressures. Right Ventricle: The right ventricular size is normal. No increase in right ventricular wall thickness. Right ventricular systolic function is normal. Left Atrium: Left atrial size was normal in size. Right Atrium: Right atrial size was normal in size. Pericardium: There is no evidence of pericardial effusion. Mitral Valve: The mitral valve is grossly normal. Trivial mitral valve regurgitation. Tricuspid Valve: The tricuspid valve is grossly normal. Tricuspid valve regurgitation is trivial. Aortic Valve: The aortic valve was not well visualized. Aortic valve regurgitation is not visualized. Pulmonic Valve: The pulmonic valve was not well visualized. Pulmonic valve regurgitation is not visualized. Aorta: The aortic root and ascending aorta are structurally normal, with no evidence of dilitation. IAS/Shunts: The interatrial septum was not well visualized.  LEFT VENTRICLE PLAX 2D LVIDd:         6.40 cm  Diastology LVIDs:         5.00 cm  LV e' lateral:   6.42 cm/s LV PW:         1.40 cm  LV E/e' lateral: 12.0 LV IVS:        1.20 cm  LV e' medial:    4.03 cm/s LVOT diam:     2.40 cm  LV E/e' medial:  19.1 LV SV:         73 LV SV Index:   31 LVOT Area:     4.52 cm  RIGHT VENTRICLE             IVC RV Basal diam:  3.80 cm     IVC diam: 1.85 cm RV Mid diam:    2.20 cm RV S prime:     12.50 cm/s TAPSE (M-mode): 2.4 cm LEFT ATRIUM             Index       RIGHT ATRIUM           Index LA diam:        4.00 cm 1.70 cm/m  RA  Area:     21.70 cm LA Vol (A2C):   68.0 ml 28.97 ml/m RA Volume:   64.80 ml  27.61 ml/m LA Vol (A4C):   44.2 ml 18.83 ml/m LA Biplane Vol: 55.4 ml 23.60 ml/m  AORTIC VALVE LVOT Vmax:   74.20 cm/s LVOT Vmean:  50.700 cm/s LVOT VTI:    0.162 m  AORTA Ao Root diam: 3.60 cm Ao Asc diam:  3.80 cm MITRAL VALVE MV Area (PHT): 2.83 cm    SHUNTS MV Decel Time: 268 msec    Systemic VTI:  0.16 m MV E velocity: 77.10 cm/s  Systemic Diam: 2.40 cm MV A velocity: 92.90 cm/s MV E/A ratio:  0.83 Lyman Bishop MD Electronically signed by Lyman Bishop MD Signature Date/Time: 06/05/2019/10:32:58 AM    Final    US SCROTUM W/DOPPLER  Result Date: 05/26/2019 CLINICAL DATA:  Left scrotal pain and swelling EXAM: SCROTAL ULTRASOUND DOPPLER ULTRASOUND OF  THE TESTICLES TECHNIQUE: Complete ultrasound examination of the testicles, epididymis, and other scrotal structures was performed. Color and spectral Doppler ultrasound were also utilized to evaluate blood flow to the testicles. COMPARISON:  March 19, 2019 FINDINGS: Right testicle Measurements: 4.4 x 3.2 x 2.9 cm. No mass or microlithiasis visualized. Left testicle Measurements: 4.9 x 2.8 x 3.6 cm. No mass or microlithiasis visualized. Right epididymis: Normal in size and appearance. No inflammatory focus evident. Left epididymis: Left epididymis is largely compressed by large hydrocele. No mass or inflammatory focus in the epididymal region evident. Hydrocele: Large hydrocele on each side, larger on the left than on the right. Varicocele:  None visualized. Pulsed Doppler interrogation of both testes demonstrates normal low resistance arterial and venous waveforms bilaterally. Normal appearing appendix testis noted on each side incidentally. No scrotal abscess or scrotal wall thickening on either side. IMPRESSION: Sizable hydroceles bilaterally, larger on the left than on the right. No evident intratesticular or extratesticular mass on either side. No testicular or epididymal  inflammation on either side. No testicular torsion on either side. Electronically Signed   By: Lowella Grip III M.D.   On: 05/26/2019 13:11    Time coordinating discharge: Over 30 minutes  SIGNED:   Guilford Shi, MD  [facility] 06/06/2019, 1:13 PM

## 2019-06-06 NOTE — Evaluation (Signed)
Occupational Therapy Evaluation Patient Details Name: Kenneth Gould MRN: [id number] DOB: 1965-06-14 Today's Date: 06/06/2019    History of Present Illness 54 year old male with a history of well-controlled HTN, hyperlipidemia, NICM /chronic systolic CHF presents with complaint of a severe persistent headache x 1 day - feels as though someone is pulling his right upper socket.  He reports his hands and feet have been numb for couple weeks.  He also reports dizziness- worse on standing, been in bed most of the week because he is so frequently off balance/presyncopal.  He has never passed out.He reports some sinus and right ear congestion but denies any fevers or chills.He denies tinnitus, postnasal drip or hearing loss.During hospitalist interview, he complained of significant  Toothache-  back tooth on the left side x 2 days.  He states the pain radiates from the head around the tooth and left side of his face feels congested swollen.   Clinical Impression   [name] is a 54 year old man admitted to hospital with complaints of persistent headache and dizziness. On evaluation he presents with normal ROM, strength and coordination. Patient demonstrates ability to perform ADLs and mobility. Patient does have mild balance impairments with need to intermittently use hand hold to steady himself but otherwise functional and safe. Discussed safety in bathroom for return home and patient reports he will purchase a shower chair or sit in tub. No OT needs at this time.    Follow Up Recommendations       Equipment Recommendations  None recommended by OT    Recommendations for Other Services       Precautions / Restrictions Precautions Precautions: Fall Restrictions Weight Bearing Restrictions: No      Mobility Bed Mobility Overal bed mobility: Independent                Transfers                 General transfer comment: Patinet standing at door when therapist neared  patient's room and asked if he could ambulate in the hall. Patient able to ambulate in room and in hall approx 100 foot with internmittent touching of hand rails a    Balance   Sitting-balance support: No upper extremity supported;Feet supported Sitting balance-Leahy Scale: Good     Standing balance support: No upper extremity supported;During functional activity Standing balance-Leahy Scale: Fair                             ADL either performed or assessed with clinical judgement   ADL Overall ADL's : Independent                                       General ADL Comments: Patient demonstrated ability to donn lowe body clothing, reports he has been performing toileting independently and without assistance. Reports no difficulties with ADLs.     Vision   Vision Assessment?: No apparent visual deficits     Perception     Praxis      Pertinent Vitals/Pain Pain Assessment: No/denies pain     Hand Dominance Right   Extremity/Trunk Assessment Upper Extremity Assessment Upper Extremity Assessment: Overall WFL for tasks assessed   Lower Extremity Assessment Lower Extremity Assessment: Defer to PT evaluation   Cervical / Trunk Assessment Cervical / Trunk Assessment: Normal   Communication Communication Communication:  No difficulties   Cognition                                           General Comments       Exercises     Shoulder Instructions      Home Living Family/patient expects to be discharged to:: Private residence Living Arrangements: Alone Available Help at Discharge: Friend(s);Available PRN/intermittently Type of Home: House Home Access: Stairs to enter [employer] of Steps: 4 Entrance Stairs-Rails: Left Home Layout: Two level;Able to live on main level with bedroom/bathroom Alternate Level Stairs-Number of Steps: 13 to second level Alternate Level Stairs-Rails: Left Bathroom Shower/Tub:  Tub/shower unit;Curtain   Bathroom Toilet: Standard Bathroom Accessibility: No   Home Equipment: None   Additional Comments: Reports he is going to get a shower chair and until then sit down in the tub.      Prior Functioning/Environment Level of Independence: Independent                 OT Problem List: Impaired balance (sitting and/or standing)      OT Treatment/Interventions:      OT Goals(Current goals can be found in the care plan section) Acute Rehab OT Goals Patient Stated Goal: Stop being dizzy and return to normal life. OT Goal Formulation: All assessment and education complete, DC therapy  OT Frequency:     Barriers to D/C:            Co-evaluation              AM-PAC OT "6 Clicks" Daily Activity     Outcome Measure Help from another person eating meals?: None Help from another person taking care of personal grooming?: None Help from another person toileting, which includes using toliet, bedpan, or urinal?: None Help from another person bathing (including washing, rinsing, drying)?: None Help from another person to put on and taking off regular upper body clothing?: None Help from another person to put on and taking off regular lower body clothing?: None 6 Click Score: 24   End of Session    Activity Tolerance: Patient tolerated treatment well Patient left: in chair  OT Visit Diagnosis: Unsteadiness on feet (R26.81)                Time: 8032-1224 OT Time Calculation (min): 8 min Charges:  OT General Charges $OT Visit: 1 Visit OT Evaluation $OT Eval Low Complexity: 1 Low  Arva Slaugh, OTR/L Acute Care Rehab Services  Office [phone]   Lenward Chancellor 06/06/2019, 1:32 PM

## 2019-06-06 NOTE — Plan of Care (Signed)
  Problem: Acute Rehab PT Goals(only PT should resolve) Goal: Patient Will Transfer Sit To/From Stand Outcome: Progressing Flowsheets (Taken 06/06/2019 [phone]) Patient will transfer sit to/from stand: with modified independence Goal: Pt Will Transfer Bed To Chair/Chair To Bed Outcome: Progressing Flowsheets (Taken 06/06/2019 0928) Pt will Transfer Bed to Chair/Chair to Bed: with modified independence Goal: Pt Will Ambulate Outcome: Progressing Flowsheets (Taken 06/06/2019 0928) Pt will Ambulate:  > 125 feet  with least restrictive assistive device  with modified independence Goal: Pt Will Go Up/Down Stairs Outcome: Progressing Flowsheets (Taken 06/06/2019 0928) Pt will Go Up / Down Stairs:  3-5 stairs  with modified independence  with least restrictive assistive device   Britta Mccreedy D. Hartnett-Rands, MS, PT [facility] PT [facility] [phone] 06/06/2019

## 2019-06-07 LAB — CERULOPLASMIN: Ceruloplasmin: 26.3 mg/dL (ref 16.0–31.0)

## 2019-06-07 LAB — INTRINSIC FACTOR ANTIBODIES: Intrinsic Factor: 0.9 AU/mL (ref 0.0–1.1)

## 2019-06-08 LAB — COPPER, SERUM: Copper: 115 ug/dL (ref 69–132)

## 2019-06-10 LAB — VITAMIN B1: Vitamin B1 (Thiamine): 149.8 nmol/L (ref 66.5–200.0)

## 2019-06-19 DIAGNOSIS — E559 Vitamin D deficiency, unspecified: Secondary | ICD-10-CM

## 2019-06-19 DIAGNOSIS — R7303 Prediabetes: Secondary | ICD-10-CM

## 2019-06-19 HISTORY — DX: Vitamin D deficiency, unspecified: E55.9

## 2019-06-19 HISTORY — DX: Prediabetes: R73.03

## 2019-06-29 ENCOUNTER — Ambulatory Visit: Payer: Managed Care, Other (non HMO) | Admitting: Family Medicine

## 2019-06-29 ENCOUNTER — Other Ambulatory Visit: Payer: Self-pay

## 2019-06-29 ENCOUNTER — Encounter: Payer: Self-pay | Admitting: Family Medicine

## 2019-06-29 VITALS — BP 148/93 | HR 70 | Temp 98.4°F | Ht 68.0 in | Wt 302.0 lb

## 2019-06-29 DIAGNOSIS — Z7689 Persons encountering health services in other specified circumstances: Secondary | ICD-10-CM

## 2019-06-29 DIAGNOSIS — R7303 Prediabetes: Secondary | ICD-10-CM

## 2019-06-29 DIAGNOSIS — I5022 Chronic systolic (congestive) heart failure: Secondary | ICD-10-CM

## 2019-06-29 DIAGNOSIS — E538 Deficiency of other specified B group vitamins: Secondary | ICD-10-CM

## 2019-06-29 DIAGNOSIS — Z09 Encounter for follow-up examination after completed treatment for conditions other than malignant neoplasm: Secondary | ICD-10-CM

## 2019-06-29 DIAGNOSIS — Z0000 Encounter for general adult medical examination without abnormal findings: Secondary | ICD-10-CM

## 2019-06-29 DIAGNOSIS — N433 Hydrocele, unspecified: Secondary | ICD-10-CM

## 2019-06-29 DIAGNOSIS — I160 Hypertensive urgency: Secondary | ICD-10-CM

## 2019-06-29 DIAGNOSIS — R519 Headache, unspecified: Secondary | ICD-10-CM | POA: Diagnosis not present

## 2019-06-29 DIAGNOSIS — I639 Cerebral infarction, unspecified: Secondary | ICD-10-CM

## 2019-06-29 DIAGNOSIS — I10 Essential (primary) hypertension: Secondary | ICD-10-CM

## 2019-06-29 DIAGNOSIS — K047 Periapical abscess without sinus: Secondary | ICD-10-CM

## 2019-06-29 MED ORDER — LISINOPRIL 20 MG PO TABS: 20.0000 mg | ORAL_TABLET | Freq: Every day | ORAL | 3 refills | Status: DC

## 2019-06-29 MED ORDER — HYDROCHLOROTHIAZIDE 12.5 MG PO CAPS: 12.5000 mg | ORAL_CAPSULE | Freq: Every day | ORAL | 3 refills | Status: DC

## 2019-06-29 MED ORDER — CYANOCOBALAMIN 2000 MCG PO TABS: 2000.0000 ug | ORAL_TABLET | Freq: Every day | ORAL | 2 refills | Status: AC

## 2019-06-29 NOTE — Progress Notes (Signed)
[facility]   New Patient--Hospital Follow--Establish Care  Subjective:  Patient ID: Kenneth Gould, male    DOB: 03-30-1965  Age: 54 y.o. MRN: [id number]  CC:  Chief Complaint  Patient presents with  . New Patient (Initial Visit)    Pt stated--have dizziness, headache, balance issue---because wisdom teeth--requesting note to go back to work    HPI Kenneth Gould is a 54 year old male who presents for Hospial Follow Up today.  Past Medical History:  Diagnosis Date  . Cerebral vascular accident (HCC) 05/2019  . CHF (congestive heart failure) (HCC)   . Essential hypertension 10/24/2014  . Family history of premature CAD   . NICM (nonischemic cardiomyopathy) (HCC)    a. 10/2014 Cath: nl cors, EF 35-45%, glob HK.  . Obesity (BMI 30-39.9)   . Vitamin B12 deficiency 05/2019  . Vitamin D deficiency 06/2019    Patient Active Problem List   Diagnosis Date Noted  . Infection of tooth socket 06/06/2019  . Vitamin B12 deficiency 06/06/2019  . 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 Marijuana abuse 06/06/2019  . Headache 06/04/2019  . CVA (cerebral vascular accident) (HCC) 06/04/2019  . At risk for inadequate pain control 06/04/2019  . Congestive heart failure (CHF) (HCC) 10/12/2018  . Atypical chest pain 08/24/2016  . Chronic systolic CHF (congestive heart failure) (HCC)   . Chest pain   . Unstable angina pectoris (HCC) 10/25/2014  . NICM (nonischemic cardiomyopathy) (HCC) 10/25/2014  . Chest pain with moderate risk for cardiac etiology 10/24/2014  . Essential hypertension 10/24/2014  . Chest pain, moderate coronary artery risk 10/24/2014     Current Status: This will be his initial office visit with me. He was previously seeing a Physician for his PCP needs. He has recently relocated from [place], [place] and has been in [place] X 6 months. Since his last office visit, he has has multiple ED visits. Today, he is doing well with no  complaints. He is requesting to return back to work. He has follow up with Neurologist in a few weeks for recent stroke. His blood pressures are elevated today, which he states that he has not taken his blood pressure medications as of yet today. He denies visual changes, chest pain, cough, shortness of breath, heart palpitations, and falls. He has occasional headaches and dizziness with position changes. Denies severe headaches, confusion, seizures, double vision, and blurred vision, nausea and vomiting. He is currently receiving Vitamin B-12 injections. He has multiple dental caries and is currently working with Designer, industrial/product for insurance clearance before surgery is scheduled. He denies fevers, chills, fatigue, recent infections, weight loss, and night sweats. Denies GI problems such as diarrhea, and constipation. He has no reports of blood in stools, dysuria and hematuria. No depression or anxiety reported today. He denies suicidal ideations, homicidal ideations, or auditory hallucinations. He is taking all medications as prescribed. He denies pain today.   Past Surgical History:  Procedure Laterality Date  . CARDIAC CATHETERIZATION N/A 10/25/2014   Procedure: Left Heart Cath and Coronary Angiography;  Surgeon: Lyn Records, MD;  Location: [facility] INVASIVE CV LAB;  Service: Cardiovascular;  Laterality: N/A;  . None      Family History  Problem Relation Age of Onset  . Heart attack Sister 36       Died in her sleep, dx at autopsy  . Heart attack Cousin 54       Died in his sleep    Social  History   Socioeconomic History  . Marital status: Married    Spouse name: Not on file  . Number of children: Not on file  . Years of education: Not on file  . Highest education level: Not on file  Occupational History  . Occupation: Truck Geophysicist/field seismologist  Tobacco Use  . Smoking status: Current Every Day Smoker    Packs/day: 0.50    Years: 30.00    Pack years: 15.00    Types: Cigarettes    Last attempt to  quit: 08/25/2015    Years since quitting: 3.8  . Smokeless tobacco: Never Used  Vaping Use  . Vaping Use: Never used  Substance and Sexual Activity  . Alcohol use: No  . Drug use: Yes    Types: Marijuana    Comment: THC  . Sexual activity: Yes  Other Topics Concern  . Not on file  Social History Narrative  . Not on file   Social Determinants of Health   Financial Resource Strain:   . Difficulty of Paying Living Expenses:   Food Insecurity:   . Worried About Charity fundraiser in the Last Year:   . Arboriculturist in the Last Year:   Transportation Needs:   . Film/video editor (Medical):   Marland Kitchen Lack of Transportation (Non-Medical):   Physical Activity:   . Days of Exercise per Week:   . Minutes of Exercise per Session:   Stress:   . Feeling of Stress :   Social Connections:   . Frequency of Communication with Friends and Family:   . Frequency of Social Gatherings with Friends and Family:   . Attends Religious Services:   . Active Member of Clubs or Organizations:   . Attends Archivist Meetings:   Marland Kitchen Marital Status:   Intimate Partner Violence:   . Fear of Current or Ex-Partner:   . Emotionally Abused:   Marland Kitchen Physically Abused:   . Sexually Abused:     Outpatient Medications Prior to Visit  Medication Sig Dispense Refill  . albuterol (VENTOLIN HFA) 108 (90 Base) MCG/ACT inhaler Inhale 2 puffs into the lungs every 6 (six) hours as needed for wheezing or shortness of breath.    Marland Kitchen aspirin 81 MG chewable tablet Chew 1 tablet (81 mg total) by mouth daily. 30 tablet 0  . atorvastatin (LIPITOR) 40 MG tablet Take 1 tablet (40 mg total) by mouth daily. 30 tablet 1  . butalbital-acetaminophen-caffeine (FIORICET) 50-325-40 MG tablet Take 1 tablet by mouth every 12 (twelve) hours as needed for headache or migraine. 14 tablet 0  . furosemide (LASIX) 20 MG tablet Take 1 tablet (20 mg total) by mouth daily. 30 tablet 0  . polyethylene glycol (MIRALAX / GLYCOLAX) 17 g packet  Take 17 g by mouth daily as needed for mild constipation. 14 each 0  . lisinopril (ZESTRIL) 20 MG tablet Take 1 tablet (20 mg total) by mouth daily. 30 tablet 3  . vitamin B-12 2000 MCG tablet Take 1 tablet (2,000 mcg total) by mouth daily. (Patient not taking: Reported on 06/29/2019) 30 tablet 1   No facility-administered medications prior to visit.    Allergies  Allergen Reactions  . Nyquil Multi-Symptom [Pseudoeph-Doxylamine-Dm-Apap] Nausea And Vomiting    ROS Review of Systems  Constitutional: Negative.   HENT: Positive for dental problem (multiple dental caries).   Eyes: Negative.   Respiratory: Negative.   Cardiovascular: Negative.   Gastrointestinal: Positive for abdominal distention (obese).  Endocrine: Negative.  Genitourinary: Negative.   Musculoskeletal: Negative.   Skin: Negative.   Allergic/Immunologic: Negative.   Neurological: Positive for dizziness (occasional ) and headaches (occasional).  Hematological: Negative.   Psychiatric/Behavioral: Negative.    Objective:    Physical Exam Vitals and nursing note reviewed.  Constitutional:      Appearance: Normal appearance.  HENT:     Head: Normocephalic and atraumatic.     Nose: Nose normal.     Mouth/Throat:     Mouth: Mucous membranes are moist.     Pharynx: Oropharynx is clear.  Cardiovascular:     Rate and Rhythm: Normal rate and regular rhythm.     Pulses: Normal pulses.     Heart sounds: Normal heart sounds.  Pulmonary:     Effort: Pulmonary effort is normal.     Breath sounds: Normal breath sounds.  Abdominal:     General: There is distension (obese).  Musculoskeletal:        General: Normal range of motion.     Cervical back: Normal range of motion and neck supple.  Skin:    General: Skin is warm and dry.  Neurological:     Mental Status: He is alert.  Psychiatric:        Mood and Affect: Mood normal.        Behavior: Behavior normal.        Thought Content: Thought content normal.         Judgment: Judgment normal.     BP (!) 148/93   Pulse 70   Temp 98.4 F (36.9 C)   Ht 5\' 8"  (1.727 m)   Wt (!) 302 lb (137 kg)   SpO2 94%   BMI 45.92 kg/m  Wt Readings from Last 3 Encounters:  06/29/19 (!) 302 lb (137 kg)  06/06/19 (!) 310 lb (140.6 kg)  03/26/19 270 lb (122.5 kg)     Health Maintenance Due  Topic Date Due  . Hepatitis C Screening  Never done  . COVID-19 Vaccine (1) Never done  . TETANUS/TDAP  Never done  . COLONOSCOPY  Never done    There are no preventive care reminders to display for this patient.  Lab Results  Component Value Date   TSH 0.995 06/04/2019   Lab Results  Component Value Date   WBC 6.8 06/05/2019   HGB 17.4 (H) 06/05/2019   HCT 54.0 (H) 06/05/2019   MCV 86.8 06/05/2019   PLT 171 06/05/2019   Lab Results  Component Value Date   NA 135 06/05/2019   K 4.6 06/05/2019   CO2 25 06/05/2019   GLUCOSE 196 (H) 06/05/2019   BUN 14 06/05/2019   CREATININE 1.15 06/05/2019   BILITOT 0.8 06/29/2019   ALKPHOS 129 (H) 06/29/2019   AST 13 06/29/2019   ALT 12 06/29/2019   PROT 7.8 06/29/2019   ALBUMIN 4.5 06/29/2019   CALCIUM 8.9 06/05/2019   ANIONGAP 10 06/05/2019   Lab Results  Component Value Date   CHOL 204 (H) 06/05/2019   Lab Results  Component Value Date   HDL 45 06/05/2019   Lab Results  Component Value Date   LDLCALC 151 (H) 06/05/2019   Lab Results  Component Value Date   TRIG 42 06/05/2019   Lab Results  Component Value Date   CHOLHDL 4.5 06/05/2019   Lab Results  Component Value Date   HGBA1C 6.1 (H) 06/05/2019      Assessment & Plan:   1. Hospital discharge follow-up  2. Encounter to establish  care  3. Acute intractable headache, unspecified headache type Stable today.   4. Hypertensive urgency He received Clonidine 0.3 mg in our office today. Stabilized today at 148/93. He will report to ED if she eperiences severe headaches, confusion, seizures, double vision, and blurred vision, nausea and  vomiting. He will continue to take HCTZ and Lisinopril daily as prescribed.  - Clonidine 0.3 mg  5. Hypertension, unspecified type - lisinopril (ZESTRIL) 20 MG tablet; Take 1 tablet (20 mg total) by mouth daily.  Dispense: 90 tablet; Refill: 3 - hydrochlorothiazide (MICROZIDE) 12.5 MG capsule; Take 1 capsule (12.5 mg total) by mouth daily.  Dispense: 90 capsule; Refill: 3  6. Cerebrovascular accident (CVA), unspecified mechanism (HCC) Stable. No signs or symptoms of reoccurrence noted or reported today.   7. Chronic systolic congestive heart failure (HCC)  8. Hydrocele, left He has an upcoming appointment with Urology.   9. Dental abscess He will keep appointment with Dentist as scheduled.   10. Prediabetes  11. Vitamin B12 deficiency - cyanocobalamin 2000 MCG tablet; Take 1 tablet (2,000 mcg total) by mouth daily.  Dispense: 30 tablet; Refill: 2  12. Healthcare maintenance - Hepatic Function Panel - Vitamin D, 25-hydroxy - PSA  13. Follow up He will follow up in 1 month.   Meds ordered this encounter  Medications  . lisinopril (ZESTRIL) 20 MG tablet    Sig: Take 1 tablet (20 mg total) by mouth daily.    Dispense:  90 tablet    Refill:  3  . hydrochlorothiazide (MICROZIDE) 12.5 MG capsule    Sig: Take 1 capsule (12.5 mg total) by mouth daily.    Dispense:  90 capsule    Refill:  3  . cyanocobalamin 2000 MCG tablet    Sig: Take 1 tablet (2,000 mcg total) by mouth daily.    Dispense:  30 tablet    Refill:  2    Orders Placed This Encounter  Procedures  . Hepatic Function Panel  . Vitamin D, 25-hydroxy  . PSA    Referral Orders  No referral(s) requested today    Raliegh Ip,  MSN, FNP-BC [facility] [address] [place], [place] [address] [phone] fax   Problem List Items Addressed This Visit      Cardiovascular and Mediastinum   Congestive heart failure (CHF)  (HCC)   Relevant Medications   lisinopril (ZESTRIL) 20 MG tablet   hydrochlorothiazide (MICROZIDE) 12.5 MG capsule   CVA (cerebral vascular accident) (HCC)   Relevant Medications   lisinopril (ZESTRIL) 20 MG tablet   hydrochlorothiazide (MICROZIDE) 12.5 MG capsule     Other   Headache   Vitamin B12 deficiency   Relevant Medications   cyanocobalamin 2000 MCG tablet    Other Visit Diagnoses    Hospital discharge follow-up    -  Primary   Encounter to establish care       Hypertensive urgency       Relevant Medications   lisinopril (ZESTRIL) 20 MG tablet   hydrochlorothiazide (MICROZIDE) 12.5 MG capsule   Hypertension, unspecified type       Relevant Medications   lisinopril (ZESTRIL) 20 MG tablet   hydrochlorothiazide (MICROZIDE) 12.5 MG capsule   Hydrocele, left       Dental abscess       Prediabetes       Healthcare maintenance       Relevant Orders   Hepatic Function Panel (Completed)  Vitamin D, 25-hydroxy (Completed)   PSA (Completed)   Follow up          Meds ordered this encounter  Medications  . lisinopril (ZESTRIL) 20 MG tablet    Sig: Take 1 tablet (20 mg total) by mouth daily.    Dispense:  90 tablet    Refill:  3  . hydrochlorothiazide (MICROZIDE) 12.5 MG capsule    Sig: Take 1 capsule (12.5 mg total) by mouth daily.    Dispense:  90 capsule    Refill:  3  . cyanocobalamin 2000 MCG tablet    Sig: Take 1 tablet (2,000 mcg total) by mouth daily.    Dispense:  30 tablet    Refill:  2    Follow-up: No follow-ups on file.    Kallie Locks, FNP

## 2019-06-30 ENCOUNTER — Encounter: Payer: Self-pay | Admitting: Family Medicine

## 2019-06-30 ENCOUNTER — Other Ambulatory Visit: Payer: Self-pay | Admitting: Family Medicine

## 2019-06-30 DIAGNOSIS — E559 Vitamin D deficiency, unspecified: Secondary | ICD-10-CM

## 2019-06-30 LAB — HEPATIC FUNCTION PANEL
ALT: 12 IU/L (ref 0–44)
AST: 13 IU/L (ref 0–40)
Albumin: 4.5 g/dL (ref 3.8–4.9)
Alkaline Phosphatase: 129 IU/L — ABNORMAL HIGH (ref 48–121)
Bilirubin Total: 0.8 mg/dL (ref 0.0–1.2)
Bilirubin, Direct: 0.22 mg/dL (ref 0.00–0.40)
Total Protein: 7.8 g/dL (ref 6.0–8.5)

## 2019-06-30 LAB — VITAMIN D 25 HYDROXY (VIT D DEFICIENCY, FRACTURES): Vit D, 25-Hydroxy: 14.2 ng/mL — ABNORMAL LOW (ref 30.0–100.0)

## 2019-06-30 LAB — PSA: Prostate Specific Ag, Serum: 0.7 ng/mL (ref 0.0–4.0)

## 2019-06-30 MED ORDER — VITAMIN D (ERGOCALCIFEROL) 1.25 MG (50000 UNIT) PO CAPS: 50000.0000 [IU] | ORAL_CAPSULE | ORAL | 2 refills | Status: AC

## 2019-07-01 ENCOUNTER — Encounter: Payer: Self-pay | Admitting: Family Medicine

## 2019-07-01 MED ORDER — CLONIDINE HCL 0.2 MG PO TABS
0.3000 mg | ORAL_TABLET | Freq: Once | ORAL | Status: DC
Start: 2019-07-01 — End: 2020-12-17

## 2019-07-02 ENCOUNTER — Other Ambulatory Visit: Payer: Self-pay

## 2019-07-02 ENCOUNTER — Emergency Department (HOSPITAL_COMMUNITY)
Admission: EM | Admit: 2019-07-02 | Discharge: 2019-07-02 | Disposition: A | Discharge status: Home / Self Care | Payer: Managed Care, Other (non HMO) | Attending: Emergency Medicine | Admitting: Emergency Medicine

## 2019-07-02 DIAGNOSIS — I5022 Chronic systolic (congestive) heart failure: Secondary | ICD-10-CM | POA: Diagnosis not present

## 2019-07-02 DIAGNOSIS — R202 Paresthesia of skin: Secondary | ICD-10-CM | POA: Insufficient documentation

## 2019-07-02 DIAGNOSIS — E538 Deficiency of other specified B group vitamins: Secondary | ICD-10-CM | POA: Diagnosis not present

## 2019-07-02 DIAGNOSIS — Z79899 Other long term (current) drug therapy: Secondary | ICD-10-CM | POA: Insufficient documentation

## 2019-07-02 DIAGNOSIS — F17210 Nicotine dependence, cigarettes, uncomplicated: Secondary | ICD-10-CM | POA: Diagnosis not present

## 2019-07-02 DIAGNOSIS — I10 Essential (primary) hypertension: Secondary | ICD-10-CM

## 2019-07-02 DIAGNOSIS — I110 Hypertensive heart disease with heart failure: Secondary | ICD-10-CM | POA: Insufficient documentation

## 2019-07-02 LAB — CBC WITH DIFFERENTIAL/PLATELET
Abs Immature Granulocytes: 0.02 10*3/uL (ref 0.00–0.07)
Basophils Absolute: 0.1 10*3/uL (ref 0.0–0.1)
Basophils Relative: 1 %
Eosinophils Absolute: 0.1 10*3/uL (ref 0.0–0.5)
Eosinophils Relative: 2 %
HCT: 53.7 % — ABNORMAL HIGH (ref 39.0–52.0)
Hemoglobin: 18 g/dL — ABNORMAL HIGH (ref 13.0–17.0)
Immature Granulocytes: 0 %
Lymphocytes Relative: 24 %
Lymphs Abs: 1.6 10*3/uL (ref 0.7–4.0)
MCH: 28.6 pg (ref 26.0–34.0)
MCHC: 33.5 g/dL (ref 30.0–36.0)
MCV: 85.4 fL (ref 80.0–100.0)
Monocytes Absolute: 0.5 10*3/uL (ref 0.1–1.0)
Monocytes Relative: 7 %
Neutro Abs: 4.6 10*3/uL (ref 1.7–7.7)
Neutrophils Relative %: 66 %
Platelets: 212 10*3/uL (ref 150–400)
RBC: 6.29 MIL/uL — ABNORMAL HIGH (ref 4.22–5.81)
RDW: 14.6 % (ref 11.5–15.5)
WBC: 6.9 10*3/uL (ref 4.0–10.5)
nRBC: 0 % (ref 0.0–0.2)

## 2019-07-02 LAB — BASIC METABOLIC PANEL
Anion gap: 10 (ref 5–15)
BUN: 10 mg/dL (ref 6–20)
CO2: 26 mmol/L (ref 22–32)
Calcium: 9.3 mg/dL (ref 8.9–10.3)
Chloride: 101 mmol/L (ref 98–111)
Creatinine, Ser: 1.1 mg/dL (ref 0.61–1.24)
GFR calc Af Amer: >60 mL/min (ref >60)
GFR calc non Af Amer: >60 mL/min (ref >60)
Glucose, Bld: 146 mg/dL — ABNORMAL HIGH (ref 70–99)
Potassium: 3.9 mmol/L (ref 3.5–5.1)
Sodium: 137 mmol/L (ref 135–145)

## 2019-07-02 LAB — CBG MONITORING, ED: Glucose-Capillary: 166 mg/dL — ABNORMAL HIGH (ref 70–99)

## 2019-07-02 MED ORDER — CYANOCOBALAMIN 1000 MCG/ML IJ SOLN
1000.0000 ug | Freq: Once | INTRAMUSCULAR | Status: AC
  Administered 2019-07-02: 1000 ug via INTRAMUSCULAR
  Filled 2019-07-02: qty 1

## 2019-07-02 MED ORDER — SODIUM CHLORIDE 0.9 % IV BOLUS
1000.0000 mL | Freq: Once | INTRAVENOUS | Status: AC
  Administered 2019-07-02: 1000 mL via INTRAVENOUS

## 2019-07-02 MED ORDER — LABETALOL HCL 5 MG/ML IV SOLN
20.0000 mg | Freq: Once | INTRAVENOUS | Status: AC
  Administered 2019-07-02: 20 mg via INTRAVENOUS
  Filled 2019-07-02: qty 4

## 2019-07-02 MED ORDER — LORAZEPAM 2 MG/ML IJ SOLN
1.0000 mg | Freq: Once | INTRAMUSCULAR | Status: AC
  Administered 2019-07-02: 1 mg via INTRAVENOUS
  Filled 2019-07-02: qty 1

## 2019-07-02 NOTE — ED Notes (Signed)
MD notified of pt's HTN 

## 2019-07-02 NOTE — Discharge Instructions (Addendum)
Make sure you are taking your vitamin B12 (cyanocobalamin) tablets every day.

## 2019-07-02 NOTE — ED Triage Notes (Signed)
Pt arrives via [facility] stretcher c/o paresthesias in all extremities that started one hour previous while at home watching tv. Pt reported blurry vision at beginning of episode. EMS reports patient coordination intact and pt passed in the field stroke screen. Pt arrives A+Ox4 on room air.

## 2019-07-02 NOTE — ED Notes (Signed)
Patient verbalizes understanding of discharge instructions . Opportunity for questions and answers were provided . Armband removed by staff ,Pt discharged from ED. W/C  offered at D/C  and Declined W/C at D/C and was escorted to lobby by RN.  

## 2019-07-02 NOTE — ED Provider Notes (Addendum)
[facility] EMERGENCY DEPARTMENT Provider Note   CSN: [id number] Arrival date & time: 07/02/19  0401     History Chief Complaint  Patient presents with  . Numbness  . stroke like symptoms    Kenneth Gould is a 54 y.o. male.  Patient presents to the emergency department for evaluation of numbness and tingling of hands and feet.  Patient reports that it feels like "pins-and-needles".  Symptoms are symmetric in both extremities.  He reports that he just started to feel the symptoms earlier this morning.  No headache or vision change.  No speech difficulty.  No unilateral or asymmetric symptoms.  He does not have any weakness associated with the numbness and tingling.        Past Medical History:  Diagnosis Date  . Cerebral vascular accident (HCC) 05/2019  . CHF (congestive heart failure) (HCC)   . Essential hypertension 10/24/2014  . Family history of premature CAD   . NICM (nonischemic cardiomyopathy) (HCC)    a. 10/2014 Cath: nl cors, EF 35-45%, glob HK.  . Obesity (BMI 30-39.9)   . Prediabetes 06/2019  . Vitamin B12 deficiency 05/2019  . Vitamin D deficiency 06/2019    Patient Active Problem List   Diagnosis Date Noted  . Infection of tooth socket 06/06/2019  . Vitamin B12 deficiency 06/06/2019  . 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 Marijuana abuse 06/06/2019  . Headache 06/04/2019  . CVA (cerebral vascular accident) (HCC) 06/04/2019  . At risk for inadequate pain control 06/04/2019  . Congestive heart failure (CHF) (HCC) 10/12/2018  . Atypical chest pain 08/24/2016  . Chronic systolic CHF (congestive heart failure) (HCC)   . Chest pain   . Unstable angina pectoris (HCC) 10/25/2014  . NICM (nonischemic cardiomyopathy) (HCC) 10/25/2014  . Chest pain with moderate risk for cardiac etiology 10/24/2014  . Essential hypertension 10/24/2014  . Chest pain, moderate coronary artery risk 10/24/2014    Past Surgical History:  Procedure Laterality Date  .  CARDIAC CATHETERIZATION N/A 10/25/2014   Procedure: Left Heart Cath and Coronary Angiography;  Surgeon: Lyn Records, MD;  Location: [facility] INVASIVE CV LAB;  Service: Cardiovascular;  Laterality: N/A;  . None         Family History  Problem Relation Age of Onset  . Heart attack Sister 88       Died in her sleep, dx at autopsy  . Heart attack Cousin 59       Died in his sleep    Social History   Tobacco Use  . Smoking status: Current Every Day Smoker    Packs/day: 0.50    Years: 30.00    Pack years: 15.00    Types: Cigarettes    Last attempt to quit: 08/25/2015    Years since quitting: 3.8  . Smokeless tobacco: Never Used  Vaping Use  . Vaping Use: Never used  Substance Use Topics  . Alcohol use: No  . Drug use: Yes    Types: Marijuana    Comment: THC    Home Medications Prior to Admission medications   Medication Sig Start Date End Date Taking? Authorizing Provider  albuterol (VENTOLIN HFA) 108 (90 Base) MCG/ACT inhaler Inhale 2 puffs into the lungs every 6 (six) hours as needed for wheezing or shortness of breath.    [provider]  aspirin 81 MG chewable tablet Chew 1 tablet (81 mg total) by mouth daily. 03/19/19   Hunter, [place], MD  atorvastatin (LIPITOR) 40 MG tablet Take 1  tablet (40 mg total) by mouth daily. 06/06/19   Alessandra Bevels, MD  butalbital-acetaminophen-caffeine (FIORICET) 50-325-40 MG tablet Take 1 tablet by mouth every 12 (twelve) hours as needed for headache or migraine. 06/06/19   Alessandra Bevels, MD  cyanocobalamin 2000 MCG tablet Take 1 tablet (2,000 mcg total) by mouth daily. 06/29/19 09/27/19  Kallie Locks, FNP  furosemide (LASIX) 20 MG tablet Take 1 tablet (20 mg total) by mouth daily. 10/05/18   Gilda Crease, MD  hydrochlorothiazide (MICROZIDE) 12.5 MG capsule Take 1 capsule (12.5 mg total) by mouth daily. 06/29/19   Kallie Locks, FNP  lisinopril (ZESTRIL) 20 MG tablet Take 1 tablet (20 mg total) by mouth daily. 06/29/19    Kallie Locks, FNP  polyethylene glycol (MIRALAX / GLYCOLAX) 17 g packet Take 17 g by mouth daily as needed for mild constipation. 06/06/19   Alessandra Bevels, MD  Vitamin D, Ergocalciferol, (DRISDOL) 1.25 MG (50000 UNIT) CAPS capsule Take 1 capsule (50,000 Units total) by mouth every 7 (seven) days. (X 12 weeks) 06/30/19 09/28/19  Kallie Locks, FNP  famotidine (PEPCID) 20 MG tablet Take 1 tablet (20 mg total) by mouth 2 (two) times daily. Patient not taking: Reported on 10/05/2018 08/26/16 10/05/18  Joseph Art, DO    Allergies    Nyquil multi-symptom [pseudoeph-doxylamine-dm-apap]  Review of Systems   Review of Systems  Neurological: Positive for numbness.  All other systems reviewed and are negative.   Physical Exam Updated Vital Signs BP (!) 218/135   Pulse 75   Temp 97.8 F (36.6 C) (Oral)   Resp 13   Ht 5\' 8"  (1.727 m)   Wt 136.1 kg   SpO2 97%   BMI 45.61 kg/m   Physical Exam Vitals and nursing note reviewed.  Constitutional:      General: He is not in acute distress.    Appearance: Normal appearance. He is well-developed.  HENT:     Head: Normocephalic and atraumatic.     Right Ear: Hearing normal.     Left Ear: Hearing normal.     Nose: Nose normal.  Eyes:     Conjunctiva/sclera: Conjunctivae normal.     Pupils: Pupils are equal, round, and reactive to light.  Cardiovascular:     Rate and Rhythm: Regular rhythm.     Heart sounds: S1 normal and S2 normal. No murmur heard.  No friction rub. No gallop.   Pulmonary:     Effort: Pulmonary effort is normal. No respiratory distress.     Breath sounds: Normal breath sounds.  Chest:     Chest wall: No tenderness.  Abdominal:     General: Bowel sounds are normal.     Palpations: Abdomen is soft.     Tenderness: There is no abdominal tenderness. There is no guarding or rebound. Negative signs include Murphy's sign and McBurney's sign.     Hernia: No hernia is present.  Musculoskeletal:        General:  Normal range of motion.     Cervical back: Normal range of motion and neck supple.  Skin:    General: Skin is warm and dry.     Findings: No rash.  Neurological:     Mental Status: He is alert and oriented to person, place, and time.     GCS: GCS eye subscore is 4. GCS verbal subscore is 5. GCS motor subscore is 6.     Cranial Nerves: No cranial nerve deficit.     Sensory:  No sensory deficit.     Coordination: Coordination normal.  Psychiatric:        Mood and Affect: Mood is anxious.        Speech: Speech normal.        Behavior: Behavior normal.        Thought Content: Thought content normal.     ED Results / Procedures / Treatments   Labs (all labs ordered are listed, but only abnormal results are displayed) Labs Reviewed  CBC WITH DIFFERENTIAL/PLATELET - Abnormal; Notable for the following components:      Result Value   RBC 6.29 (*)    Hemoglobin 18.0 (*)    HCT 53.7 (*)    All other components within normal limits  BASIC METABOLIC PANEL - Abnormal; Notable for the following components:   Glucose, Bld 146 (*)    All other components within normal limits    EKG EKG Interpretation  Date/Time:  Monday July 02 2019 04:06:00 EDT Ventricular Rate:  61 PR Interval:    QRS Duration: 106 QT Interval:  438 QTC Calculation: 442 R Axis:   -56 Text Interpretation: Sinus rhythm Atrial premature complexes Left anterior fascicular block Left ventricular hypertrophy Confirmed by Gilda Crease [phone]) on 07/02/2019 5:02:56 AM   Radiology No results found.  Procedures Procedures (including critical care time)  Medications Ordered in ED Medications  labetalol (NORMODYNE) injection 20 mg (has no administration in time range)  sodium chloride 0.9 % bolus 1,000 mL (0 mLs Intravenous Stopped 07/02/19 0628)  LORazepam (ATIVAN) injection 1 mg (1 mg Intravenous Given 07/02/19 0526)  cyanocobalamin ((VITAMIN B-12)) injection 1,000 mcg (1,000 mcg Intramuscular Given 07/02/19  8588)    ED Course  I have reviewed the triage vital signs and the nursing notes.  Pertinent labs & imaging results that were available during my care of the patient were reviewed by me and considered in my medical decision making (see chart for details).    MDM Rules/Calculators/A&P                          Patient presents to the emergency department for evaluation of symmetric paresthesias in all 4 extremities.  He does not have any neurologic deficit on examination.  Despite the sensation of paresthesia, he has normal sensory function bilaterally.  There is no weakness noted.  Patient's records were reviewed and he was admitted 1 month ago with similar symptoms.  He had an extensive work-up including MRI and CTAs which did not reveal a cause for his symptoms.  Ultimately neurology felt that his symptoms were secondary to vitamin B12 deficiency.  He received an IM injection before leaving the hospital and it was recommended he take daily supplementation as well as receive monthly injections.  It has been more than a month since his last injection.  Patient was therefore administered an IM injection of vitamin B12.  Basic work-up here has been unremarkable.  He remains nonfocal, no concern for CNS causes for his symptoms.  He will not require repeat admission.  Patient is noted to be hypertensive.  Blood pressure has slowly gone up during his evaluation here in the ER.  He will be due for his morning dose of lisinopril.  Patient ministered labetalol for temporary blood pressure control will be discharged and is to take his morning medications.  Final Clinical Impression(s) / ED Diagnoses Final diagnoses:  Paresthesia  B12 deficiency  Essential hypertension    Rx /  DC Orders ED Discharge Orders    None       Arshia Rondon, Gwenyth Allegra, MD 07/02/19 5456    Orpah Greek, MD 07/02/19 [phone]

## 2019-07-03 NOTE — Progress Notes (Signed)
Attempted to contact patient, unable to leave voicemail.

## 2019-07-04 NOTE — Progress Notes (Signed)
Attempted to contact patient again; unable to leave vm

## 2019-07-05 NOTE — Progress Notes (Signed)
Attempted to contact patient, no response

## 2019-07-12 NOTE — Progress Notes (Signed)
Patient notified of results, no additional questions. 

## 2019-07-30 ENCOUNTER — Ambulatory Visit (INDEPENDENT_AMBULATORY_CARE_PROVIDER_SITE_OTHER): Payer: Managed Care, Other (non HMO) | Admitting: Family Medicine

## 2019-07-30 ENCOUNTER — Other Ambulatory Visit: Payer: Self-pay

## 2019-07-30 DIAGNOSIS — I10 Essential (primary) hypertension: Secondary | ICD-10-CM

## 2019-07-30 NOTE — Progress Notes (Signed)
Pt was called for her telephone visit but; didn't get an anwser or voice mail. Pt was called again @ 11:21. No answer and his phone does not have a voice mail. The message said your call can not be completed at this time.

## 2019-08-30 NOTE — Progress Notes (Deleted)
NEUROLOGY CONSULTATION NOTE  Kenneth Gould MRN: [id number] DOB: 1965/04/20  Referring provider: Alessandra Bevels, MD (hospital referral) Primary care provider: No PCP  Reason for consult:  headache  HISTORY OF PRESENT ILLNESS: Kenneth Gould is a 54 year old ***-handed male with CHF, HTN, prediabetes and history of stroke who presents for headache.  History supplemented by hospital records.    For several months, he reports feeling off-balance.  He has chronic back pain and numbness in the hands and feet for several months.    He was admitted to [facility] on 06/03/2019 for 3 days history of severe headache, described as frontal and left-sided with some facial/jaw pain.  1 day prior to onset, he had episode of left orbital pain with transient left vision loss.  MRI of brain showed old lacunar infarct in the right frontal lobe which was new compared to prior MRI in March.  CTA of head and neck showed no aneurysm or large vessel stenosis or occlusion. Echocardiogram showed EF 50%.   Labs included negative Covid test, LDL 141 and Hgb A1c 6.1. UDS positive for marijuana.  Sed rate was 0 and CRP negative.  Vasculitis panel was otherwise also unremarkable, including ANCA and Sjogren's.  TSH was 0.995.  B1 was 149.8.  B12 level was 97, which was believed to be the cause of his sensory ataxia.  He was ultimately treated for migraine.    PAST MEDICAL HISTORY: Past Medical History:  Diagnosis Date  . Cerebral vascular accident (HCC) 05/2019  . CHF (congestive heart failure) (HCC)   . Essential hypertension 10/24/2014  . Family history of premature CAD   . NICM (nonischemic cardiomyopathy) (HCC)    a. 10/2014 Cath: nl cors, EF 35-45%, glob HK.  . Obesity (BMI 30-39.9)   . Prediabetes 06/2019  . Vitamin B12 deficiency 05/2019  . Vitamin D deficiency 06/2019    PAST SURGICAL HISTORY: Past Surgical History:  Procedure Laterality Date  . CARDIAC CATHETERIZATION N/A 10/25/2014    Procedure: Left Heart Cath and Coronary Angiography;  Surgeon: Lyn Records, MD;  Location: [facility] INVASIVE CV LAB;  Service: Cardiovascular;  Laterality: N/A;  . None      MEDICATIONS: Current Outpatient Medications on File Prior to Visit  Medication Sig Dispense Refill  . albuterol (VENTOLIN HFA) 108 (90 Base) MCG/ACT inhaler Inhale 2 puffs into the lungs every 6 (six) hours as needed for wheezing or shortness of breath.    Marland Kitchen aspirin 81 MG chewable tablet Chew 1 tablet (81 mg total) by mouth daily. 30 tablet 0  . atorvastatin (LIPITOR) 40 MG tablet Take 1 tablet (40 mg total) by mouth daily. 30 tablet 1  . butalbital-acetaminophen-caffeine (FIORICET) 50-325-40 MG tablet Take 1 tablet by mouth every 12 (twelve) hours as needed for headache or migraine. 14 tablet 0  . cyanocobalamin 2000 MCG tablet Take 1 tablet (2,000 mcg total) by mouth daily. 30 tablet 2  . furosemide (LASIX) 20 MG tablet Take 1 tablet (20 mg total) by mouth daily. 30 tablet 0  . hydrochlorothiazide (MICROZIDE) 12.5 MG capsule Take 1 capsule (12.5 mg total) by mouth daily. 90 capsule 3  . lisinopril (ZESTRIL) 20 MG tablet Take 1 tablet (20 mg total) by mouth daily. 90 tablet 3  . polyethylene glycol (MIRALAX / GLYCOLAX) 17 g packet Take 17 g by mouth daily as needed for mild constipation. 14 each 0  . Vitamin D, Ergocalciferol, (DRISDOL) 1.25 MG (50000 UNIT) CAPS capsule Take 1 capsule (50,000 Units  total) by mouth every 7 (seven) days. (X 12 weeks) 4 capsule 2  . [DISCONTINUED] famotidine (PEPCID) 20 MG tablet Take 1 tablet (20 mg total) by mouth 2 (two) times daily. (Patient not taking: Reported on 10/05/2018) 60 tablet 0   Current Facility-Administered Medications on File Prior to Visit  Medication Dose Route Frequency Provider Last Rate Last Admin  . cloNIDine (CATAPRES) tablet 0.3 mg  0.3 mg Oral Once Kallie Locks, FNP        ALLERGIES: Allergies  Allergen Reactions  . Nyquil Multi-Symptom  [Pseudoeph-Doxylamine-Dm-Apap] Nausea And Vomiting    FAMILY HISTORY: Family History  Problem Relation Age of Onset  . Heart attack Sister 18       Died in her sleep, dx at autopsy  . Heart attack Cousin 19       Died in his sleep   ***.  SOCIAL HISTORY: Social History   Socioeconomic History  . Marital status: Married    Spouse name: Not on file  . Number of children: Not on file  . Years of education: Not on file  . Highest education level: Not on file  Occupational History  . Occupation: Truck [employer] doctor  Tobacco Use  . Smoking status: Current Every Day Smoker    Packs/day: 0.50    Years: 30.00    Pack years: 15.00    Types: Cigarettes    Last attempt to quit: 08/25/2015    Years since quitting: 4.0  . Smokeless tobacco: Never Used  Vaping Use  . Vaping Use: Never used  Substance and Sexual Activity  . Alcohol use: No  . Drug use: Yes    Types: Marijuana    Comment: THC  . Sexual activity: Yes  Other Topics Concern  . Not on file  Social History Narrative  . Not on file   Social Determinants of Health   Financial Resource Strain:   . Difficulty of Paying Living Expenses:   Food Insecurity:   . Worried About Programme researcher, broadcasting/film/video in the Last Year:   . Barista in the Last Year:   Transportation Needs:   . Freight forwarder (Medical):   Marland Kitchen Lack of Transportation (Non-Medical):   Physical Activity:   . Days of Exercise per Week:   . Minutes of Exercise per Session:   Stress:   . Feeling of Stress :   Social Connections:   . Frequency of Communication with Friends and Family:   . Frequency of Social Gatherings with Friends and Family:   . Attends Religious Services:   . Active Member of Clubs or Organizations:   . Attends Banker Meetings:   Marland Kitchen Marital Status:   Intimate Partner Violence:   . Fear of Current or Ex-Partner:   . Emotionally Abused:   Marland Kitchen Physically Abused:   . Sexually Abused:     REVIEW OF SYSTEMS: Constitutional:  No fevers, chills, or sweats, no generalized fatigue, change in appetite Eyes: No visual changes, double vision, eye pain Ear, nose and throat: No hearing loss, ear pain, nasal congestion, sore throat Cardiovascular: No chest pain, palpitations Respiratory:  No shortness of breath at rest or with exertion, wheezes GastrointestinaI: No nausea, vomiting, diarrhea, abdominal pain, fecal incontinence Genitourinary:  No dysuria, urinary retention or frequency Musculoskeletal:  No neck pain, back pain Integumentary: No rash, pruritus, skin lesions Neurological: as above Psychiatric: No depression, insomnia, anxiety Endocrine: No palpitations, fatigue, diaphoresis, mood swings, change in appetite, change in weight, increased  thirst Hematologic/Lymphatic:  No purpura, petechiae. Allergic/Immunologic: no itchy/runny eyes, nasal congestion, recent allergic reactions, rashes  PHYSICAL EXAM: *** General: No acute distress.  Patient appears ***-groomed.  *** Head:  Normocephalic/atraumatic Eyes:  fundi examined but not visualized Neck: supple, no paraspinal tenderness, full range of motion Back: No paraspinal tenderness Heart: regular rate and rhythm Lungs: Clear to auscultation bilaterally. Vascular: No carotid bruits. Neurological Exam: Mental status: alert and oriented to person, place, and time, recent and remote memory intact, fund of knowledge intact, attention and concentration intact, speech fluent and not dysarthric, language intact. Cranial nerves: CN I: not tested CN II: pupils equal, round and reactive to light, visual fields intact CN III, IV, VI:  full range of motion, no nystagmus, no ptosis CN V: facial sensation intact CN VII: upper and lower face symmetric CN VIII: hearing intact CN IX, X: gag intact, uvula midline CN XI: sternocleidomastoid and trapezius muscles intact CN XII: tongue midline Bulk & Tone: normal, no fasciculations. Motor:  5/5 throughout *** Sensation:   Pinprick *** temperature *** and vibration sensation intact.  ***. Deep Tendon Reflexes:  2+ throughout, *** toes downgoing.  *** Finger to nose testing:  Without dysmetria.  *** Heel to shin:  Without dysmetria.  *** Gait:  Normal station and stride.  Able to turn and tandem walk. Romberg ***.  IMPRESSION: ***  PLAN: ***  Thank you for allowing me to take part in the care of this patient.  Shon Millet, DO  CC: ***

## 2019-08-31 ENCOUNTER — Ambulatory Visit: Payer: Managed Care, Other (non HMO) | Admitting: Neurology

## 2020-04-25 ENCOUNTER — Other Ambulatory Visit (HOSPITAL_COMMUNITY): Payer: Self-pay

## 2020-04-25 MED ORDER — METRONIDAZOLE 500 MG PO TABS
500.0000 mg | ORAL_TABLET | Freq: Every day | ORAL | 0 refills | Status: DC
  Filled 2020-04-25: qty 4, 1d supply, fill #0

## 2020-06-10 ENCOUNTER — Emergency Department (HOSPITAL_COMMUNITY)
Admission: EM | Admit: 2020-06-10 | Discharge: 2020-06-10 | Disposition: A | Discharge status: Home / Self Care | Payer: No Typology Code available for payment source | Attending: Emergency Medicine | Admitting: Emergency Medicine

## 2020-06-10 ENCOUNTER — Emergency Department (HOSPITAL_COMMUNITY): Payer: No Typology Code available for payment source

## 2020-06-10 DIAGNOSIS — Z79899 Other long term (current) drug therapy: Secondary | ICD-10-CM | POA: Insufficient documentation

## 2020-06-10 DIAGNOSIS — F17210 Nicotine dependence, cigarettes, uncomplicated: Secondary | ICD-10-CM | POA: Diagnosis not present

## 2020-06-10 DIAGNOSIS — I5022 Chronic systolic (congestive) heart failure: Secondary | ICD-10-CM | POA: Insufficient documentation

## 2020-06-10 DIAGNOSIS — Z7982 Long term (current) use of aspirin: Secondary | ICD-10-CM | POA: Insufficient documentation

## 2020-06-10 DIAGNOSIS — I110 Hypertensive heart disease with heart failure: Secondary | ICD-10-CM | POA: Diagnosis not present

## 2020-06-10 DIAGNOSIS — Y92410 Unspecified street and highway as the place of occurrence of the external cause: Secondary | ICD-10-CM | POA: Diagnosis not present

## 2020-06-10 DIAGNOSIS — M25561 Pain in right knee: Secondary | ICD-10-CM | POA: Diagnosis present

## 2020-06-10 MED ORDER — CYCLOBENZAPRINE HCL 10 MG PO TABS
10.0000 mg | ORAL_TABLET | Freq: Three times a day (TID) | ORAL | 0 refills | Status: DC | PRN
Start: 2020-06-10 — End: 2021-02-24

## 2020-06-10 MED ORDER — IBUPROFEN 800 MG PO TABS
800.0000 mg | ORAL_TABLET | Freq: Three times a day (TID) | ORAL | 0 refills | Status: DC
Start: 2020-06-10 — End: 2020-12-17

## 2020-06-10 MED ORDER — OXYCODONE-ACETAMINOPHEN 5-325 MG PO TABS
1.0000 | ORAL_TABLET | Freq: Once | ORAL | Status: AC
  Administered 2020-06-10: 1 via ORAL
  Filled 2020-06-10: qty 1

## 2020-06-10 NOTE — Progress Notes (Signed)
Orthopedic Tech Progress Note Patient Details:  Kenneth Gould Nov 09, 1965 [id number]  Ortho Devices Type of Ortho Device: Crutches,Knee Immobilizer Ortho Device/Splint Location: rle Ortho Device/Splint Interventions: Ordered,Application,Adjustment   Post Interventions Patient Tolerated: Well Instructions Provided: Care of device,Adjustment of device   Trinna Post 06/10/2020, 6:34 AM
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
## 2020-06-10 NOTE — ED Notes (Signed)
Ortho tech called for knee immobilizer and crutches 

## 2020-12-17 ENCOUNTER — Telehealth: Payer: Self-pay

## 2020-12-17 ENCOUNTER — Other Ambulatory Visit: Payer: Self-pay

## 2020-12-17 ENCOUNTER — Ambulatory Visit
Admission: EM | Admit: 2020-12-17 | Discharge: 2020-12-17 | Disposition: A | Discharge status: Home / Self Care | Payer: Self-pay | Attending: Emergency Medicine | Admitting: Emergency Medicine

## 2020-12-17 DIAGNOSIS — K047 Periapical abscess without sinus: Secondary | ICD-10-CM

## 2020-12-17 DIAGNOSIS — S025XXB Fracture of tooth (traumatic), initial encounter for open fracture: Secondary | ICD-10-CM

## 2020-12-17 MED ORDER — PENICILLIN V POTASSIUM 500 MG PO TABS: 500.0000 mg | ORAL_TABLET | Freq: Three times a day (TID) | ORAL | 0 refills | Status: DC

## 2020-12-17 MED ORDER — IBUPROFEN 800 MG PO TABS: 800.0000 mg | ORAL_TABLET | Freq: Three times a day (TID) | ORAL | 0 refills | Status: DC

## 2020-12-17 MED ORDER — PENICILLIN V POTASSIUM 500 MG PO TABS: 500.0000 mg | ORAL_TABLET | Freq: Three times a day (TID) | ORAL | 0 refills | Status: AC

## 2020-12-17 NOTE — Telephone Encounter (Signed)
Pharmacy changes for medications per patients request.

## 2020-12-17 NOTE — ED Triage Notes (Signed)
Pt c/o dental pain to top right of his mouth that started about 3 days ago. He states it feels like the right side of his face is swollen.

## 2020-12-17 NOTE — ED Provider Notes (Signed)
[facility]    CSN: [id number] Arrival date & time: 12/17/20  1222    HISTORY  No chief complaint on file.  HPI Kenneth Gould is a 55 y.o. male. Pt c/o dental pain to top right of his mouth that started about 3 days ago. He states it feels like the right side of his face is swollen.  Patient states he is visiting from out of town, will be in the [place] area for approximately 1 week and then will return home.  Patient states he has already contacted his dentist to set up an appointment for a week from now.  Patient states he is in a lot of pain.  The history is provided by the patient.  Past Medical History:  Diagnosis Date   Cerebral vascular accident ([place]) 05/2019   CHF (congestive heart failure) ([place])    Essential hypertension 10/24/2014   Family history of premature CAD    NICM (nonischemic cardiomyopathy) ([place])    a. 10/2014 Cath: nl cors, EF 35-45%, glob HK.   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   Marijuana abuse 06/06/2019   Headache 06/04/2019   CVA (cerebral vascular accident) ([place]) 06/04/2019   At risk for inadequate pain control 06/04/2019   Congestive heart failure (CHF) ([place]) 10/12/2018   Atypical chest pain 123456   Chronic systolic CHF (congestive heart failure) (HCC)    Chest pain    Unstable angina pectoris ([place]) 10/25/2014   NICM (nonischemic cardiomyopathy) ([place]) 10/25/2014   Chest pain with moderate risk for cardiac etiology 10/24/2014   Essential hypertension 10/24/2014   Chest pain, moderate coronary artery risk 10/24/2014   Past Surgical History:  Procedure Laterality Date   CARDIAC CATHETERIZATION N/A 10/25/2014   Procedure: Left Heart Cath and Coronary Angiography;  Surgeon: Belva Crome, MD;   Location: [place] CV LAB;  Service: Cardiovascular;  Laterality: N/A;   None      Home Medications    Prior to Admission medications   Medication Sig Start Date End Date Taking? Authorizing Provider  ibuprofen (ADVIL) 800 MG tablet Take 1 tablet (800 mg total) by mouth 3 (three) times daily. 12/17/20  Yes Lynden Oxford Scales, PA-C  penicillin v potassium (VEETID) 500 MG tablet Take 1 tablet (500 mg total) by mouth 3 (three) times daily for 14 days. 12/17/20 12/31/20 Yes Lynden Oxford Scales, PA-C  albuterol (VENTOLIN HFA) 108 (90 Base) MCG/ACT inhaler Inhale 2 puffs into the lungs every 6 (six) hours as needed for wheezing or shortness of breath.    [provider]  aspirin 81 MG chewable tablet Chew 1 tablet (81 mg total) by mouth daily. 03/19/19   Hunter, [place], MD  atorvastatin (LIPITOR) 40 MG tablet Take 1 tablet (40 mg total) by mouth daily. 06/06/19   [place]ford Shi, MD  butalbital-acetaminophen-caffeine (FIORICET) 50-325-40 MG tablet Take 1 tablet by mouth every 12 (twelve) hours as needed for headache or migraine. 06/06/19   [place] Shi, MD  cyclobenzaprine (FLEXERIL) 10 MG tablet Take 1 tablet (10 mg total) by mouth 3 (three) times daily as needed for muscle spasms. 06/10/20   Montine Circle, PA-C  furosemide (LASIX) 20 MG tablet Take 1 tablet (20 mg total) by mouth daily. 10/05/18   Joseph Berkshire  J, MD  lisinopril (ZESTRIL) 20 MG tablet Take 1 tablet (20 mg total) by mouth daily. 06/29/19   Kallie Locks, FNP  polyethylene glycol (MIRALAX / GLYCOLAX) 17 g packet Take 17 g by mouth daily as needed for mild constipation. 06/06/19   Alessandra Bevels, MD  famotidine (PEPCID) 20 MG tablet Take 1 tablet (20 mg total) by mouth 2 (two) times daily. Patient not taking: Reported on 10/05/2018 08/26/16 10/05/18  Joseph Art, DO   Family History Family History  Problem Relation Age of Onset   Heart attack Sister 92       Died in her sleep, dx at autopsy    Heart attack Cousin 73       Died in his sleep   Social History Social History   Tobacco Use   Smoking status: Every Day    Packs/day: 0.50    Years: 30.00    Pack years: 15.00    Types: Cigarettes    Last attempt to quit: 08/25/2015    Years since quitting: 5.3   Smokeless tobacco: Never  Vaping Use   Vaping Use: Never used  Substance Use Topics   Alcohol use: No   Drug use: Yes    Types: Marijuana    Comment: THC   Allergies   Nyquil multi-symptom [pseudoeph-doxylamine-dm-apap]  Review of Systems Review of Systems Pertinent findings noted in history of present illness.   Physical Exam Triage Vital Signs ED Triage Vitals  Enc Vitals Group     BP 11/14/20 0827 (!) 147/82     Pulse Rate 11/14/20 0827 72     Resp 11/14/20 0827 18     Temp 11/14/20 0827 98.3 F (36.8 C)     Temp Source 11/14/20 0827 Oral     SpO2 11/14/20 0827 98 %     Weight --      Height --      Head Circumference --      Peak Flow --      Pain Score 11/14/20 0826 5     Pain Loc --      Pain Edu? --      Excl. in GC? --   No data found.  Updated Vital Signs BP (!) 136/93 (BP Location: Left Arm)   Pulse 60   Temp 97.7 F (36.5 C) (Oral)   Resp 20   SpO2 96%   Physical Exam Vitals and nursing note reviewed.  Constitutional:      General: He is not in acute distress.    Appearance: Normal appearance. He is not ill-appearing.  HENT:     Head: Normocephalic and atraumatic.     Salivary Glands: Right salivary gland is not diffusely enlarged or tender. Left salivary gland is not diffusely enlarged or tender.     Right Ear: Tympanic membrane, ear canal and external ear normal. No drainage. No middle ear effusion. There is no impacted cerumen. Tympanic membrane is not erythematous or bulging.     Left Ear: Tympanic membrane, ear canal and external ear normal. No drainage.  No middle ear effusion. There is no impacted cerumen. Tympanic membrane is not erythematous or bulging.     Nose: Nose  normal. No nasal deformity, septal deviation, mucosal edema, congestion or rhinorrhea.     Right Turbinates: Not enlarged, swollen or pale.     Left Turbinates: Not enlarged, swollen or pale.     Right Sinus: No maxillary sinus tenderness or frontal sinus tenderness.  Left Sinus: No maxillary sinus tenderness or frontal sinus tenderness.     Mouth/Throat:     Lips: Pink. No lesions.     Mouth: Mucous membranes are moist. No oral lesions.     Dentition: Abnormal dentition. Dental tenderness, gingival swelling, dental caries and dental abscesses present.     Pharynx: Oropharynx is clear. Uvula midline. No posterior oropharyngeal erythema or uvula swelling.     Tonsils: No tonsillar exudate. 0 on the right. 0 on the left.  Eyes:     General: Lids are normal.        Right eye: No discharge.        Left eye: No discharge.     Extraocular Movements: Extraocular movements intact.     Conjunctiva/sclera: Conjunctivae normal.     Right eye: Right conjunctiva is not injected.     Left eye: Left conjunctiva is not injected.  Neck:     Trachea: Trachea and phonation normal.  Cardiovascular:     Rate and Rhythm: Normal rate and regular rhythm.     Pulses: Normal pulses.     Heart sounds: Normal heart sounds. No murmur heard.   No friction rub. No gallop.  Pulmonary:     Effort: Pulmonary effort is normal. No accessory muscle usage, prolonged expiration or respiratory distress.     Breath sounds: Normal breath sounds. No stridor, decreased air movement or transmitted upper airway sounds. No decreased breath sounds, wheezing, rhonchi or rales.  Chest:     Chest wall: No tenderness.  Musculoskeletal:        General: Normal range of motion.     Cervical back: Normal range of motion and neck supple. Normal range of motion.  Lymphadenopathy:     Cervical: No cervical adenopathy.  Skin:    General: Skin is warm and dry.     Findings: No erythema or rash.  Neurological:     General: No focal  deficit present.     Mental Status: He is alert and oriented to person, place, and time.  Psychiatric:        Mood and Affect: Mood normal.        Behavior: Behavior normal.    Visual Acuity Right Eye Distance:   Left Eye Distance:   Bilateral Distance:    Right Eye Near:   Left Eye Near:    Bilateral Near:     UC Couse / Diagnostics / Procedures:    EKG  Radiology No results found.  Procedures Procedures (including critical care time)  UC Diagnoses / Final Clinical Impressions(s)   I have reviewed the triage vital signs and the nursing notes.  Pertinent labs & imaging results that were available during my care of the patient were reviewed by me and considered in my medical decision making (see chart for details).    Final diagnoses:  Dental abscess  Open fracture of tooth, initial encounter   Dental abscess second to right upper broken tooth.  Penicillin prescribed, ibuprofen prescribed, patient plans to follow-up with his dentist in 1 week.  Disposition Upon Discharge:  Condition: stable for discharge home Home: take medications as prescribed; routine discharge instructions as discussed; follow up as advised.  Patient presented with an acute illness with associated systemic symptoms and significant discomfort requiring urgent management. In my opinion, this is a condition that a prudent lay person (someone who possesses an average knowledge of health and medicine) may potentially expect to result in complications if not addressed urgently such  as respiratory distress, impairment of bodily function or dysfunction of bodily organs.   Routine symptom specific, illness specific and/or disease specific instructions were discussed with the patient and/or caregiver at length.   As such, the patient has been evaluated and assessed, work-up was performed and treatment was provided in alignment with urgent care protocols and evidence based medicine.  Patient/parent/caregiver has  been advised that the patient may require follow up for further testing and treatment if the symptoms continue in spite of treatment, as clinically indicated and appropriate.  If the patient was tested for COVID-19, Influenza and/or RSV, then the patient/parent/guardian was advised to isolate at home pending the results of his/her diagnostic coronavirus test and potentially longer if they're positive. I have also advised pt that if his/her COVID-19 test returns positive, it's recommended to self-isolate for at least 10 days after symptoms first appeared AND until fever-free for 24 hours without fever reducer AND other symptoms have improved or resolved. Discussed self-isolation recommendations as well as instructions for household member/close contacts as per the [facility] and [place] [facility], and also gave patient the [place] packet with this information.  Patient/parent/caregiver has been advised to return to the [facility] or PCP in 3-5 days if no better; to PCP or the Emergency Department if new signs and symptoms develop, or if the current signs or symptoms continue to change or worsen for further workup, evaluation and treatment as clinically indicated and appropriate  The patient will follow up with their current PCP if and as advised. If the patient does not currently have a PCP we will assist them in obtaining one.   The patient may need specialty follow up if the symptoms continue, in spite of conservative treatment and management, for further workup, evaluation, consultation and treatment as clinically indicated and appropriate.   Patient/parent/caregiver verbalized understanding and agreement of plan as discussed.  All questions were addressed during visit.  Please see discharge instructions below for further details of plan.  ED Prescriptions     Medication Sig Dispense Auth. Provider   penicillin v potassium (VEETID) 500 MG tablet Take 1 tablet (500 mg total) by mouth 3 (three) times daily for 14 days. 42  tablet Lynden Oxford Scales, PA-C   ibuprofen (ADVIL) 800 MG tablet Take 1 tablet (800 mg total) by mouth 3 (three) times daily. 21 tablet Lynden Oxford Scales, PA-C      PDMP not reviewed this encounter.  Pending results:  Labs Reviewed - No data to display  Medications Ordered in UC: Medications - No data to display  Discharge Instructions:   Discharge Instructions      I am glad you have already reached out to your dentist to set up an appointment to address your broken tooth.  I prescribed you with penicillin, 1 tablet 3 times daily for the next 14 days.  I have also provided you with a prescription for ibuprofen 800 mg that you can take 3 times daily as needed for pain.  I would anticipate that your pain should resolve in the next 48 to 72 hours but have given you enough ibuprofen to last you for a week.  Thank you for visiting urgent care, I hope you feel better soon.         Lynden Oxford Scales, [place] 12/17/20 [phone]

## 2020-12-17 NOTE — Discharge Instructions (Addendum)
I am glad you have already reached out to your dentist to set up an appointment to address your broken tooth.  I prescribed you with penicillin, 1 tablet 3 times daily for the next 14 days.  I have also provided you with a prescription for ibuprofen 800 mg that you can take 3 times daily as needed for pain.  I would anticipate that your pain should resolve in the next 48 to 72 hours but have given you enough ibuprofen to last you for a week.  Thank you for visiting urgent care, I hope you feel better soon.

## 2021-01-24 ENCOUNTER — Encounter (HOSPITAL_COMMUNITY): Payer: Self-pay | Admitting: Emergency Medicine

## 2021-01-24 ENCOUNTER — Other Ambulatory Visit: Payer: Self-pay

## 2021-01-24 ENCOUNTER — Emergency Department (HOSPITAL_COMMUNITY)
Admission: EM | Admit: 2021-01-24 | Discharge: 2021-01-24 | Disposition: A | Discharge status: Home / Self Care | Payer: Self-pay | Attending: Emergency Medicine | Admitting: Emergency Medicine

## 2021-01-24 ENCOUNTER — Emergency Department (HOSPITAL_COMMUNITY): Payer: Self-pay

## 2021-01-24 DIAGNOSIS — R051 Acute cough: Secondary | ICD-10-CM

## 2021-01-24 DIAGNOSIS — Z79899 Other long term (current) drug therapy: Secondary | ICD-10-CM | POA: Insufficient documentation

## 2021-01-24 DIAGNOSIS — Z20822 Contact with and (suspected) exposure to covid-19: Secondary | ICD-10-CM | POA: Insufficient documentation

## 2021-01-24 DIAGNOSIS — Z7982 Long term (current) use of aspirin: Secondary | ICD-10-CM | POA: Insufficient documentation

## 2021-01-24 DIAGNOSIS — B349 Viral infection, unspecified: Secondary | ICD-10-CM | POA: Insufficient documentation

## 2021-01-24 LAB — CBC WITH DIFFERENTIAL/PLATELET
Abs Immature Granulocytes: 0.01 10*3/uL (ref 0.00–0.07)
Basophils Absolute: 0 10*3/uL (ref 0.0–0.1)
Basophils Relative: 1 %
Eosinophils Absolute: 0.1 10*3/uL (ref 0.0–0.5)
Eosinophils Relative: 2 %
HCT: 54.2 % — ABNORMAL HIGH (ref 39.0–52.0)
Hemoglobin: 17.5 g/dL — ABNORMAL HIGH (ref 13.0–17.0)
Immature Granulocytes: 0 %
Lymphocytes Relative: 38 %
Lymphs Abs: 2.5 10*3/uL (ref 0.7–4.0)
MCH: 28 pg (ref 26.0–34.0)
MCHC: 32.3 g/dL (ref 30.0–36.0)
MCV: 86.9 fL (ref 80.0–100.0)
Monocytes Absolute: 0.5 10*3/uL (ref 0.1–1.0)
Monocytes Relative: 7 %
Neutro Abs: 3.4 10*3/uL (ref 1.7–7.7)
Neutrophils Relative %: 52 %
Platelets: 185 10*3/uL (ref 150–400)
RBC: 6.24 MIL/uL — ABNORMAL HIGH (ref 4.22–5.81)
RDW: 14.3 % (ref 11.5–15.5)
WBC: 6.5 10*3/uL (ref 4.0–10.5)
nRBC: 0 % (ref 0.0–0.2)

## 2021-01-24 LAB — RESP PANEL BY RT-PCR (FLU A&B, COVID) ARPGX2
Influenza A by PCR: NEGATIVE
Influenza B by PCR: NEGATIVE
SARS Coronavirus 2 by RT PCR: NEGATIVE

## 2021-01-24 LAB — COMPREHENSIVE METABOLIC PANEL
ALT: 19 U/L (ref 0–44)
AST: 23 U/L (ref 15–41)
Albumin: 4 g/dL (ref 3.5–5.0)
Alkaline Phosphatase: 82 U/L (ref 38–126)
Anion gap: 11 (ref 5–15)
BUN: 9 mg/dL (ref 6–20)
CO2: 30 mmol/L (ref 22–32)
Calcium: 9.2 mg/dL (ref 8.9–10.3)
Chloride: 99 mmol/L (ref 98–111)
Creatinine, Ser: 1.25 mg/dL — ABNORMAL HIGH (ref 0.61–1.24)
GFR, Estimated: >60 mL/min (ref >60)
Glucose, Bld: 127 mg/dL — ABNORMAL HIGH (ref 70–99)
Potassium: 3.7 mmol/L (ref 3.5–5.1)
Sodium: 140 mmol/L (ref 135–145)
Total Bilirubin: 1.2 mg/dL (ref 0.3–1.2)
Total Protein: 7.8 g/dL (ref 6.5–8.1)

## 2021-01-24 MED ORDER — ONDANSETRON 4 MG PO TBDP
8.0000 mg | ORAL_TABLET | Freq: Once | ORAL | Status: AC
  Administered 2021-01-24: 8 mg via ORAL
  Filled 2021-01-24: qty 2

## 2021-01-24 MED ORDER — BENZONATATE 100 MG PO CAPS: 100.0000 mg | ORAL_CAPSULE | Freq: Three times a day (TID) | ORAL | 0 refills | Status: DC

## 2021-01-24 MED ORDER — ACETAMINOPHEN-CODEINE #3 300-30 MG PO TABS
1.0000 | ORAL_TABLET | Freq: Once | ORAL | Status: AC
  Administered 2021-01-24: 1 via ORAL
  Filled 2021-01-24: qty 1

## 2021-01-24 MED ORDER — ALBUTEROL SULFATE HFA 108 (90 BASE) MCG/ACT IN AERS: 2.0000 | INHALATION_SPRAY | RESPIRATORY_TRACT | 0 refills | Status: DC | PRN

## 2021-01-24 MED ORDER — IPRATROPIUM-ALBUTEROL 0.5-2.5 (3) MG/3ML IN SOLN
3.0000 mL | Freq: Once | RESPIRATORY_TRACT | Status: AC
  Administered 2021-01-24: 3 mL via RESPIRATORY_TRACT
  Filled 2021-01-24: qty 3

## 2021-01-24 NOTE — ED Triage Notes (Signed)
Pt endorses being sick for 2 weeks, including cough, N/V. States he has unable to keep anything down. Hx of CHF.

## 2021-01-24 NOTE — Discharge Instructions (Addendum)
X-rays, COVID-19 and flu test are negative. Use the inhaler every 4 - 6 hours for the next 2 or 3 days.  See your primary care doctor in 1 week.

## 2021-01-24 NOTE — ED Notes (Signed)
Pt and stretcher not in room upon assessment attempt. Will check back in a few minutes

## 2021-01-24 NOTE — ED Provider Triage Note (Signed)
Emergency Medicine Provider Triage Evaluation Note  Kenneth Gould , a 56 y.o. male  was evaluated in triage.  Pt complains of cough, congestion, nausea, vomiting, diarrhea.  States that his symptoms have been ongoing for the last 2 weeks with significant amounts of nausea and vomiting and some diarrhea as well.  He states he has not been able to keep anything down.  He endorses generalized body aches as well.  At home COVID testing negative.  Denies fevers or chills or shortness of breath  Review of Systems  Positive:  Negative: See above  Physical Exam  BP (!) 146/102    Pulse 76    Temp 98.5 F (36.9 C)    Resp 18    Ht 5\' 8"  (1.727 m)    SpO2 96%    BMI 45.61 kg/m  Gen:   Awake, no distress   Resp:  Normal effort  MSK:   Moves extremities without difficulty  Other:   Medical Decision Making  Medically screening exam initiated at 11:38 AM.  Appropriate orders placed.  Kenneth Gould was informed that the remainder of the evaluation will be completed by another provider, this initial triage assessment does not replace that evaluation, and the importance of remaining in the ED until their evaluation is complete.     Chong Sicilian, PA-C 01/24/21 1141

## 2021-01-24 NOTE — ED Provider Notes (Addendum)
[facility] EMERGENCY DEPARTMENT Provider Note   CSN: [id number] Arrival date & time: 01/24/21  1118     History  Chief Complaint  Patient presents with   Generalized Body Aches   Emesis    Kenneth Gould is a 56 y.o. male.  HPI     56 year old comes in with chief complaint of body aches and vomiting.  Patient has history of CHF, strokes and reports that for the last 2 weeks has been having cough, body aches, URI-like symptoms and malaise.  He is also having nausea and vomiting.  He has not been able to tolerate food or water intake.  Home COVID test was negative.  Denies any other sick contacts.  Cough is mostly dry.  No UTI-like symptoms or headaches.  Home Medications Prior to Admission medications   Medication Sig Start Date End Date Taking? Authorizing Provider  albuterol (VENTOLIN HFA) 108 (90 Base) MCG/ACT inhaler Inhale 2 puffs into the lungs every 4 (four) hours as needed for wheezing or shortness of breath. 01/24/21  Yes Derwood Kaplan, MD  benzonatate (TESSALON) 100 MG capsule Take 1 capsule (100 mg total) by mouth every 8 (eight) hours. 01/24/21  Yes Derwood Kaplan, MD  aspirin 81 MG chewable tablet Chew 1 tablet (81 mg total) by mouth daily. 03/19/19   Hunter, [place], MD  atorvastatin (LIPITOR) 40 MG tablet Take 1 tablet (40 mg total) by mouth daily. 06/06/19   Alessandra Bevels, MD  butalbital-acetaminophen-caffeine (FIORICET) 50-325-40 MG tablet Take 1 tablet by mouth every 12 (twelve) hours as needed for headache or migraine. 06/06/19   Alessandra Bevels, MD  cyclobenzaprine (FLEXERIL) 10 MG tablet Take 1 tablet (10 mg total) by mouth 3 (three) times daily as needed for muscle spasms. 06/10/20   Roxy Horseman, PA-C  furosemide (LASIX) 20 MG tablet Take 1 tablet (20 mg total) by mouth daily. 10/05/18   Gilda Crease, MD  ibuprofen (ADVIL) 800 MG tablet Take 1 tablet (800 mg total) by mouth 3 (three) times daily. 12/17/20   Theadora Rama  Scales, PA-C  lisinopril (ZESTRIL) 20 MG tablet Take 1 tablet (20 mg total) by mouth daily. 06/29/19   Kallie Locks, FNP  polyethylene glycol (MIRALAX / GLYCOLAX) 17 g packet Take 17 g by mouth daily as needed for mild constipation. 06/06/19   Alessandra Bevels, MD  famotidine (PEPCID) 20 MG tablet Take 1 tablet (20 mg total) by mouth 2 (two) times daily. Patient not taking: Reported on 10/05/2018 08/26/16 10/05/18  Joseph Art, DO      Allergies    Nyquil multi-symptom [pseudoeph-doxylamine-dm-apap]    Review of Systems   Review of Systems  Constitutional:  Positive for activity change.  Respiratory:  Positive for cough and shortness of breath.   Cardiovascular:  Negative for chest pain.  Gastrointestinal:  Positive for nausea and vomiting.  Genitourinary:  Negative for dysuria.   Physical Exam Updated Vital Signs BP (!) 124/98    Pulse 61    Temp 98.5 F (36.9 C)    Resp (!) 29    Ht 5\' 8"  (1.727 m)    SpO2 97%    BMI 45.61 kg/m  Physical Exam Vitals and nursing note reviewed.  Constitutional:      Appearance: He is well-developed.  HENT:     Head: Atraumatic.  Cardiovascular:     Rate and Rhythm: Normal rate.  Pulmonary:     Effort: Pulmonary effort is normal.     Breath sounds: No  wheezing, rhonchi or rales.  Musculoskeletal:     Cervical back: Neck supple.     Right lower leg: No edema.     Left lower leg: No edema.  Skin:    General: Skin is warm.  Neurological:     Mental Status: He is alert and oriented to person, place, and time.    ED Results / Procedures / Treatments   Labs (all labs ordered are listed, but only abnormal results are displayed) Labs Reviewed  CBC WITH DIFFERENTIAL/PLATELET - Abnormal; Notable for the following components:      Result Value   RBC 6.24 (*)    Hemoglobin 17.5 (*)    HCT 54.2 (*)    All other components within normal limits  COMPREHENSIVE METABOLIC PANEL - Abnormal; Notable for the following components:   Glucose, Bld  127 (*)    Creatinine, Ser 1.25 (*)    All other components within normal limits  RESP PANEL BY RT-PCR (FLU A&B, COVID) [id number]    EKG None  Radiology DG Chest 2 View  Result Date: 01/24/2021 CLINICAL DATA:  Cough. EXAM: CHEST - 2 VIEW COMPARISON:  March 19, 2019 FINDINGS: Stable mild cardiomegaly. No pneumothorax. Haziness over the bases without definitive focal infiltrate. No pneumothorax. No other acute abnormalities identified. IMPRESSION: Haziness over the bases may be due to overlapping soft tissues. No definitive focal infiltrate identified. No other acute abnormalities. Electronically Signed   By: Gerome Sam III M.D.   On: 01/24/2021 12:39    Procedures Procedures    Medications Ordered in ED Medications  ipratropium-albuterol (DUONEB) 0.5-2.5 (3) MG/3ML nebulizer solution 3 mL (3 mLs Nebulization Given 01/24/21 1423)  acetaminophen-codeine (TYLENOL #3) 300-30 MG per tablet 1 tablet (1 tablet Oral Given 01/24/21 1422)  ondansetron (ZOFRAN-ODT) disintegrating tablet 8 mg (8 mg Oral Given 01/24/21 1422)    ED Course/ Medical Decision Making/ A&P Clinical Course as of 01/24/21 1502  Sat Jan 24, 2021  1502 Patient reassessed after the breathing treatment.  He feels better. Discussed with him that the COVID and flu test is negative.  Chest x-ray, which was independently reviewed is also appears reassuring. [AN]    Clinical Course User Index [AN] Derwood Kaplan, MD                           Medical Decision Making 56 year old male with history of CHF, stroke comes in with chief complaint of URI-like symptoms with associated cough.  Symptoms have been present for 2 days.  He is also having nausea and vomiting.   Differential diagnosis includes COVID-19, flu, superimposed bacterial pneumonia, postviral syndrome, small bowel obstruction.  Abdominal exam is reassuring.  There is no peritonitis type finding. No abdominal surgical history.  Given that patient does not have any  peritoneal finding and there is no abdominal surgical history, I do not think a CT scan of the abdomen pelvis is indicated.  Given that he denies any chest pain, in the short notes of breath is posttussive, do not think this is ACS related.  I reviewed patient's catheterizations from 2016 which was nonocclusive.  I do not think cardiac work-up is indicated.  Currently the plan is to manage his symptoms little better and initiate oral challenge.  Suspicion is high for active viral syndrome or postviral syndrome.  We had ordered CBC and metabolic profile which are reassuring.  Amount and/or Complexity of Data Reviewed External Data Reviewed: radiology and notes. Labs: ordered.  Decision-making details documented in ED Course. Radiology: ordered and independent interpretation performed.    Details: X-ray reviewed independently, no evidence of pneumonia or pneumothorax ECG/medicine tests: ordered and independent interpretation performed.   Reassessment 2 pm: Patient still awaiting his DuoNeb and Tylenol with codeine.   Final Clinical Impression(s) / ED Diagnoses Final diagnoses:  Viral illness  Acute cough    Rx / DC Orders ED Discharge Orders          Ordered    benzonatate (TESSALON) 100 MG capsule  Every 8 hours        01/24/21 1501    albuterol (VENTOLIN HFA) 108 (90 Base) MCG/ACT inhaler  Every 4 hours PRN        01/24/21 1501              Derwood Kaplan, MD 01/24/21 1409    Derwood Kaplan, MD 01/24/21 1502

## 2021-01-24 NOTE — ED Notes (Signed)
Pt tolerated PO challenge

## 2021-02-23 ENCOUNTER — Emergency Department (HOSPITAL_COMMUNITY): Payer: Self-pay

## 2021-02-23 ENCOUNTER — Encounter (HOSPITAL_COMMUNITY): Payer: Self-pay

## 2021-02-23 ENCOUNTER — Observation Stay (HOSPITAL_COMMUNITY)
Admission: EM | Admit: 2021-02-23 | Discharge: 2021-02-24 | Disposition: A | Discharge status: Home / Self Care | Payer: Self-pay | Attending: Internal Medicine | Admitting: Internal Medicine

## 2021-02-23 DIAGNOSIS — I428 Other cardiomyopathies: Secondary | ICD-10-CM

## 2021-02-23 DIAGNOSIS — R0789 Other chest pain: Principal | ICD-10-CM | POA: Insufficient documentation

## 2021-02-23 DIAGNOSIS — Z8673 Personal history of transient ischemic attack (TIA), and cerebral infarction without residual deficits: Secondary | ICD-10-CM | POA: Insufficient documentation

## 2021-02-23 DIAGNOSIS — I110 Hypertensive heart disease with heart failure: Secondary | ICD-10-CM | POA: Insufficient documentation

## 2021-02-23 DIAGNOSIS — I10 Essential (primary) hypertension: Secondary | ICD-10-CM

## 2021-02-23 DIAGNOSIS — I5022 Chronic systolic (congestive) heart failure: Secondary | ICD-10-CM | POA: Insufficient documentation

## 2021-02-23 DIAGNOSIS — F17210 Nicotine dependence, cigarettes, uncomplicated: Secondary | ICD-10-CM | POA: Insufficient documentation

## 2021-02-23 DIAGNOSIS — Z7982 Long term (current) use of aspirin: Secondary | ICD-10-CM | POA: Insufficient documentation

## 2021-02-23 DIAGNOSIS — Z91199 Patient's noncompliance with other medical treatment and regimen due to unspecified reason: Secondary | ICD-10-CM

## 2021-02-23 DIAGNOSIS — R0600 Dyspnea, unspecified: Secondary | ICD-10-CM

## 2021-02-23 DIAGNOSIS — R079 Chest pain, unspecified: Secondary | ICD-10-CM

## 2021-02-23 DIAGNOSIS — R0602 Shortness of breath: Secondary | ICD-10-CM | POA: Insufficient documentation

## 2021-02-23 DIAGNOSIS — Z79899 Other long term (current) drug therapy: Secondary | ICD-10-CM | POA: Insufficient documentation

## 2021-02-23 DIAGNOSIS — R7303 Prediabetes: Secondary | ICD-10-CM | POA: Insufficient documentation

## 2021-02-23 DIAGNOSIS — F1210 Cannabis abuse, uncomplicated: Secondary | ICD-10-CM | POA: Diagnosis present

## 2021-02-23 DIAGNOSIS — Z20822 Contact with and (suspected) exposure to covid-19: Secondary | ICD-10-CM | POA: Insufficient documentation

## 2021-02-23 LAB — TROPONIN I (HIGH SENSITIVITY)
Troponin I (High Sensitivity): 7 ng/L (ref <18)
Troponin I (High Sensitivity): 9 ng/L (ref <18)

## 2021-02-23 LAB — CBC
HCT: 51.4 % (ref 39.0–52.0)
Hemoglobin: 16.5 g/dL (ref 13.0–17.0)
MCH: 27.7 pg (ref 26.0–34.0)
MCHC: 32.1 g/dL (ref 30.0–36.0)
MCV: 86.2 fL (ref 80.0–100.0)
Platelets: 198 10*3/uL (ref 150–400)
RBC: 5.96 MIL/uL — ABNORMAL HIGH (ref 4.22–5.81)
RDW: 14.6 % (ref 11.5–15.5)
WBC: 7.8 10*3/uL (ref 4.0–10.5)
nRBC: 0 % (ref 0.0–0.2)

## 2021-02-23 LAB — BASIC METABOLIC PANEL
Anion gap: 8 (ref 5–15)
BUN: 5 mg/dL — ABNORMAL LOW (ref 6–20)
CO2: 28 mmol/L (ref 22–32)
Calcium: 9.1 mg/dL (ref 8.9–10.3)
Chloride: 104 mmol/L (ref 98–111)
Creatinine, Ser: 1.08 mg/dL (ref 0.61–1.24)
GFR, Estimated: >60 mL/min (ref >60)
Glucose, Bld: 100 mg/dL — ABNORMAL HIGH (ref 70–99)
Potassium: 4.1 mmol/L (ref 3.5–5.1)
Sodium: 140 mmol/L (ref 135–145)

## 2021-02-23 LAB — BRAIN NATRIURETIC PEPTIDE: B Natriuretic Peptide: 64.3 pg/mL (ref 0.0–100.0)

## 2021-02-23 NOTE — ED Triage Notes (Signed)
Pt arrives POV for eval of CP and SOB. Reports onset this AM, states worsening SOB. Denies alleviating or exacerbating factors.

## 2021-02-23 NOTE — ED Provider Notes (Signed)
[facility] EMERGENCY DEPARTMENT Provider Note   CSN: [id number] Arrival date & time: 02/23/21  1650     History  Chief Complaint  Patient presents with   Chest Pain    Kenneth Gould is a 56 y.o. male.  Patient with history of congestive heart failure, has had care in [place], no current cardiologist locally, has been out of Lasix for the past month with insurance difficulties and has not taken his medications regularly presents with worsening left-sided chest tightness and shortness of breath.  Patient's had this before but this is more severe.  Patient did have diaphoresis with it.  No fevers chills or productive cough.  Patient had viral respiratory symptoms few weeks back.  No syncope.  Patient denies any pulm embolism history, no recent surgeries, no leg edema no active cancer.      Home Medications Prior to Admission medications   Medication Sig Start Date End Date Taking? Authorizing Provider  albuterol (VENTOLIN HFA) 108 (90 Base) MCG/ACT inhaler Inhale 2 puffs into the lungs every 4 (four) hours as needed for wheezing or shortness of breath. 01/24/21  Yes Derwood Kaplan, MD  aspirin 81 MG chewable tablet Chew 1 tablet (81 mg total) by mouth daily. 03/19/19  Yes Hunter, [place], MD  atorvastatin (LIPITOR) 40 MG tablet Take 1 tablet (40 mg total) by mouth daily. 06/06/19  Yes Alessandra Bevels, MD  furosemide (LASIX) 20 MG tablet Take 1 tablet (20 mg total) by mouth daily. 10/05/18  Yes Pollina, Canary Brim, MD  lisinopril (ZESTRIL) 20 MG tablet Take 1 tablet (20 mg total) by mouth daily. 06/29/19  Yes Kallie Locks, FNP  benzonatate (TESSALON) 100 MG capsule Take 1 capsule (100 mg total) by mouth every 8 (eight) hours. Patient not taking: Reported on 02/23/2021 01/24/21   Derwood Kaplan, MD  butalbital-acetaminophen-caffeine (FIORICET) 50-325-40 MG tablet Take 1 tablet by mouth every 12 (twelve) hours as needed for headache or migraine. Patient not  taking: Reported on 02/23/2021 06/06/19   Alessandra Bevels, MD  cyclobenzaprine (FLEXERIL) 10 MG tablet Take 1 tablet (10 mg total) by mouth 3 (three) times daily as needed for muscle spasms. Patient not taking: Reported on 02/23/2021 06/10/20   Roxy Horseman, PA-C  ibuprofen (ADVIL) 800 MG tablet Take 1 tablet (800 mg total) by mouth 3 (three) times daily. Patient not taking: Reported on 02/23/2021 12/17/20   Theadora Rama Scales, PA-C  polyethylene glycol (MIRALAX / GLYCOLAX) 17 g packet Take 17 g by mouth daily as needed for mild constipation. Patient not taking: Reported on 02/23/2021 06/06/19   Alessandra Bevels, MD  famotidine (PEPCID) 20 MG tablet Take 1 tablet (20 mg total) by mouth 2 (two) times daily. Patient not taking: Reported on 10/05/2018 08/26/16 10/05/18  Joseph Art, DO      Allergies    Nyquil multi-symptom [pseudoeph-doxylamine-dm-apap]    Review of Systems   Review of Systems  Constitutional:  Negative for chills and fever.  HENT:  Negative for congestion.   Eyes:  Negative for visual disturbance.  Respiratory:  Positive for shortness of breath.   Cardiovascular:  Positive for chest pain. Negative for leg swelling.  Gastrointestinal:  Negative for abdominal pain and vomiting.  Genitourinary:  Negative for dysuria and flank pain.  Musculoskeletal:  Negative for back pain, neck pain and neck stiffness.  Skin:  Negative for rash.  Neurological:  Negative for light-headedness and headaches.   Physical Exam Updated Vital Signs BP (!) 135/117 (BP Location: Right  Arm)    Pulse 69    Temp 98.3 F (36.8 C) (Oral)    Resp (!) 22    Ht 5\' 8"  (1.727 m)    Wt 136 kg    SpO2 96%    BMI 45.59 kg/m  Physical Exam Vitals and nursing note reviewed.  Constitutional:      General: He is not in acute distress.    Appearance: He is well-developed.  HENT:     Head: Normocephalic and atraumatic.     Mouth/Throat:     Mouth: Mucous membranes are moist.  Eyes:     General:         Right eye: No discharge.        Left eye: No discharge.     Conjunctiva/sclera: Conjunctivae normal.  Neck:     Trachea: No tracheal deviation.  Cardiovascular:     Rate and Rhythm: Normal rate and regular rhythm.     Heart sounds: No murmur heard. Pulmonary:     Effort: Pulmonary effort is normal.     Breath sounds: Rales (sparse bases) present.  Abdominal:     General: There is no distension.     Palpations: Abdomen is soft.     Tenderness: There is no abdominal tenderness. There is no guarding.  Musculoskeletal:     Cervical back: Normal range of motion and neck supple. No rigidity.     Right lower leg: No edema.     Left lower leg: No edema.  Skin:    General: Skin is warm.     Capillary Refill: Capillary refill takes less than 2 seconds.     Findings: No rash.  Neurological:     General: No focal deficit present.     Mental Status: He is alert.     Cranial Nerves: No cranial nerve deficit.  Psychiatric:        Mood and Affect: Mood normal.    ED Results / Procedures / Treatments   Labs (all labs ordered are listed, but only abnormal results are displayed) Labs Reviewed  BASIC METABOLIC PANEL - Abnormal; Notable for the following components:      Result Value   Glucose, Bld 100 (*)    BUN 5 (*)    All other components within normal limits  CBC - Abnormal; Notable for the following components:   RBC 5.96 (*)    All other components within normal limits  BRAIN NATRIURETIC PEPTIDE  TROPONIN I (HIGH SENSITIVITY)  TROPONIN I (HIGH SENSITIVITY)    EKG EKG Interpretation  Date/Time:  Monday February 23 2021 17:13:56 EST Ventricular Rate:  74 PR Interval:  144 QRS Duration: 98 QT Interval:  404 QTC Calculation: 448 R Axis:   -66 Text Interpretation: Normal sinus rhythm Left axis deviation Septal infarct , age undetermined Abnormal ECG When compared with ECG of 02-Jul-2019 04:06, PREVIOUS ECG IS PRESENT Confirmed by Blane Ohara [phone]) on 02/23/2021 8:41:13  PM  Radiology DG Chest 2 View  Result Date: 02/23/2021 CLINICAL DATA:  Chest pain EXAM: CHEST - 2 VIEW COMPARISON:  01/24/2021 FINDINGS: Stable cardiomegaly. Pulmonary vasculature within normal limits. Minimally increased interstitial markings bilaterally. No lobar consolidation. No pleural effusion or pneumothorax. IMPRESSION: Stable cardiomegaly. Minimally increased interstitial markings bilaterally may reflect bronchitic type lung changes versus mild interstitial edema. Electronically Signed   By: Duanne Guess D.O.   On: 02/23/2021 18:54    Procedures Procedures    Medications Ordered in ED Medications - No data to display  ED Course/ Medical Decision Making/ A&P                           Medical Decision Making  Patient with known cardiac history presents with worsening chest pain shortness of breath differential including ACS, acute heart failure exacerbation, pulmonary embolism, pneumonia, pleural effusions, other.  Given history and noncompliance with medications recently concern for heart failure exacerbation, troponin sent to look for signs of NSTEMI.  Medical records reviewed and patient had heart cath 2016 which was widely patent coronary arteries and ejection fraction 35 to 45%.  Blood work results reviewed and negative troponin, electrolytes reviewed unremarkable, no significant anemia, BNP normal.  Chest x-ray reviewed mild edema.  Discussed with cardiologist fellow on-call for consult discussed observation/admission or further testing if needed.        Final Clinical Impression(s) / ED Diagnoses Final diagnoses:  Acute chest pain  Acute dyspnea    Rx / DC Orders ED Discharge Orders     None         Blane Ohara, MD 02/23/21 2339

## 2021-02-23 NOTE — ED Provider Triage Note (Signed)
Emergency Medicine Provider Triage Evaluation Note  Kenneth Gould , a 56 y.o. male  was evaluated in triage.  Pt complains of cp and shob.  Pain was present when he woke up.  The left side of his chest and his left arm hurt.    Hasn't changed since starting.  He is frequently yawning in the exam room.  He has been out of some of his meds due to insurance issues.   He hasn't had lasix in a month  Lisinopril last had a few days ago.   He hasn't had many of them regularly      Physical Exam  BP (!) 152/98 (BP Location: Right Arm)    Pulse 79    Temp 98.1 F (36.7 C) (Oral)    Resp 20    Ht 5\' 8"  (1.727 m)    Wt 136 kg    SpO2 96%    BMI 45.59 kg/m  Gen:   Awake, no distress   Resp:  Normal effort  MSK:   Moves extremities without difficulty  Other:  2+ radial pulses bilaterally.   Medical Decision Making  Medically screening exam initiated at 7:12 PM.  Appropriate orders placed.  Kenneth Gould was informed that the remainder of the evaluation will be completed by another provider, this initial triage assessment does not replace that evaluation, and the importance of remaining in the ED until their evaluation is complete.     Chong Sicilian, Cristina Gong 02/23/21 1916

## 2021-02-24 ENCOUNTER — Observation Stay (HOSPITAL_COMMUNITY): Payer: Self-pay

## 2021-02-24 ENCOUNTER — Other Ambulatory Visit: Payer: Self-pay

## 2021-02-24 DIAGNOSIS — I5022 Chronic systolic (congestive) heart failure: Secondary | ICD-10-CM

## 2021-02-24 DIAGNOSIS — Z91199 Patient's noncompliance with other medical treatment and regimen due to unspecified reason: Secondary | ICD-10-CM

## 2021-02-24 DIAGNOSIS — R0789 Other chest pain: Secondary | ICD-10-CM

## 2021-02-24 DIAGNOSIS — R079 Chest pain, unspecified: Secondary | ICD-10-CM

## 2021-02-24 DIAGNOSIS — I428 Other cardiomyopathies: Secondary | ICD-10-CM

## 2021-02-24 DIAGNOSIS — F1210 Cannabis abuse, uncomplicated: Secondary | ICD-10-CM

## 2021-02-24 DIAGNOSIS — I10 Essential (primary) hypertension: Secondary | ICD-10-CM

## 2021-02-24 LAB — LIPID PANEL
Cholesterol: 231 mg/dL — ABNORMAL HIGH (ref 0–200)
HDL: 53 mg/dL (ref >40)
LDL Cholesterol: 166 mg/dL — ABNORMAL HIGH (ref 0–99)
Total CHOL/HDL Ratio: 4.4 RATIO
Triglycerides: 59 mg/dL (ref <150)
VLDL: 12 mg/dL (ref 0–40)

## 2021-02-24 LAB — RESP PANEL BY RT-PCR (FLU A&B, COVID) ARPGX2
Influenza A by PCR: NEGATIVE
Influenza B by PCR: NEGATIVE
SARS Coronavirus 2 by RT PCR: NEGATIVE

## 2021-02-24 LAB — RAPID URINE DRUG SCREEN, HOSP PERFORMED
Amphetamines: NOT DETECTED
Barbiturates: NOT DETECTED
Benzodiazepines: NOT DETECTED
Cocaine: POSITIVE — AB
Opiates: NOT DETECTED
Tetrahydrocannabinol: POSITIVE — AB

## 2021-02-24 LAB — SEDIMENTATION RATE: Sed Rate: 1 mm/hr (ref 0–16)

## 2021-02-24 MED ORDER — ATORVASTATIN CALCIUM 40 MG PO TABS: 40.0000 mg | ORAL_TABLET | Freq: Every day | ORAL | 0 refills | Status: AC

## 2021-02-24 MED ORDER — ACETAMINOPHEN 325 MG PO TABS: 650.0000 mg | ORAL_TABLET | Freq: Four times a day (QID) | ORAL | Status: DC | PRN

## 2021-02-24 MED ORDER — ALBUTEROL SULFATE (2.5 MG/3ML) 0.083% IN NEBU: 2.5000 mg | INHALATION_SOLUTION | RESPIRATORY_TRACT | Status: DC | PRN

## 2021-02-24 MED ORDER — ATORVASTATIN CALCIUM 40 MG PO TABS
40.0000 mg | ORAL_TABLET | Freq: Every day | ORAL | Status: DC
  Administered 2021-02-24: 40 mg via ORAL
  Filled 2021-02-24: qty 1

## 2021-02-24 MED ORDER — ASPIRIN 325 MG PO TABS
325.0000 mg | ORAL_TABLET | Freq: Once | ORAL | Status: AC
  Administered 2021-02-24: 325 mg via ORAL
  Filled 2021-02-24: qty 1

## 2021-02-24 MED ORDER — ALBUTEROL SULFATE HFA 108 (90 BASE) MCG/ACT IN AERS: 2.0000 | INHALATION_SPRAY | RESPIRATORY_TRACT | 0 refills | Status: AC | PRN

## 2021-02-24 MED ORDER — NITROGLYCERIN 0.4 MG SL SUBL
SUBLINGUAL_TABLET | SUBLINGUAL | Status: AC
  Filled 2021-02-24: qty 2

## 2021-02-24 MED ORDER — ONDANSETRON HCL 4 MG/2ML IJ SOLN: 4.0000 mg | Freq: Four times a day (QID) | INTRAMUSCULAR | Status: DC | PRN

## 2021-02-24 MED ORDER — METOPROLOL TARTRATE 25 MG PO TABS
50.0000 mg | ORAL_TABLET | Freq: Once | ORAL | Status: AC
  Administered 2021-02-24: 50 mg via ORAL
  Filled 2021-02-24: qty 2

## 2021-02-24 MED ORDER — ASPIRIN 81 MG PO CHEW
81.0000 mg | CHEWABLE_TABLET | Freq: Every day | ORAL | Status: DC
  Administered 2021-02-24: 81 mg via ORAL
  Filled 2021-02-24: qty 1

## 2021-02-24 MED ORDER — ACETAMINOPHEN 650 MG RE SUPP: 650.0000 mg | Freq: Four times a day (QID) | RECTAL | Status: DC | PRN

## 2021-02-24 MED ORDER — LISINOPRIL 20 MG PO TABS: 20.0000 mg | ORAL_TABLET | Freq: Every day | ORAL | 0 refills | Status: AC

## 2021-02-24 MED ORDER — FUROSEMIDE 20 MG PO TABS: 20.0000 mg | ORAL_TABLET | Freq: Every day | ORAL | 0 refills | Status: AC

## 2021-02-24 MED ORDER — NICOTINE 21 MG/24HR TD PT24
21.0000 mg | MEDICATED_PATCH | Freq: Every day | TRANSDERMAL | Status: DC
  Administered 2021-02-24: 21 mg via TRANSDERMAL
  Filled 2021-02-24: qty 1

## 2021-02-24 MED ORDER — FUROSEMIDE 10 MG/ML IJ SOLN: 20.0000 mg | Freq: Once | INTRAMUSCULAR | Status: DC

## 2021-02-24 MED ORDER — AMLODIPINE BESYLATE 5 MG PO TABS
10.0000 mg | ORAL_TABLET | Freq: Every day | ORAL | Status: DC
  Administered 2021-02-24: 10 mg via ORAL
  Filled 2021-02-24: qty 2

## 2021-02-24 MED ORDER — ONDANSETRON HCL 4 MG PO TABS: 4.0000 mg | ORAL_TABLET | Freq: Four times a day (QID) | ORAL | Status: DC | PRN

## 2021-02-24 MED ORDER — ENOXAPARIN SODIUM 80 MG/0.8ML IJ SOSY
70.0000 mg | PREFILLED_SYRINGE | INTRAMUSCULAR | Status: DC
  Filled 2021-02-24: qty 0.7

## 2021-02-24 MED ORDER — IOHEXOL 350 MG/ML SOLN
95.0000 mL | Freq: Once | INTRAVENOUS | Status: AC | PRN
  Administered 2021-02-24: 95 mL via INTRAVENOUS

## 2021-02-24 NOTE — Assessment & Plan Note (Signed)
Restart meds slowly since he has been off them for 1-2 months.

## 2021-02-24 NOTE — H&P (Signed)
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  States that it woke him up from sleep.  Patient's had a cough.  He smokes cigarettes and marijuana.  Denies any other illicit drug use.  He initially told the ER provider that he has been out with his meds for about a month.  He states to me that he has been in [place] now for 2 months and now he has been out of his meds for 2 months.  When questioned why he did not ask his family and [place] to send of his medications, he states that he never thought of having his family mail him his medications.  Patient states that he takes Lasix, lisinopril and aspirin at home.  He describes the chest pain as constant.  Aggravated with inspiration.  He has had a productive cough.  No fevers.  Work-up in the ER is negative.  BNP is normal at 64.  Chemistry and CBC are unremarkable.  Troponins are negative x2.  EKG shows normal sinus rhythm without any acute ST changes.  ER states that cardiology wanted the patient to be admitted to the hospital for stress testing.  [facility] hospitalist contacted for admission.   ED Course: labs negative. Troponin negative x 2. EKG without acute ST changes  Review of Systems:  Review of Systems  Constitutional: Negative.  Negative for chills and fever.  HENT: Negative.    Eyes: Negative.   Respiratory:  Positive for cough, sputum production and shortness of breath.   Cardiovascular:  Positive for chest pain.   Gastrointestinal: Negative.   Genitourinary: Negative.   Musculoskeletal: Negative.   Skin: Negative.   Neurological: Negative.   Endo/Heme/Allergies: Negative.   Psychiatric/Behavioral:  Positive for substance abuse.   All other systems reviewed and are negative.  Past Medical History:  Diagnosis Date   Cerebral vascular accident (HCC) 05/2019   CHF (congestive heart failure) (HCC)    Essential hypertension 10/24/2014   Family history of premature CAD    NICM (nonischemic cardiomyopathy) (HCC)    a. 10/2014 Cath: nl cors, EF 35-45%, glob HK.   Obesity (BMI 30-39.9)    Prediabetes 06/2019   Vitamin B12 deficiency 05/2019   Vitamin D deficiency 06/2019    Past Surgical History:  Procedure Laterality Date   CARDIAC CATHETERIZATION N/A 10/25/2014   Procedure: Left Heart Cath and Coronary Angiography;  Surgeon: Lyn Records, MD;  Location: [facility] INVASIVE CV LAB;  Service: Cardiovascular;  Laterality: N/A;   None       reports that he has been smoking cigarettes. He has a 15.00 pack-year smoking history. He has never used smokeless tobacco. He reports current drug use. Drug: Marijuana. He reports that he does not drink alcohol.  Allergies  Allergen Reactions   Nyquil Multi-Symptom [Pseudoeph-Doxylamine-Dm-Apap] Nausea And Vomiting    Family History  Problem Relation Age of Onset   Heart attack Sister 60       Died in her sleep, dx at autopsy  Heart attack Cousin 5       Died in his sleep    Prior to Admission medications   Medication Sig Start Date End Date Taking? Authorizing Provider  albuterol (VENTOLIN HFA) 108 (90 Base) MCG/ACT inhaler Inhale 2 puffs into the lungs every 4 (four) hours as needed for wheezing or shortness of breath. 01/24/21  Yes Derwood Kaplan, MD  aspirin 81 MG chewable tablet Chew 1 tablet (81 mg total) by mouth daily. 03/19/19  Yes Hunter, [place], MD  atorvastatin (LIPITOR) 40 MG tablet Take 1 tablet (40 mg total) by mouth daily. 06/06/19  Yes  Alessandra Bevels, MD  furosemide (LASIX) 20 MG tablet Take 1 tablet (20 mg total) by mouth daily. 10/05/18  Yes Pollina, Canary Brim, MD  lisinopril (ZESTRIL) 20 MG tablet Take 1 tablet (20 mg total) by mouth daily. 06/29/19  Yes Kallie Locks, FNP  benzonatate (TESSALON) 100 MG capsule Take 1 capsule (100 mg total) by mouth every 8 (eight) hours. Patient not taking: Reported on 02/23/2021 01/24/21   Derwood Kaplan, MD  butalbital-acetaminophen-caffeine (FIORICET) 50-325-40 MG tablet Take 1 tablet by mouth every 12 (twelve) hours as needed for headache or migraine. Patient not taking: Reported on 02/23/2021 06/06/19   Alessandra Bevels, MD  cyclobenzaprine (FLEXERIL) 10 MG tablet Take 1 tablet (10 mg total) by mouth 3 (three) times daily as needed for muscle spasms. Patient not taking: Reported on 02/23/2021 06/10/20   Roxy Horseman, PA-C  ibuprofen (ADVIL) 800 MG tablet Take 1 tablet (800 mg total) by mouth 3 (three) times daily. Patient not taking: Reported on 02/23/2021 12/17/20   Theadora Rama Scales, PA-C  polyethylene glycol (MIRALAX / GLYCOLAX) 17 g packet Take 17 g by mouth daily as needed for mild constipation. Patient not taking: Reported on 02/23/2021 06/06/19   Alessandra Bevels, MD  famotidine (PEPCID) 20 MG tablet Take 1 tablet (20 mg total) by mouth 2 (two) times daily. Patient not taking: Reported on 10/05/2018 08/26/16 10/05/18  Joseph Art, DO    Physical Exam: Vitals:   02/23/21 1655 02/23/21 1800 02/23/21 1956 02/24/21 0058  BP: (!) 152/98  (!) 135/117 (!) 140/99  Pulse: 79  69 (!) 57  Resp: 20  (!) 22 18  Temp: 98.1 F (36.7 C)  98.3 F (36.8 C)   TempSrc: Oral  Oral   SpO2: 96%  96% 100%  Weight:  136 kg    Height:  5\' 8"  (1.727 m)      Physical Exam Vitals and nursing note reviewed.  Constitutional:      General: He is not in acute distress.    Appearance: Normal appearance. He is obese. He is not ill-appearing, toxic-appearing or diaphoretic.  HENT:     Head:  Normocephalic and atraumatic.     Nose: Nose normal. No rhinorrhea.  Eyes:     Comments: Bloodshot eyes  Cardiovascular:     Rate and Rhythm: Normal rate and regular rhythm.     Pulses: Normal pulses.  Pulmonary:     Comments: Scattered rhonchi that cleared with productive cough Abdominal:     General: Bowel sounds are normal. There is no distension.     Tenderness: There is no abdominal tenderness. There is no guarding or rebound.  Musculoskeletal:     Right lower leg: No edema.     Left lower leg: No edema.  Skin:    General: Skin is warm and dry.     Capillary Refill: Capillary refill takes less than 2  seconds.  Neurological:     General: No focal deficit present.     Mental Status: He is alert and oriented to person, place, and time.     Labs on Admission: I have personally reviewed following labs and imaging studies  CBC: Recent Labs  Lab 02/23/21 1800  WBC 7.8  HGB 16.5  HCT 51.4  MCV 86.2  PLT 198   Basic Metabolic Panel: Recent Labs  Lab 02/23/21 1800  NA 140  K 4.1  CL 104  CO2 28  GLUCOSE 100*  BUN 5*  CREATININE 1.08  CALCIUM 9.1   GFR: Estimated Creatinine Clearance: 105.5 mL/min (by C-G formula based on SCr of 1.08 mg/dL). Liver Function Tests: No results for input(s): AST, ALT, ALKPHOS, BILITOT, PROT, ALBUMIN in the last 168 hours. No results for input(s): LIPASE, AMYLASE in the last 168 hours. No results for input(s): AMMONIA in the last 168 hours. Coagulation Profile: No results for input(s): INR, PROTIME in the last 168 hours. Cardiac Enzymes: No results for input(s): CKTOTAL, CKMB, CKMBINDEX, TROPONINI in the last 168 hours. BNP (last 3 results) No results for input(s): PROBNP in the last 8760 hours. HbA1C: No results for input(s): HGBA1C in the last 72 hours. CBG: No results for input(s): GLUCAP in the last 168 hours. Lipid Profile: No results for input(s): CHOL, HDL, LDLCALC, TRIG, CHOLHDL, LDLDIRECT in the last 72 hours. Thyroid  Function Tests: No results for input(s): TSH, T4TOTAL, FREET4, T3FREE, THYROIDAB in the last 72 hours. Anemia Panel: No results for input(s): VITAMINB12, FOLATE, FERRITIN, TIBC, IRON, RETICCTPCT in the last 72 hours. Urine analysis:    Component Value Date/Time   COLORURINE YELLOW 05/26/2019 1446   APPEARANCEUR CLEAR 05/26/2019 1446   LABSPEC 1.018 05/26/2019 1446   PHURINE 6.0 05/26/2019 1446   GLUCOSEU NEGATIVE 05/26/2019 1446   HGBUR NEGATIVE 05/26/2019 1446   BILIRUBINUR NEGATIVE 05/26/2019 1446   KETONESUR NEGATIVE 05/26/2019 1446   PROTEINUR NEGATIVE 05/26/2019 1446   NITRITE NEGATIVE 05/26/2019 1446   LEUKOCYTESUR NEGATIVE 05/26/2019 1446    Radiological Exams on Admission: I have personally reviewed images DG Chest 2 View  Result Date: 02/23/2021 CLINICAL DATA:  Chest pain EXAM: CHEST - 2 VIEW COMPARISON:  01/24/2021 FINDINGS: Stable cardiomegaly. Pulmonary vasculature within normal limits. Minimally increased interstitial markings bilaterally. No lobar consolidation. No pleural effusion or pneumothorax. IMPRESSION: Stable cardiomegaly. Minimally increased interstitial markings bilaterally may reflect bronchitic type lung changes versus mild interstitial edema. Electronically Signed   By: Duanne Guess D.O.   On: 02/23/2021 18:54    EKG: I have personally reviewed EKG: NSR   Assessment/Plan Principal Problem:   Atypical chest pain Active Problems:   Chronic systolic CHF (congestive heart failure) (HCC)   Marijuana abuse   Non compliance with medical treatment   Essential hypertension   NICM (nonischemic cardiomyopathy) (HCC)    Assessment and Plan: * Atypical chest pain- (present on admission) Observation telemetry bed. Troponin negative x 2. Prior LHC in 2016 without CAD. EKG without ST changes. Pt states he doesn't drink etoh but his breath smells of ethanol. Will check serum alcohol level. Also check UDS. BNP is normal and CXR without pulmonary edema. EDP has  consulted cardiology. Pt does not think he can walk on treadmill. Will likely need Lexiscan.  Non compliance with medical treatment Pt states he has been without his meds for initially 1 month, and then 2 months. Pt states he came to [place] 2 months from [place]. Apparently it did not occur  to him to have his family send him his medications in the mail.  Marijuana abuse- (present on admission) Check UDS. Pt states he doesn't use any other illicit substances. Denies alcohol use, recently or in the past.. pt's breath smells of etoh. Check serum ethanol level  Chronic systolic CHF (congestive heart failure) (HCC)- (present on admission) Pt has reportedly been without his cardiac meds for 2 months. States he lives in [place], [place]. But is visiting family. When asked why he didn't have his family mail his medications to him, he said "because I was going to go back to [place]". He's been here for 2 months now and still doesn't have his meds. Pt's story changes throughout interview. He told EDP he didn't have his meds for only 1 month. Whatever the length of time he has been without meds, his BNP is normal and CXR without signs of CHF.  NICM (nonischemic cardiomyopathy) (HCC) Chronic.  Essential hypertension- (present on admission) Restart meds slowly since he has been off them for 1-2 months.   DVT prophylaxis: Lovenox Code Status: Full Code Family Communication: no family at bedside  Disposition Plan: return home  Consults called: EDP has consulted cardiology  Admission status: Observation, Telemetry bed   Carollee Herter, DO [facility] 02/24/2021, 1:08 AM

## 2021-02-24 NOTE — Subjective & Objective (Signed)
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  States that it woke him up from sleep.  Patient's had a cough.  He smokes cigarettes and marijuana.  Denies any other illicit drug use.  He initially told the ER provider that he has been out with his meds for about a month.  He states to me that he has been in [place] now for 2 months and now he has been out of his meds for 2 months.  When questioned why he did not ask his family and [place] to send of his medications, he states that he never thought of having his family mail him his medications.  Patient states that he takes Lasix, lisinopril and aspirin at home.  He describes the chest pain as constant.  Aggravated with inspiration.  He has had a productive cough.  No fevers.  Work-up in the ER is negative.  BNP is normal at 64.  Chemistry and CBC are unremarkable.  Troponins are negative x2.  EKG shows normal sinus rhythm without any acute ST changes.  ER states that cardiology wanted the patient to be admitted to the hospital for stress testing.  [facility] hospitalist contacted for admission.

## 2021-02-24 NOTE — Assessment & Plan Note (Signed)
Observation telemetry bed. Troponin negative x 2. Prior LHC in 2016 without CAD. EKG without ST changes. Pt states he doesn't drink etoh but his breath smells of ethanol. Will check serum alcohol level. Also check UDS. BNP is normal and CXR without pulmonary edema. EDP has consulted cardiology. Pt does not think he can walk on treadmill. Will likely need Lexiscan.

## 2021-02-24 NOTE — Progress Notes (Addendum)
Progress Note  Patient Name: Kenneth Gould Date of Encounter: 02/24/2021  [facility] Cardiologist: None   Subjective   Still with chest tightness, complains mostly of pain with deep inspiration. Hurts all over.   Inpatient Medications    Scheduled Meds:  amLODipine  10 mg Oral Daily   aspirin  81 mg Oral Daily   atorvastatin  40 mg Oral Daily   enoxaparin (LOVENOX) injection  70 mg Subcutaneous Q24H   nicotine  21 mg Transdermal Daily   Continuous Infusions:  PRN Meds: acetaminophen **OR** acetaminophen, albuterol, ondansetron **OR** ondansetron (ZOFRAN) IV   Vital Signs    Vitals:   02/23/21 1800 02/23/21 1956 02/24/21 0058 02/24/21 0738  BP:  (!) 135/117 (!) 140/99 (!) 154/107  Pulse:  69 (!) 57 80  Resp:  (!) 22 18 17   Temp:  98.3 F (36.8 C)    TempSrc:  Oral    SpO2:  96% 100% 96%  Weight: 136 kg     Height: 5\' 8"  (1.727 m)      No intake or output data in the 24 hours ending 02/24/21 0825 Last 3 Weights 02/23/2021 07/02/2019 06/29/2019  Weight (lbs) 299 lb 13.2 oz 300 lb 302 lb  Weight (kg) 136 kg 136.079 kg 136.986 kg      Telemetry    SR (on bedside monitor) - Personally Reviewed  ECG    No new tracing this morning  Physical Exam   GEN: No acute distress.   Neck: No JVD Cardiac: RRR, no murmurs, rubs, or gallops.  Respiratory: Expiratory wheezing throughout GI: Soft, nontender, non-distended  MS: No edema; No deformity. Neuro:  Nonfocal  Psych: Normal affect   Labs    High Sensitivity Troponin:   Recent Labs  Lab 02/23/21 1800 02/23/21 2131  TROPONINIHS 7 9     Chemistry Recent Labs  Lab 02/23/21 1800  NA 140  K 4.1  CL 104  CO2 28  GLUCOSE 100*  BUN 5*  CREATININE 1.08  CALCIUM 9.1  GFRNONAA >60  ANIONGAP 8    Lipids No results for input(s): CHOL, TRIG, HDL, LABVLDL, LDLCALC, CHOLHDL in the last 168 hours.  Hematology Recent Labs  Lab 02/23/21 1800  WBC 7.8  RBC 5.96*  HGB 16.5  HCT 51.4  MCV 86.2  MCH 27.7   MCHC 32.1  RDW 14.6  PLT 198   Thyroid No results for input(s): TSH, FREET4 in the last 168 hours.  BNP Recent Labs  Lab 02/23/21 2131  BNP 64.3    DDimer No results for input(s): DDIMER in the last 168 hours.   Radiology    DG Chest 2 View  Result Date: 02/23/2021 CLINICAL DATA:  Chest pain EXAM: CHEST - 2 VIEW COMPARISON:  01/24/2021 FINDINGS: Stable cardiomegaly. Pulmonary vasculature within normal limits. Minimally increased interstitial markings bilaterally. No lobar consolidation. No pleural effusion or pneumothorax. IMPRESSION: Stable cardiomegaly. Minimally increased interstitial markings bilaterally may reflect bronchitic type lung changes versus mild interstitial edema. Electronically Signed   By: Davina Poke D.O.   On: 02/23/2021 18:54    Cardiac Studies   N/a   Patient Profile     56 y.o. male with PMH of NICM (EF down to 40% with recovery to 50-55%), marijuana/tobacco abuse, HTN who presented with chest pain.   Assessment & Plan    Chest pain: hsTn negative. Atypical symptoms, hurts with deep breaths. Initially seen with plans for stress test as he had risk factors for CAD, though would  be better served with coronary CT. HR has been stable in the 70-80 range. Will give metoprolol 50mg  x1 now.  -- plan for coronary CT today  Hx of NICM: down to 40% in the past with recovery to 50-55%. No overt CHF symptoms. BNP 64.  -- on lisinopril 20mg  (PTA, but has been inconsistent with his meds) -- currently ordered for norvasc 10mg  daily   Congestion/cough: reports lingering viral illness for the past couple of weeks. Now with pleuritic chest pain.  -- check sed rate, CRP  HTN: has been inconsistent with his home meds (lives in [place] and travels back and forth to this area) -- blood pressures are elevated but had not received meds this morning  Polysubstance abuse: UDS pending  For questions or updates, please contact Alexis Please consult [email or web address] for  contact info under        Signed, Reino Bellis, NP  02/24/2021, 8:25 AM    I have examined the patient and reviewed assessment and plan and discussed with patient.  Agree with above as stated.    When I first saw him in the emergency room, he was asleep.  He was clearly having sleep apnea spells.  When I spoke to him, he mentioned that he continues to have chest pain with inspiration while awake.  He is able to fall asleep.  We will plan for CTA to evaluate for obstructive coronary artery disease.  Compliance with meds will be important given his history of nonischemic cardiomyopathy.  Larae Grooms

## 2021-02-24 NOTE — ED Notes (Signed)
Pt verbalized understanding of discharge paperwork and follow-up care.  °

## 2021-02-24 NOTE — Consult Note (Signed)
Cardiology Consultation:   Patient ID: Kenneth Gould MRN: [id number]; DOB: August 08, 1965  Admit date: 02/23/2021 Date of Consult: 02/24/2021  PCP:  Oneita Hurt, No   [facility] Providers Cardiologist:  None        Patient Profile:   Kenneth Gould is a 56 y.o. male with a hx of  nonischemic cardiomyopathy EF of 40% now recovered to 50 to 55%, history of marijuana abuse, hypertension  who is being seen 02/24/2021 for the evaluation of chest pain at the request of Dr. Imogene Burn  History of Present Illness:   Kenneth Gould is a 56 y.o. male with a hx of  nonischemic cardiomyopathy EF of 40% now recovered to 50 to 55%, history of marijuana abuse, hypertension  who is being seen 02/24/2021 for the evaluation of chest pain at the request of Dr. Imogene Burn. He presented with chest tightness since morning- pain is constant and does not radiate anywhere. He had an ER visit 2 weeks ago for N/V and flu like symptoms. Patient's had a cough.  He smokes cigarettes and marijuana.  Denies any other illicit drug use. The chest pain is Aggravated with inspiration.  He has had a productive cough.  No fevers. In the ED, BNP was 64 and troponin X2 were normal. Cardiology was consulted for considerable of unstable angina. His vital signs were stable in the ED.    Past Medical History:  Diagnosis Date   Cerebral vascular accident (HCC) 05/2019   CHF (congestive heart failure) (HCC)    Essential hypertension 10/24/2014   Family history of premature CAD    NICM (nonischemic cardiomyopathy) (HCC)    a. 10/2014 Cath: nl cors, EF 35-45%, glob HK.   Obesity (BMI 30-39.9)    Prediabetes 06/2019   Vitamin B12 deficiency 05/2019   Vitamin D deficiency 06/2019    Past Surgical History:  Procedure Laterality Date   CARDIAC CATHETERIZATION N/A 10/25/2014   Procedure: Left Heart Cath and Coronary Angiography;  Surgeon: Lyn Records, MD;  Location[facility] INVASIVE CV LAB;  Service: Cardiovascular;  Laterality: N/A;   None         Allergies:    Allergies  Allergen Reactions   Nyquil Multi-Symptom [Pseudoeph-Doxylamine-Dm-Apap] Nausea And Vomiting    Social History:   Social History   Socioeconomic History   Marital status: Married    Spouse name: Not on file   Number of children: Not on file   Years of education: Not on file   Highest education level: Not on file  Occupational History   Occupation: [employer] doctor  Tobacco Use   Smoking status: Every Day    Packs/day: 0.50    Years: 30.00    Pack years: 15.00    Types: Cigarettes    Last attempt to quit: 08/25/2015    Years since quitting: 5.5   Smokeless tobacco: Never  Vaping Use   Vaping Use: Never used  Substance and Sexual Activity   Alcohol use: No   Drug use: Yes    Types: Marijuana    Comment: THC   Sexual activity: Yes  Other Topics Concern   Not on file  Social History Narrative   Not on file   Social Determinants of Health   Financial Resource Strain: Not on file  Food Insecurity: Not on file  Transportation Needs: Not on file  Physical Activity: Not on file  Stress: Not on file  Social Connections: Not on file  Intimate Partner Violence: Not on file    Family History:  Family History  Problem Relation Age of Onset   Heart attack Sister 6       Died in her sleep, dx at autopsy   Heart attack Cousin 60       Died in his sleep     ROS:  Please see the history of present illness.  All other ROS reviewed and negative.     Physical Exam/Data:   Vitals:   02/23/21 1655 02/23/21 1800 02/23/21 1956 02/24/21 0058  BP: (!) 152/98  (!) 135/117 (!) 140/99  Pulse: 79  69 (!) 57  Resp: 20  (!) 22 18  Temp: 98.1 F (36.7 C)  98.3 F (36.8 C)   TempSrc: Oral  Oral   SpO2: 96%  96% 100%  Weight:  136 kg    Height:  5\' 8"  (1.727 m)     No intake or output data in the 24 hours ending 02/24/21 0522 Last 3 Weights 02/23/2021 07/02/2019 06/29/2019  Weight (lbs) 299 lb 13.2 oz 300 lb 302 lb  Weight (kg) 136 kg 136.079 kg  136.986 kg     Body mass index is 45.59 kg/m.  General:  Well nourished, well developed, in no acute distress HEENT: normal Neck: no JVD Vascular: No carotid bruits; Distal pulses 2+ bilaterally Cardiac:  normal S1, S2; RRR; no murmur  Lungs:  Poor airflow with rhonchi.  Abd: soft, nontender, no hepatomegaly  Ext: no edema Musculoskeletal:  No deformities, BUE and BLE strength normal and equal Skin: warm and dry  Neuro:  CNs 2-12 intact, no focal abnormalities noted Psych:  Normal affect   EKG:  The EKG was personally reviewed and demonstrates no ST elevation  Relevant CV Studies:  LHC  in 2016= Normal coronaries  Laboratory Data:  High Sensitivity Troponin:   Recent Labs  Lab 02/23/21 1800 02/23/21 2131  TROPONINIHS 7 9     Chemistry Recent Labs  Lab 02/23/21 1800  NA 140  K 4.1  CL 104  CO2 28  GLUCOSE 100*  BUN 5*  CREATININE 1.08  CALCIUM 9.1  GFRNONAA >60  ANIONGAP 8    No results for input(s): PROT, ALBUMIN, AST, ALT, ALKPHOS, BILITOT in the last 168 hours. Lipids No results for input(s): CHOL, TRIG, HDL, LABVLDL, LDLCALC, CHOLHDL in the last 168 hours.  Hematology Recent Labs  Lab 02/23/21 1800  WBC 7.8  RBC 5.96*  HGB 16.5  HCT 51.4  MCV 86.2  MCH 27.7  MCHC 32.1  RDW 14.6  PLT 198   Thyroid No results for input(s): TSH, FREET4 in the last 168 hours.  BNP Recent Labs  Lab 02/23/21 2131  BNP 64.3    DDimer No results for input(s): DDIMER in the last 168 hours.   Radiology/Studies:  DG Chest 2 View  Result Date: 02/23/2021 CLINICAL DATA:  Chest pain EXAM: CHEST - 2 VIEW COMPARISON:  01/24/2021 FINDINGS: Stable cardiomegaly. Pulmonary vasculature within normal limits. Minimally increased interstitial markings bilaterally. No lobar consolidation. No pleural effusion or pneumothorax. IMPRESSION: Stable cardiomegaly. Minimally increased interstitial markings bilaterally may reflect bronchitic type lung changes versus mild interstitial edema.  Electronically Signed   By: Duanne Guess D.O.   On: 02/23/2021 18:54     Assessment and Plan:   # Atypical Chest pain  # ?Angina  -Presentation is atypical. However has risk factors -Get Lexiscan stress test in AM -Echo in AM as well to assess for EF -Aspirin 81 mg -Atorvastatin 80 mg -Okay to hold off on heparin for  now as suspicion for ACS is low for now. Troponin X2 WNL, BNP 60.    ntact [facility] Please consult [email or web address] for contact info under    Signed, Hermelinda Dellen, MD  02/24/2021 5:22 AM

## 2021-02-24 NOTE — Discharge Summary (Signed)
Physician Discharge Summary   Patient: Kenneth Gould MRN: [id number] DOB: 1965-02-18  Admit date:     02/23/2021  Discharge date: 02/24/21  Discharge Physician: Tyrone Nine   PCP: Pcp, No   Recommendations at discharge:   Follow up with cardiology clinic. They will arrange follow up. 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  Discharge Diagnoses: Principal Problem:   Atypical chest pain Active Problems:   Essential hypertension   NICM (nonischemic cardiomyopathy) (HCC)   Chronic systolic CHF (congestive heart failure) (HCC)   Marijuana abuse   Non compliance with medical treatment  Hospital Course: Kenneth Gould is a 56 y.o. male with a history of NICM with LVEF improved from 40% to 50-55% most recently, marijuana use, HTN, and obesity who presented to the ED 2/6 with mid-left chest pressure/tightness that has been ongoing for about 3 days in the setting of being out of medications for about 2 months since he left [place] where he lives to be in [place]. ECG showed no acute ischemic changes, troponin normal x2, BNP normal, and other labs unremarkable. Cardiology was consulted and recommended stress testing and echocardiogram in the AM, so he was admitted at 1am today. Cardiology rounder recommended coronary CTA instead which showed no obstructive disease, calcium score zero, dilated ascending aorta. He is cleared for discharge.  Assessment and Plan: Atypical chest pain: Suspect MSK etiology with possible pleuritic component from chronic bronchitis without infiltrate on CXR. Cardiac enzymes reassuring, no recent changes in CHF symptoms, though would benefit from restarting home medications. Coronary CTA was reassuring, ACS is ruled out. The patient is cleared for discharge home. The problem-based assessment and plan from earlier this morning is included below.   * Atypical chest pain- (present on admission) Observation telemetry bed. Troponin negative x 2.  Prior LHC in 2016 without CAD. EKG without ST changes. Pt states he doesn't drink etoh but his breath smells of ethanol. Will check serum alcohol level. Also check UDS. BNP is normal and CXR without pulmonary edema. EDP has consulted cardiology. Pt does not think he can walk on treadmill. Will likely need Lexiscan.  Non compliance with medical treatment Pt states he has been without his meds for initially 1 month, and then 2 months. Pt states he came to [place] 2 months from [place]. Apparently it did not occur to him to have his family send him his medications in the mail.  Marijuana abuse- (present on admission) Check UDS. Pt states he doesn't use any other illicit substances. Denies alcohol use, recently or in the past.. pt's breath smells of etoh. Check serum ethanol level  Chronic systolic CHF (congestive heart failure) (HCC)- (present on admission) Pt has reportedly been without his cardiac meds for 2 months. States he lives in [place], [place]. But is visiting family. When asked why he didn't have his family mail his medications to him, he said "because I was going to go back to [place]". He's been here for 2 months now and still doesn't have his meds. Pt's story changes throughout interview. He told EDP he didn't have his meds for only 1 month. Whatever the length of time he has been without meds, his BNP is normal and CXR without signs of CHF.  NICM (nonischemic cardiomyopathy) (HCC) Chronic.  Essential hypertension- (present on admission) Restart meds slowly since he has been off them for 1-2 months.    Consultants: Cardiology, Dr. Eldridge Dace Procedures performed: Coronary CTA  Disposition: Home Diet recommendation:  Discharge Diet  Orders (From admission, onward)     Start     Ordered   02/24/21 0000  Diet - low sodium heart healthy        02/24/21 1709           Cardiac and Carb modified diet  DISCHARGE MEDICATION: Allergies as of 02/24/2021       Reactions   Nyquil  Multi-symptom [[id number]-[id number]] Nausea And Vomiting        Medication List     STOP taking these medications    butalbital-acetaminophen-caffeine 50-325-40 MG tablet Commonly known as: FIORICET   ibuprofen 800 MG tablet Commonly known as: ADVIL       TAKE these medications    albuterol 108 (90 Base) MCG/ACT inhaler Commonly known as: VENTOLIN HFA Inhale 2 puffs into the lungs every 4 (four) hours as needed for wheezing or shortness of breath.   aspirin 81 MG chewable tablet Chew 1 tablet (81 mg total) by mouth daily.   atorvastatin 40 MG tablet Commonly known as: LIPITOR Take 1 tablet (40 mg total) by mouth daily.   furosemide 20 MG tablet Commonly known as: LASIX Take 1 tablet (20 mg total) by mouth daily.   lisinopril 20 MG tablet Commonly known as: ZESTRIL Take 1 tablet (20 mg total) by mouth daily.        Follow-up Information     Kallie Locks, FNP Follow up.   Specialty: Family Medicine Contact information: [phone] [address] [place] [address] [phone]                 Discharge Exam: Ceasar Mons Weights   02/23/21 1800  Weight: 136 kg  Gen: No distress Pulm: Clear, distant without wheezes or crackles, and nonlabored on room air  CV: RRR, no murmur, no definite JVD, no edema GI: Soft, NT, ND, +BS  Neuro: Alert and oriented. No focal deficits. Skin: No rashes, lesions or ulcers on visualized skin   CXR: Interstitial prominence with stable cardiomegaly. ECG: NSR, vent rate 74, left axis, concordant t wave inversions in III, aVF, no ST elevations. Echo May 2021: LVEF stable at 50-55%, G1DD, mod LVH, inferior hypokinesis Cardiac Catheterization Oct 2016: Widely patent cors.  Condition at discharge: stable  The results of significant diagnostics from this hospitalization (including imaging, microbiology, ancillary and laboratory) are listed below for reference.   Imaging Studies: DG Chest 2 View  Result  Date: 02/23/2021 CLINICAL DATA:  Chest pain EXAM: CHEST - 2 VIEW COMPARISON:  01/24/2021 FINDINGS: Stable cardiomegaly. Pulmonary vasculature within normal limits. Minimally increased interstitial markings bilaterally. No lobar consolidation. No pleural effusion or pneumothorax. IMPRESSION: Stable cardiomegaly. Minimally increased interstitial markings bilaterally may reflect bronchitic type lung changes versus mild interstitial edema. Electronically Signed   By: Duanne Guess D.O.   On: 02/23/2021 18:54   CT CORONARY MORPH W/CTA COR W/SCORE W/CA W/CM &/OR WO/CM  Addendum Date: 02/24/2021   ADDENDUM REPORT: 02/24/2021 10:51 CLINICAL DATA:  Chest pain EXAM: Cardiac CTA MEDICATIONS: Sub lingual nitro. 4 mg and lopressor TECHNIQUE: The patient was scanned on a [employer] 192 scanner. Gantry rotation speed was 250 msecs. Collimation was. 6 mm . A 120 kV prospective scan was triggered in the ascending thoracic aorta at 140 HU's with full mA between 30-70% of the R-R interval . Average HR during the scan was 60 bpm. The 3D data set was interpreted on a dedicated work station using MPR, MIP and VRT modes. A total of 80 cc of  contrast was used. FINDINGS: Non-cardiac: See separate report from [facility]. No significant findings on limited lung and soft tissue windows. Calcium score: No calcium noted Coronary Arteries: Right dominant with no anomalies LM: Normal LAD: Normal D1: Normal large vessel D2: Normal D3: Normal Circumflex: Normal OM1: Normal OM2: Normal OM3: Normal RCA: Normal PDA: Normal PLA: Normal IMPRESSION: 1.  Calcium score 0 2.  Normal right dominant coronary arteries 3.  Dilated ascending thoracic aorta 3.9 cm Charlton Haws Electronically Signed   By: Charlton Haws M.D.   On: 02/24/2021 10:51   Result Date: 02/24/2021 EXAM: OVER-READ INTERPRETATION  CT CHEST The following report is an over-read performed by radiologist Dr. Jeronimo Greaves of [facility] on 02/24/2021. This over-read  does not include interpretation of cardiac or coronary anatomy or pathology. The coronary CTA interpretation by the cardiologist is attached. COMPARISON:  Chest radiograph from 1 day prior. CTA chest 11/07/2018 FINDINGS: Vascular: Normal aortic caliber. No central pulmonary embolism, on this non-dedicated study. Mediastinum/Nodes: No imaged thoracic adenopathy. Lungs/Pleura: No pleural fluid.  Clear imaged lungs. Upper Abdomen: Normal imaged portions of the liver, spleen, stomach. Musculoskeletal: No acute osseous abnormality. Mild right hemidiaphragm elevation. IMPRESSION: No acute findings in the imaged extracardiac chest. Electronically Signed: By: Jeronimo Greaves M.D. On: 02/24/2021 10:46    Microbiology: Results for orders placed or performed during the hospital encounter of 02/23/21  Resp Panel by RT-PCR (Flu A&B, Covid) Nasopharyngeal Swab     Status: None   Collection Time: 02/24/21  1:42 AM   Specimen: Nasopharyngeal Swab; Nasopharyngeal(NP) swabs in vial transport medium  Result Value Ref Range Status   SARS Coronavirus 2 by RT PCR NEGATIVE NEGATIVE Final    Comment: (NOTE) SARS-CoV-2 target nucleic acids are NOT DETECTED.  The SARS-CoV-2 RNA is generally detectable in upper respiratory specimens during the acute phase of infection. The lowest concentration of SARS-CoV-2 viral copies this assay can detect is 138 copies/mL. A negative result does not preclude SARS-Cov-2 infection and should not be used as the sole basis for treatment or other patient management decisions. A negative result may occur with  improper specimen collection/handling, submission of specimen other than nasopharyngeal swab, presence of viral mutation(s) within the areas targeted by this assay, and inadequate number of viral copies(<138 copies/mL). A negative result must be combined with clinical observations, patient history, and epidemiological information. The expected result is Negative.  Fact Sheet for  Patients:  BloggerCourse.com  Fact Sheet for Healthcare Providers:  SeriousBroker.it  This test is no t yet approved or cleared by the [place] FDA and  has been authorized for detection and/or diagnosis of SARS-CoV-2 by FDA under an Emergency Use Authorization (EUA). This EUA will remain  in effect (meaning this test can be used) for the duration of the COVID-19 declaration under Section 564(b)(1) of the Act, 21 U.S.C.section 360bbb-3(b)(1), unless the authorization is terminated  or revoked sooner.       Influenza A by PCR NEGATIVE NEGATIVE Final   Influenza B by PCR NEGATIVE NEGATIVE Final    Comment: (NOTE) The Xpert Xpress SARS-CoV-2/FLU/RSV plus assay is intended as an aid in the diagnosis of influenza from Nasopharyngeal swab specimens and should not be used as a sole basis for treatment. Nasal washings and aspirates are unacceptable for Xpert Xpress SARS-CoV-2/FLU/RSV testing.  Fact Sheet for Patients: BloggerCourse.com  Fact Sheet for Healthcare Providers: SeriousBroker.it  This test is not yet approved or cleared by the [place] and has been authorized  for detection and/or diagnosis of SARS-CoV-2 by FDA under an Emergency Use Authorization (EUA). This EUA will remain in effect (meaning this test can be used) for the duration of the COVID-19 declaration under Section 564(b)(1) of the Act, 21 U.S.C. section 360bbb-3(b)(1), unless the authorization is terminated or revoked.  Performed at [facility], [address], [place], [place] [address]     Labs: CBC: Recent Labs  Lab 02/23/21 1800  WBC 7.8  HGB 16.5  HCT 51.4  MCV 86.2  PLT 198   Basic Metabolic Panel: Recent Labs  Lab 02/23/21 1800  NA 140  K 4.1  CL 104  CO2 28  GLUCOSE 100*  BUN 5*  CREATININE 1.08  CALCIUM 9.1   Liver Function Tests: No results for input(s): AST,  ALT, ALKPHOS, BILITOT, PROT, ALBUMIN in the last 168 hours. CBG: No results for input(s): GLUCAP in the last 168 hours.  Discharge time spent: greater than 30 minutes.  Signed: Tyrone Nine, MD [facility] 02/24/2021

## 2021-02-24 NOTE — Progress Notes (Signed)
° ° °  Coronary CTA normal Calcium score was 0.  No CAD. No plan for any further cardiac testing.  Consider noncardiac sources of chest pain.  Corky Crafts, MD

## 2021-02-24 NOTE — Assessment & Plan Note (Signed)
Check UDS. Pt states he doesn't use any other illicit substances. Denies alcohol use, recently or in the past.. pt's breath smells of etoh. Check serum ethanol level

## 2021-02-24 NOTE — Assessment & Plan Note (Signed)
Chronic. 

## 2021-02-24 NOTE — Assessment & Plan Note (Signed)
Pt has reportedly been without his cardiac meds for 2 months. States he lives in [place], [place]. But is visiting family. When asked why he didn't have his family mail his medications to him, he said "because I was going to go back to [place]". He's been here for 2 months now and still doesn't have his meds. Pt's story changes throughout interview. He told EDP he didn't have his meds for only 1 month. Whatever the length of time he has been without meds, his BNP is normal and CXR without signs of CHF.

## 2021-02-24 NOTE — Assessment & Plan Note (Signed)
Pt states he has been without his meds for initially 1 month, and then 2 months. Pt states he came to [place] 2 months from [place]. Apparently it did not occur to him to have his family send him his medications in the mail.

## 2021-02-24 NOTE — Progress Notes (Signed)
PROGRESS NOTE  Brief Narrative: Kenneth Gould is a 56 y.o. male with a history of NICM with LVEF improved from 40% to 50-55% most recently, marijuana use, HTN, and obesity who presented to the ED 2/6 with mid-left chest pressure/tightness that has been ongoing for about 3 days in the setting of being out of medications for about 2 months since he left [place] where he lives to be in [place]. ECG showed no acute ischemic changes, troponin normal x2, BNP normal, and other labs unremarkable. Cardiology was consulted and recommended stress testing and echocardiogram in the AM, so he was admitted at 1am today.   Subjective: Sleeping soundly on the stretcher in the hallway, he reports his pain is unchanged, as it has been for days. Plans to return to [place] to get his medications. Can't get them filled here because medicaid isn't in effect here. No new problems, denies shortness of breath. Leg swelling is at its recent increased baseline.  Objective: BP (!) 140/99 (BP Location: Right Arm)    Pulse (!) 57    Temp 98.3 F (36.8 C) (Oral)    Resp 18    Ht 5\' 8"  (1.727 m)    Wt 136 kg    SpO2 100%    BMI 45.59 kg/m   Gen: No distress Pulm: Clear, distant without wheezes or crackles, and nonlabored on room air  CV: RRR, no murmur, no definite JVD, no edema GI: Soft, NT, ND, +BS  Neuro: Alert and oriented. No focal deficits. Skin: No rashes, lesions or ulcers on visualized skin  CXR: Interstitial prominence with stable cardiomegaly. ECG: NSR, vent rate 74, left axis, concordant t wave inversions in III, aVF, no ST elevations. Echo May 2021: LVEF stable at 50-55%, G1DD, mod LVH, inferior hypokinesis Cardiac Catheterization Oct 2016: Widely patent cors.  Assessment & Plan: Atypical chest pain: Suspect MSK etiology with possible pleuritic component from chronic bronchitis without infiltrate on CXR. Cardiac enzymes reassuring, no recent changes in CHF symptoms, though would benefit from  restarting home medications.  - Echo and stress test today is recommended by cardiology.  Nov 2016, MD Pager on [facility] 02/24/2021, 7:38 AM

## 2021-07-11 IMAGING — US US SCROTUM W/ DOPPLER COMPLETE
1 series · 13 of 25 positions shown · non-contrast
Comparison: March 19, 2019

CLINICAL DATA: Left scrotal pain and swelling

EXAM:
SCROTAL ULTRASOUND
DOPPLER ULTRASOUND OF THE TESTICLES
TECHNIQUE: Complete ultrasound examination of the testicles, epididymis, and
other scrotal structures was performed. Color and spectral Doppler
ultrasound were also utilized to evaluate blood flow to the
testicles.

[Series 1: us scrotum w/doppler · 13 of 64 slices shown]
[im 1/64]
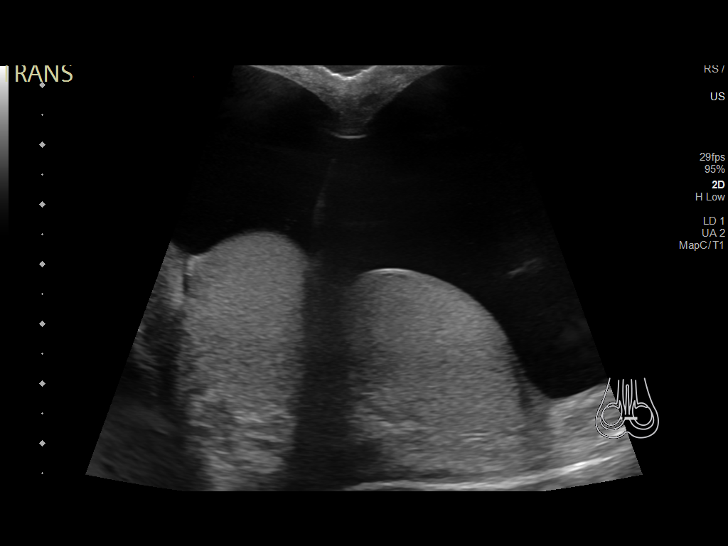
[im 6/64]
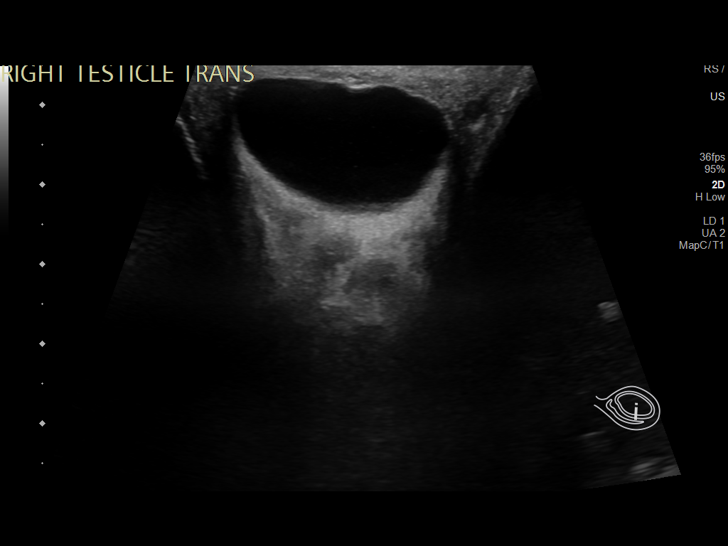
[im 11/64]
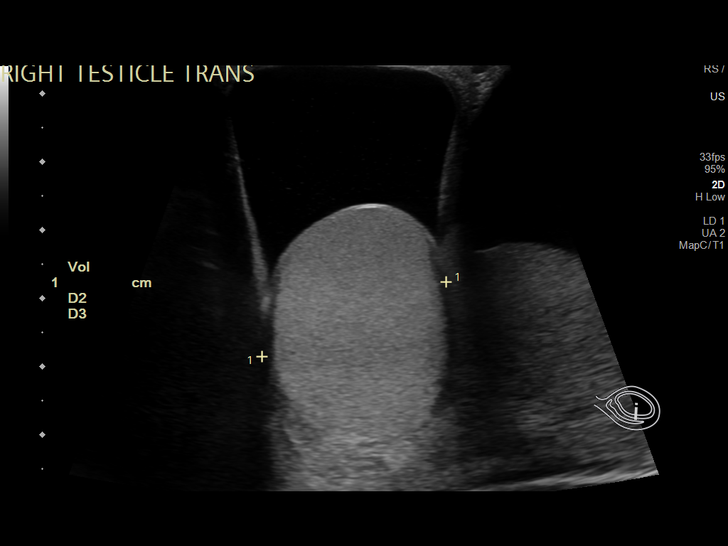
[im 16/64]
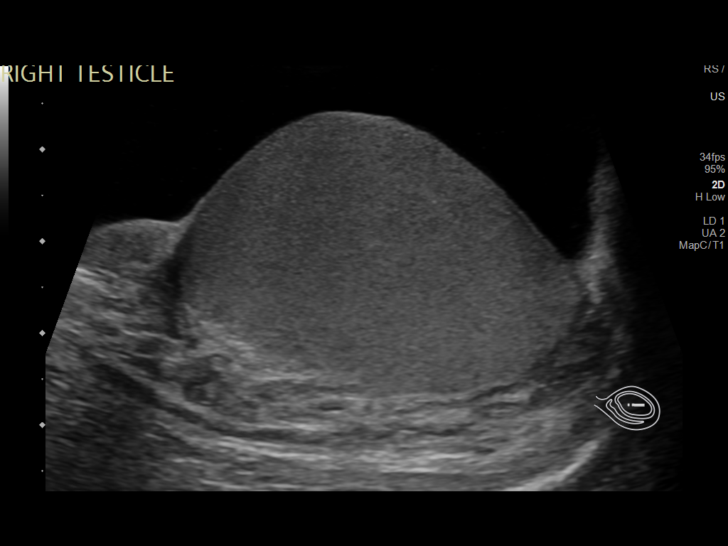
[im 22/64]
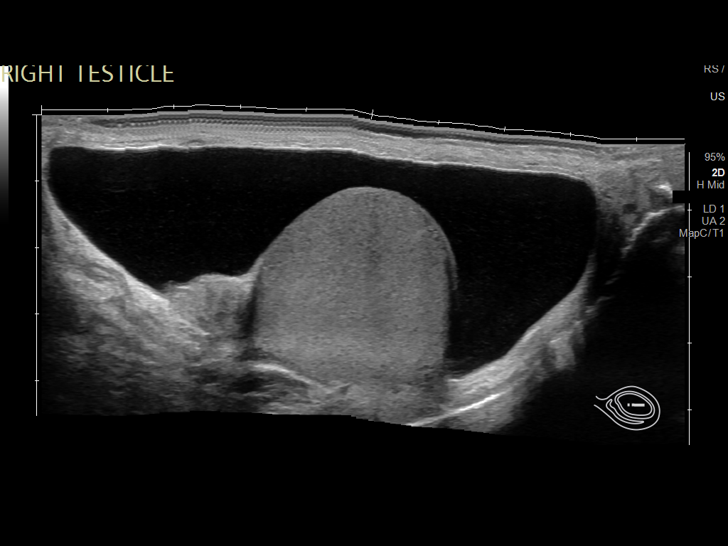
[im 27/64]
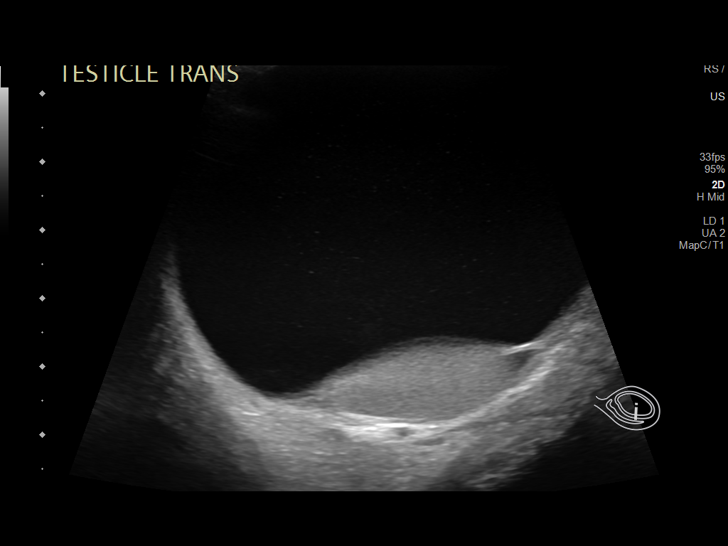
[im 32/64]
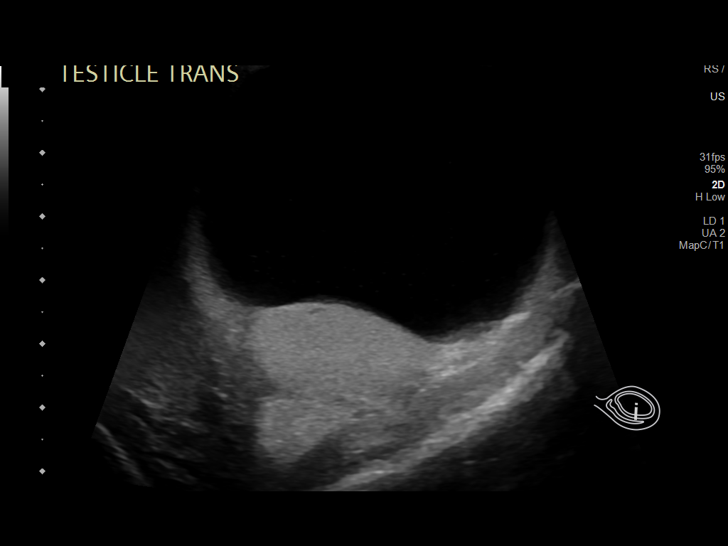
[im 37/64]
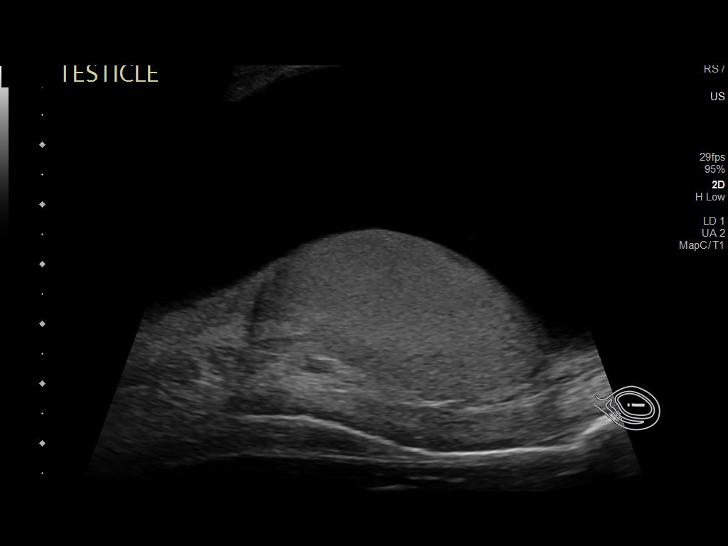
[im 43/64]
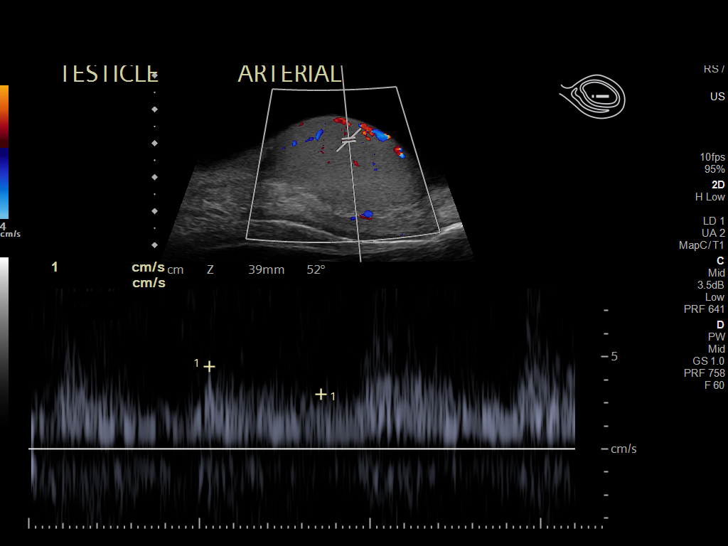
[im 48/64]
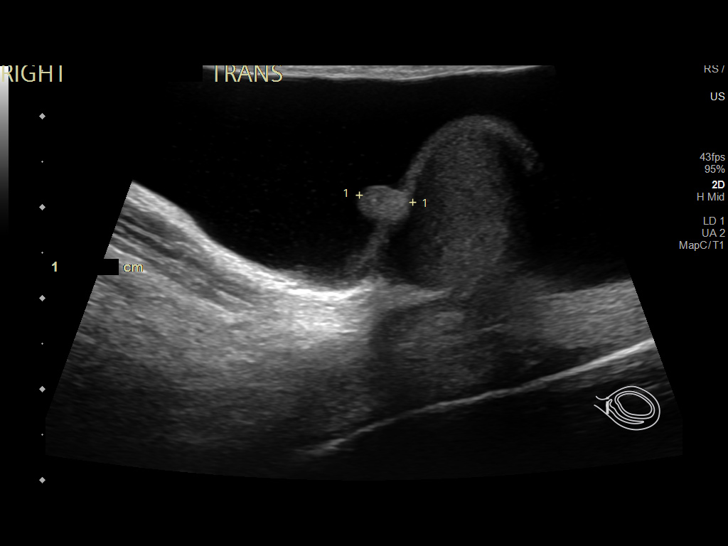
[im 53/64]
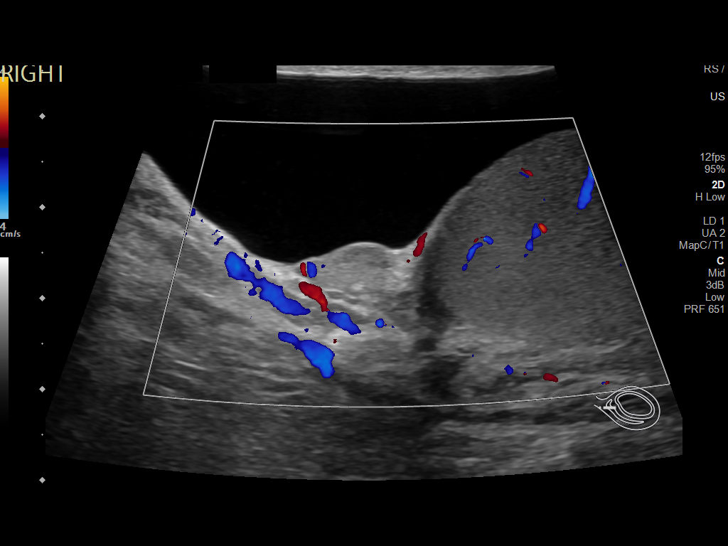
[im 58/64]
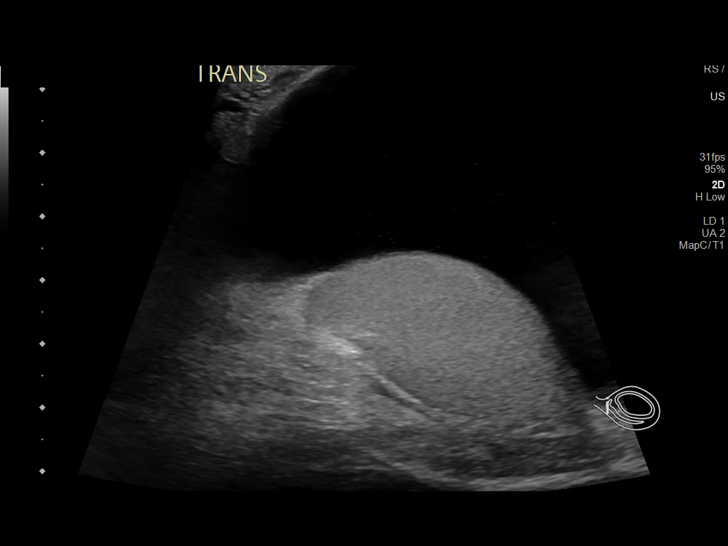
[im 64/64]
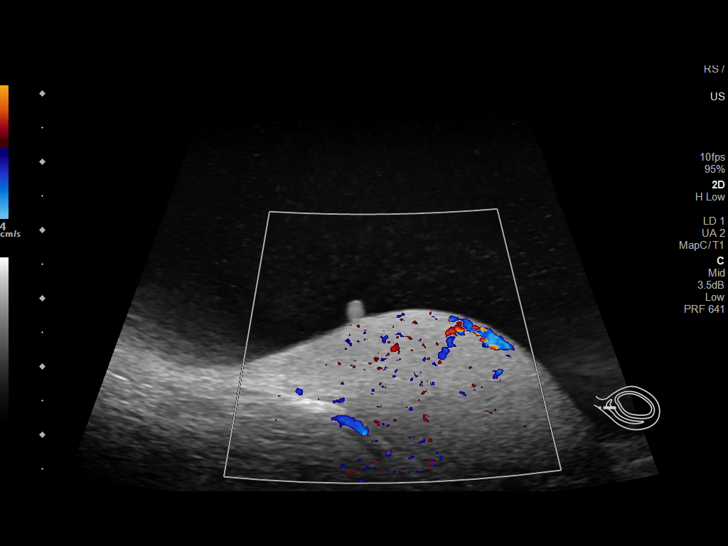

[13 of 25 positions shown; findings below may reference images not displayed]

FINDINGS: Right testicle

Measurements: 4.4 x 3.2 x 2.9 cm. No mass or microlithiasis
visualized.

Left testicle

Measurements: 4.9 x 2.8 x 3.6 cm. No mass or microlithiasis
visualized.

Right epididymis: Normal in size and appearance. No inflammatory
focus evident.

Left epididymis: Left epididymis is largely compressed by large
hydrocele. No mass or inflammatory focus in the epididymal region
evident.

Hydrocele: Large hydrocele on each side, larger on the left than on
the right.

Varicocele:  None visualized.

Pulsed Doppler interrogation of both testes demonstrates normal low
resistance arterial and venous waveforms bilaterally.

Normal appearing appendix testis noted on each side incidentally. No
scrotal abscess or scrotal wall thickening on either side.
IMPRESSION: Sizable hydroceles bilaterally, larger on the left than on the
right. No evident intratesticular or extratesticular mass on either
side. No testicular or epididymal inflammation on either side. No
testicular torsion on either side.
